# Patient Record
Sex: Female | Born: 1950 | Race: White | Hispanic: No | Marital: Single | Smoking: Current every day smoker
Health system: Southern US, Community
[De-identification: ages and names within clinical notes are randomized; demographics above are authoritative.]

## PROBLEM LIST (undated history)

## (undated) DIAGNOSIS — E7800 Pure hypercholesterolemia, unspecified: Secondary | ICD-10-CM

## (undated) DIAGNOSIS — M549 Dorsalgia, unspecified: Secondary | ICD-10-CM

## (undated) DIAGNOSIS — H9190 Unspecified hearing loss, unspecified ear: Secondary | ICD-10-CM

## (undated) DIAGNOSIS — K5792 Diverticulitis of intestine, part unspecified, without perforation or abscess without bleeding: Secondary | ICD-10-CM

## (undated) DIAGNOSIS — G2581 Restless legs syndrome: Secondary | ICD-10-CM

## (undated) DIAGNOSIS — F419 Anxiety disorder, unspecified: Secondary | ICD-10-CM

## (undated) DIAGNOSIS — Z9109 Other allergy status, other than to drugs and biological substances: Secondary | ICD-10-CM

## (undated) DIAGNOSIS — I10 Essential (primary) hypertension: Secondary | ICD-10-CM

## (undated) HISTORY — PX: ABDOMINAL HYSTERECTOMY: SHX81

## (undated) HISTORY — PX: APPENDECTOMY: SHX54

## (undated) HISTORY — PX: SHOULDER SURGERY: SHX246

## (undated) HISTORY — PX: TONSILLECTOMY: SUR1361

---

## 1998-11-06 ENCOUNTER — Emergency Department (HOSPITAL_COMMUNITY): Admission: EM | Admit: 1998-11-06 | Discharge: 1998-11-06 | Payer: Self-pay | Admitting: Emergency Medicine

## 2000-04-11 ENCOUNTER — Emergency Department (HOSPITAL_COMMUNITY): Admission: EM | Admit: 2000-04-11 | Discharge: 2000-04-11 | Payer: Self-pay | Admitting: Emergency Medicine

## 2000-07-04 ENCOUNTER — Other Ambulatory Visit: Admission: RE | Admit: 2000-07-04 | Discharge: 2000-07-04 | Payer: Self-pay | Admitting: *Deleted

## 2001-01-08 ENCOUNTER — Emergency Department (HOSPITAL_COMMUNITY): Admission: EM | Admit: 2001-01-08 | Discharge: 2001-01-08 | Payer: Self-pay | Admitting: Emergency Medicine

## 2001-01-08 ENCOUNTER — Encounter: Payer: Self-pay | Admitting: Emergency Medicine

## 2001-12-05 ENCOUNTER — Emergency Department (HOSPITAL_COMMUNITY): Admission: EM | Admit: 2001-12-05 | Discharge: 2001-12-05 | Payer: Self-pay | Admitting: Emergency Medicine

## 2002-05-27 ENCOUNTER — Encounter: Payer: Self-pay | Admitting: Emergency Medicine

## 2002-05-27 ENCOUNTER — Emergency Department (HOSPITAL_COMMUNITY): Admission: EM | Admit: 2002-05-27 | Discharge: 2002-05-27 | Payer: Self-pay | Admitting: Emergency Medicine

## 2002-06-23 ENCOUNTER — Encounter: Payer: Self-pay | Admitting: Internal Medicine

## 2002-06-23 ENCOUNTER — Encounter: Admission: RE | Admit: 2002-06-23 | Discharge: 2002-06-23 | Payer: Self-pay | Admitting: Internal Medicine

## 2002-10-08 ENCOUNTER — Encounter: Payer: Self-pay | Admitting: Emergency Medicine

## 2002-10-08 ENCOUNTER — Observation Stay (HOSPITAL_COMMUNITY): Admission: EM | Admit: 2002-10-08 | Discharge: 2002-10-09 | Payer: Self-pay | Admitting: Emergency Medicine

## 2004-04-29 ENCOUNTER — Emergency Department (HOSPITAL_COMMUNITY): Admission: EM | Admit: 2004-04-29 | Discharge: 2004-04-29 | Payer: Self-pay | Admitting: Emergency Medicine

## 2004-08-09 ENCOUNTER — Ambulatory Visit: Payer: Self-pay | Admitting: Internal Medicine

## 2005-01-01 ENCOUNTER — Emergency Department (HOSPITAL_COMMUNITY): Admission: EM | Admit: 2005-01-01 | Discharge: 2005-01-01 | Payer: Self-pay | Admitting: Family Medicine

## 2005-03-21 ENCOUNTER — Encounter (INDEPENDENT_AMBULATORY_CARE_PROVIDER_SITE_OTHER): Payer: Self-pay | Admitting: Nurse Practitioner

## 2005-03-28 ENCOUNTER — Ambulatory Visit: Payer: Self-pay | Admitting: Nurse Practitioner

## 2005-03-28 ENCOUNTER — Ambulatory Visit: Payer: Self-pay | Admitting: *Deleted

## 2005-03-31 ENCOUNTER — Ambulatory Visit (HOSPITAL_COMMUNITY): Admission: RE | Admit: 2005-03-31 | Discharge: 2005-03-31 | Payer: Self-pay | Admitting: Internal Medicine

## 2005-03-31 ENCOUNTER — Ambulatory Visit (HOSPITAL_COMMUNITY): Admission: RE | Admit: 2005-03-31 | Discharge: 2005-03-31 | Payer: Self-pay | Admitting: Family Medicine

## 2005-04-04 ENCOUNTER — Ambulatory Visit: Payer: Self-pay | Admitting: Nurse Practitioner

## 2005-05-16 ENCOUNTER — Ambulatory Visit: Payer: Self-pay | Admitting: Nurse Practitioner

## 2005-06-02 ENCOUNTER — Ambulatory Visit: Payer: Self-pay | Admitting: Nurse Practitioner

## 2005-07-28 ENCOUNTER — Ambulatory Visit: Payer: Self-pay | Admitting: Nurse Practitioner

## 2005-07-28 ENCOUNTER — Ambulatory Visit (HOSPITAL_COMMUNITY): Admission: RE | Admit: 2005-07-28 | Discharge: 2005-07-28 | Payer: Self-pay | Admitting: Internal Medicine

## 2005-11-15 ENCOUNTER — Encounter: Admission: RE | Admit: 2005-11-15 | Discharge: 2005-12-01 | Payer: Self-pay | Admitting: Neurosurgery

## 2006-02-23 ENCOUNTER — Ambulatory Visit: Payer: Self-pay | Admitting: Nurse Practitioner

## 2006-03-20 ENCOUNTER — Ambulatory Visit: Payer: Self-pay | Admitting: Nurse Practitioner

## 2006-05-08 ENCOUNTER — Ambulatory Visit: Payer: Self-pay | Admitting: Nurse Practitioner

## 2006-06-06 ENCOUNTER — Ambulatory Visit: Payer: Self-pay | Admitting: Nurse Practitioner

## 2006-06-27 ENCOUNTER — Emergency Department (HOSPITAL_COMMUNITY): Admission: EM | Admit: 2006-06-27 | Discharge: 2006-06-27 | Payer: Self-pay | Admitting: Emergency Medicine

## 2006-10-03 ENCOUNTER — Ambulatory Visit: Payer: Self-pay | Admitting: Nurse Practitioner

## 2006-10-05 ENCOUNTER — Encounter (INDEPENDENT_AMBULATORY_CARE_PROVIDER_SITE_OTHER): Payer: Self-pay | Admitting: Nurse Practitioner

## 2006-10-05 LAB — CONVERTED CEMR LAB: TSH: 1.402 u[IU]/mL

## 2006-10-30 ENCOUNTER — Ambulatory Visit: Payer: Self-pay | Admitting: Nurse Practitioner

## 2007-04-04 ENCOUNTER — Encounter (INDEPENDENT_AMBULATORY_CARE_PROVIDER_SITE_OTHER): Payer: Self-pay | Admitting: Nurse Practitioner

## 2007-04-04 DIAGNOSIS — E785 Hyperlipidemia, unspecified: Secondary | ICD-10-CM | POA: Insufficient documentation

## 2007-04-04 DIAGNOSIS — M545 Low back pain, unspecified: Secondary | ICD-10-CM | POA: Insufficient documentation

## 2007-04-04 DIAGNOSIS — F329 Major depressive disorder, single episode, unspecified: Secondary | ICD-10-CM | POA: Insufficient documentation

## 2007-04-04 DIAGNOSIS — F411 Generalized anxiety disorder: Secondary | ICD-10-CM | POA: Insufficient documentation

## 2007-04-04 DIAGNOSIS — I10 Essential (primary) hypertension: Secondary | ICD-10-CM | POA: Insufficient documentation

## 2007-04-04 DIAGNOSIS — IMO0002 Reserved for concepts with insufficient information to code with codable children: Secondary | ICD-10-CM | POA: Insufficient documentation

## 2007-04-04 DIAGNOSIS — F3289 Other specified depressive episodes: Secondary | ICD-10-CM | POA: Insufficient documentation

## 2007-05-08 ENCOUNTER — Encounter (INDEPENDENT_AMBULATORY_CARE_PROVIDER_SITE_OTHER): Payer: Self-pay | Admitting: *Deleted

## 2007-08-06 ENCOUNTER — Ambulatory Visit: Payer: Self-pay | Admitting: Family Medicine

## 2007-08-06 ENCOUNTER — Encounter (INDEPENDENT_AMBULATORY_CARE_PROVIDER_SITE_OTHER): Payer: Self-pay | Admitting: Nurse Practitioner

## 2007-08-06 LAB — CONVERTED CEMR LAB
AST: 29 units/L (ref 0–37)
Albumin: 4.7 g/dL (ref 3.5–5.2)
Alkaline Phosphatase: 69 units/L (ref 39–117)
Basophils Absolute: 0 10*3/uL (ref 0.0–0.1)
Basophils Relative: 0 % (ref 0–1)
Eosinophils Absolute: 0.2 10*3/uL (ref 0.2–0.7)
LDL Cholesterol: 122 mg/dL — ABNORMAL HIGH (ref 0–99)
MCHC: 33 g/dL (ref 30.0–36.0)
MCV: 95.7 fL (ref 78.0–100.0)
Neutrophils Relative %: 39 % — ABNORMAL LOW (ref 43–77)
Platelets: 412 10*3/uL — ABNORMAL HIGH (ref 150–400)
Potassium: 3.9 meq/L (ref 3.5–5.3)
RDW: 13.3 % (ref 11.5–15.5)
Sodium: 143 meq/L (ref 135–145)
TSH: 1.294 microintl units/mL (ref 0.350–5.50)
Total Bilirubin: 0.6 mg/dL (ref 0.3–1.2)
Total Protein: 7.4 g/dL (ref 6.0–8.3)
VLDL: 26 mg/dL (ref 0–40)
WBC: 10.7 10*3/uL — ABNORMAL HIGH (ref 4.0–10.5)

## 2007-09-04 ENCOUNTER — Encounter (INDEPENDENT_AMBULATORY_CARE_PROVIDER_SITE_OTHER): Payer: Self-pay | Admitting: Nurse Practitioner

## 2007-09-04 ENCOUNTER — Ambulatory Visit: Payer: Self-pay | Admitting: Family Medicine

## 2008-03-06 ENCOUNTER — Ambulatory Visit: Payer: Self-pay | Admitting: Internal Medicine

## 2008-09-07 ENCOUNTER — Ambulatory Visit (HOSPITAL_COMMUNITY): Admission: RE | Admit: 2008-09-07 | Discharge: 2008-09-07 | Payer: Self-pay | Admitting: Family Medicine

## 2008-10-05 ENCOUNTER — Telehealth: Payer: Self-pay | Admitting: Family Medicine

## 2009-09-20 ENCOUNTER — Ambulatory Visit (HOSPITAL_COMMUNITY): Admission: RE | Admit: 2009-09-20 | Discharge: 2009-09-20 | Payer: Self-pay | Admitting: Family Medicine

## 2010-09-13 ENCOUNTER — Encounter: Payer: Self-pay | Admitting: Orthopedic Surgery

## 2010-09-15 ENCOUNTER — Other Ambulatory Visit (HOSPITAL_COMMUNITY): Payer: Self-pay | Admitting: Family Medicine

## 2010-09-15 DIAGNOSIS — Z139 Encounter for screening, unspecified: Secondary | ICD-10-CM

## 2010-09-22 ENCOUNTER — Ambulatory Visit (HOSPITAL_COMMUNITY)
Admission: RE | Admit: 2010-09-22 | Discharge: 2010-09-22 | Disposition: A | Discharge status: Home / Self Care | Payer: Medicare Other | Source: Ambulatory Visit | Attending: Family Medicine | Admitting: Family Medicine

## 2010-09-22 DIAGNOSIS — Z139 Encounter for screening, unspecified: Secondary | ICD-10-CM

## 2010-09-22 DIAGNOSIS — Z1231 Encounter for screening mammogram for malignant neoplasm of breast: Secondary | ICD-10-CM | POA: Insufficient documentation

## 2010-09-23 ENCOUNTER — Other Ambulatory Visit (HOSPITAL_COMMUNITY): Payer: Self-pay | Admitting: Family Medicine

## 2010-09-28 NOTE — Letter (Signed)
Summary: FMLA form  FMLA form   Imported By: Cammie Sickle 09/23/2010 14:38:15  _____________________________________________________________________  External Attachment:    Type:   Image     Comment:   External Document

## 2010-10-06 ENCOUNTER — Ambulatory Visit (HOSPITAL_COMMUNITY): Admission: RE | Admit: 2010-10-06 | Payer: Medicare Other | Source: Ambulatory Visit

## 2010-10-18 ENCOUNTER — Ambulatory Visit (HOSPITAL_COMMUNITY): Admission: RE | Admit: 2010-10-18 | Payer: Medicare Other | Source: Ambulatory Visit

## 2010-10-20 ENCOUNTER — Inpatient Hospital Stay (HOSPITAL_COMMUNITY): Admission: RE | Admit: 2010-10-20 | Payer: Medicare Other | Source: Ambulatory Visit

## 2011-01-06 NOTE — H&P (Signed)
NAME:  Laura Bradshaw, Laura Bradshaw NO.:  [id number]   MEDICAL RECORD NO.:  [id number]                   PATIENT TYPE:  EMS   LOCATION:  ED                                   FACILITY:  [facility]   PHYSICIAN:  Gordy Savers, M.D. [facility]      DATE OF BIRTH:  07-27-1951   DATE OF ADMISSION:  10/08/2002  DATE OF DISCHARGE:                                HISTORY & PHYSICAL   CHIEF COMPLAINT:  Chest pain.   HISTORY OF PRESENT ILLNESS:  The patient is a 61 year old white female who  was well until 4-5 days ago. At that time, she developed some chest  discomfort late one evening after consuming pizza earlier. She attributed  this to indigestion and took antacids with very little relief. The following  morning, she awoke and felt what she described as achy. She felt that she  had a flu syndrome and began taking Robitussin as well as Tylenol. Later in  the day, she developed discomfort in the lower chest and upper abdominal  area described as a band like sensation. This also spread to involve the  lower back area. The pain at the time was quite uncomfortable and tended to  be aggravating with certain movements such as twisting and bending. She  denied any associated symptoms such as nausea or shortness of breath. Over  the past couple of days, she has had recurrent similar chest pain. On the  day of admission, the patient became quite uncomfortable with more  generalized pain. Again this was described as an intense feeling of  discomfort pressure type sensation involving again more the lower chest,  flank and lower back area. Denied any real substernal chest pain or  associated symptoms. Because of her sense of intense discomfort, she  presented to the emergency department where she did receive two sublingual  nitroglycerin that seemed to improve the pain considerably. At the time of  this interview, the patient had some very minor discomfort in the left lower  back area.  Evaluation included an EKG that was normal and normal initial  cardiac enzymes. The patient is now admitted for further evaluation and  treatment of her atypical chest pain.   The patient denies any known prior history of cardiac disease. She is a  longtime one pack per day cigarette smoker and has been told that perhaps  she had a borderline elevated cholesterol level. There is no history of  hypertension or diabetes.   PAST MEDICAL HISTORY:  The patient has a history of diverticulitis that has  required outpatient antibiotic treatment. Other problems including ongoing  tobacco use.   PAST SURGICAL HISTORY:  Surgical procedures have included remote  hysterectomy in 1984 due to endometriosis. She has had a tonsillectomy at  age 54 and an appendectomy at age 65.   MEDICATIONS:  She takes no chronic medications. Has been taking Robitussin  and antacids for her current illness.  SOCIAL HISTORY:  She presently is unemployed, is accompanied by three  daughters to the emergency department. One pack per day smoker.   FAMILY HISTORY:  Father died at 66 with a history of coronary artery  disease. He had diabetes and died of renal failure. Mother age 16 without  known heart disease. Several other siblings, however, with documented  coronary artery disease. Two brothers and two sisters are living and well.   PHYSICAL EXAMINATION:  VITAL SIGNS:  Revealed a blood pressure of 130/88,  pulse rate 82, respiratory rate 18. Pulse oximetry was 98 on room air.  SKIN:  Warm and dry without rash.  GENERAL:  A well-developed, healthy appearing white female in no acute  distress.  HEENT:  Revealed normal pupil responses, conjunctivae clear. No arcus  senilis noted. Ear, nose and throat normal.  NECK:  No neck vein distention, no bruits.  CHEST:  Clear.  CARDIOVASCULAR:  S1 and S2 are normal with murmurs or gallops.  ABDOMEN:  Soft and nontender, no organomegaly. No epigastric tenderness.  Bowel sounds  were active.  EXTREMITIES:  Revealed full peripheral pulses without edema.  NEUROLOGIC:  Negative.   IMPRESSION:  Atypical chest pain, rule out coronary artery disease.   DISPOSITION:  The patient was admitted to the hospital, serial cardiac  enzymes and EKGs will be reviewed. The patient will be considered for a  stress test on October 09, 2002 and possible early discharge if negative.                                               Gordy Savers, M.D. [facility]    PFK/MEDQ  D:  10/08/2002  T:  10/08/2002  Job:  [id number]

## 2011-11-08 ENCOUNTER — Other Ambulatory Visit (HOSPITAL_COMMUNITY): Payer: Self-pay | Admitting: Nurse Practitioner

## 2011-11-08 ENCOUNTER — Other Ambulatory Visit (HOSPITAL_COMMUNITY): Payer: Self-pay | Admitting: Family Medicine

## 2011-11-08 DIAGNOSIS — Z1231 Encounter for screening mammogram for malignant neoplasm of breast: Secondary | ICD-10-CM

## 2011-11-14 ENCOUNTER — Other Ambulatory Visit (HOSPITAL_COMMUNITY): Payer: Self-pay | Admitting: Nurse Practitioner

## 2011-12-05 ENCOUNTER — Ambulatory Visit (HOSPITAL_COMMUNITY): Payer: Medicare Other | Attending: Nurse Practitioner

## 2011-12-05 ENCOUNTER — Ambulatory Visit (HOSPITAL_COMMUNITY)
Admission: RE | Admit: 2011-12-05 | Payer: Medicare Other | Source: Ambulatory Visit | Attending: Family Medicine | Admitting: Family Medicine

## 2012-09-12 ENCOUNTER — Ambulatory Visit (HOSPITAL_COMMUNITY): Payer: Medicare Other

## 2012-09-19 ENCOUNTER — Ambulatory Visit (HOSPITAL_COMMUNITY): Payer: Medicare Other

## 2012-09-19 ENCOUNTER — Ambulatory Visit (HOSPITAL_COMMUNITY): Admission: RE | Admit: 2012-09-19 | Payer: Medicare Other | Source: Ambulatory Visit

## 2012-10-01 ENCOUNTER — Ambulatory Visit (HOSPITAL_COMMUNITY): Payer: Medicare Other

## 2012-10-01 ENCOUNTER — Ambulatory Visit (HOSPITAL_COMMUNITY): Admission: RE | Admit: 2012-10-01 | Payer: Medicare Other | Source: Ambulatory Visit

## 2013-03-27 ENCOUNTER — Other Ambulatory Visit (HOSPITAL_COMMUNITY): Payer: Self-pay | Admitting: Family Medicine

## 2013-03-27 DIAGNOSIS — Z780 Asymptomatic menopausal state: Secondary | ICD-10-CM

## 2013-03-27 DIAGNOSIS — Z1231 Encounter for screening mammogram for malignant neoplasm of breast: Secondary | ICD-10-CM

## 2013-04-01 ENCOUNTER — Ambulatory Visit (HOSPITAL_COMMUNITY)
Admission: RE | Admit: 2013-04-01 | Discharge: 2013-04-01 | Disposition: A | Discharge status: Home / Self Care | Payer: Medicare Other | Source: Ambulatory Visit | Attending: Family Medicine | Admitting: Family Medicine

## 2013-04-01 DIAGNOSIS — Z780 Asymptomatic menopausal state: Secondary | ICD-10-CM | POA: Insufficient documentation

## 2013-04-01 DIAGNOSIS — Z1231 Encounter for screening mammogram for malignant neoplasm of breast: Secondary | ICD-10-CM

## 2013-04-01 DIAGNOSIS — Z13820 Encounter for screening for osteoporosis: Secondary | ICD-10-CM | POA: Insufficient documentation

## 2014-06-03 ENCOUNTER — Other Ambulatory Visit (HOSPITAL_COMMUNITY): Payer: Self-pay | Admitting: Nurse Practitioner

## 2014-06-03 DIAGNOSIS — Z1231 Encounter for screening mammogram for malignant neoplasm of breast: Secondary | ICD-10-CM

## 2014-06-11 ENCOUNTER — Ambulatory Visit (HOSPITAL_COMMUNITY): Payer: Medicare Other

## 2014-06-16 ENCOUNTER — Ambulatory Visit (HOSPITAL_COMMUNITY)
Admission: RE | Admit: 2014-06-16 | Discharge: 2014-06-16 | Disposition: A | Discharge status: Home / Self Care | Payer: Medicare Other | Source: Ambulatory Visit | Attending: Nurse Practitioner | Admitting: Nurse Practitioner

## 2014-06-16 DIAGNOSIS — Z1231 Encounter for screening mammogram for malignant neoplasm of breast: Secondary | ICD-10-CM

## 2015-10-07 ENCOUNTER — Other Ambulatory Visit: Payer: Self-pay | Admitting: Nurse Practitioner

## 2015-10-07 DIAGNOSIS — Z1231 Encounter for screening mammogram for malignant neoplasm of breast: Secondary | ICD-10-CM

## 2015-10-07 DIAGNOSIS — E2839 Other primary ovarian failure: Secondary | ICD-10-CM

## 2015-11-24 ENCOUNTER — Other Ambulatory Visit: Payer: Medicare Other

## 2015-11-24 ENCOUNTER — Ambulatory Visit: Payer: Medicare Other

## 2016-01-06 ENCOUNTER — Ambulatory Visit
Admission: RE | Admit: 2016-01-06 | Discharge: 2016-01-06 | Disposition: A | Discharge status: Home / Self Care | Payer: Medicare Other | Source: Ambulatory Visit | Attending: Nurse Practitioner | Admitting: Nurse Practitioner

## 2016-01-06 DIAGNOSIS — E2839 Other primary ovarian failure: Secondary | ICD-10-CM

## 2016-01-06 DIAGNOSIS — Z1231 Encounter for screening mammogram for malignant neoplasm of breast: Secondary | ICD-10-CM

## 2017-01-31 ENCOUNTER — Other Ambulatory Visit: Payer: Self-pay | Admitting: Nurse Practitioner

## 2017-01-31 DIAGNOSIS — E2839 Other primary ovarian failure: Secondary | ICD-10-CM

## 2017-01-31 DIAGNOSIS — Z1231 Encounter for screening mammogram for malignant neoplasm of breast: Secondary | ICD-10-CM

## 2017-03-05 ENCOUNTER — Ambulatory Visit
Admission: RE | Admit: 2017-03-05 | Discharge: 2017-03-05 | Disposition: A | Discharge status: Home / Self Care | Payer: Medicare Other | Source: Ambulatory Visit | Attending: Nurse Practitioner | Admitting: Nurse Practitioner

## 2017-03-05 DIAGNOSIS — Z1231 Encounter for screening mammogram for malignant neoplasm of breast: Secondary | ICD-10-CM

## 2017-03-05 DIAGNOSIS — E2839 Other primary ovarian failure: Secondary | ICD-10-CM

## 2018-01-06 ENCOUNTER — Emergency Department (HOSPITAL_BASED_OUTPATIENT_CLINIC_OR_DEPARTMENT_OTHER): Payer: Medicare Other

## 2018-01-06 ENCOUNTER — Emergency Department (HOSPITAL_BASED_OUTPATIENT_CLINIC_OR_DEPARTMENT_OTHER)
Admission: EM | Admit: 2018-01-06 | Discharge: 2018-01-06 | Disposition: A | Discharge status: Home / Self Care | Payer: Medicare Other | Attending: Emergency Medicine | Admitting: Emergency Medicine

## 2018-01-06 ENCOUNTER — Encounter (HOSPITAL_BASED_OUTPATIENT_CLINIC_OR_DEPARTMENT_OTHER): Payer: Self-pay | Admitting: Emergency Medicine

## 2018-01-06 ENCOUNTER — Other Ambulatory Visit: Payer: Self-pay

## 2018-01-06 DIAGNOSIS — I10 Essential (primary) hypertension: Secondary | ICD-10-CM | POA: Insufficient documentation

## 2018-01-06 DIAGNOSIS — J40 Bronchitis, not specified as acute or chronic: Secondary | ICD-10-CM

## 2018-01-06 DIAGNOSIS — M545 Low back pain: Secondary | ICD-10-CM

## 2018-01-06 DIAGNOSIS — Z79899 Other long term (current) drug therapy: Secondary | ICD-10-CM | POA: Insufficient documentation

## 2018-01-06 DIAGNOSIS — F17210 Nicotine dependence, cigarettes, uncomplicated: Secondary | ICD-10-CM | POA: Diagnosis not present

## 2018-01-06 DIAGNOSIS — R05 Cough: Secondary | ICD-10-CM | POA: Diagnosis not present

## 2018-01-06 DIAGNOSIS — E041 Nontoxic single thyroid nodule: Secondary | ICD-10-CM | POA: Diagnosis not present

## 2018-01-06 DIAGNOSIS — Z7982 Long term (current) use of aspirin: Secondary | ICD-10-CM | POA: Diagnosis not present

## 2018-01-06 DIAGNOSIS — R1032 Left lower quadrant pain: Secondary | ICD-10-CM | POA: Diagnosis present

## 2018-01-06 HISTORY — DX: Pure hypercholesterolemia, unspecified: E78.00

## 2018-01-06 HISTORY — DX: Unspecified hearing loss, unspecified ear: H91.90

## 2018-01-06 HISTORY — DX: Essential (primary) hypertension: I10

## 2018-01-06 HISTORY — DX: Other allergy status, other than to drugs and biological substances: Z91.09

## 2018-01-06 HISTORY — DX: Dorsalgia, unspecified: M54.9

## 2018-01-06 HISTORY — DX: Restless legs syndrome: G25.81

## 2018-01-06 HISTORY — DX: Anxiety disorder, unspecified: F41.9

## 2018-01-06 HISTORY — DX: Diverticulitis of intestine, part unspecified, without perforation or abscess without bleeding: K57.92

## 2018-01-06 LAB — COMPREHENSIVE METABOLIC PANEL
ALBUMIN: 4.4 g/dL (ref 3.5–5.0)
ALK PHOS: 63 U/L (ref 38–126)
ALT: 28 U/L (ref 14–54)
AST: 25 U/L (ref 15–41)
Anion gap: 11 (ref 5–15)
BUN: 14 mg/dL (ref 6–20)
CALCIUM: 10.2 mg/dL (ref 8.9–10.3)
CO2: 22 mmol/L (ref 22–32)
CREATININE: 0.71 mg/dL (ref 0.44–1.00)
Chloride: 104 mmol/L (ref 101–111)
GFR calc Af Amer: >60 mL/min (ref >60)
GFR calc non Af Amer: >60 mL/min (ref >60)
GLUCOSE: 110 mg/dL — AB (ref 65–99)
Potassium: 3.7 mmol/L (ref 3.5–5.1)
SODIUM: 137 mmol/L (ref 135–145)
Total Bilirubin: 0.4 mg/dL (ref 0.3–1.2)
Total Protein: 7.2 g/dL (ref 6.5–8.1)

## 2018-01-06 LAB — CBC WITH DIFFERENTIAL/PLATELET
Basophils Absolute: 0 10*3/uL (ref 0.0–0.1)
Basophils Relative: 0 %
EOS ABS: 0.3 10*3/uL (ref 0.0–0.7)
Eosinophils Relative: 3 %
HCT: 40.6 % (ref 36.0–46.0)
HEMOGLOBIN: 14.1 g/dL (ref 12.0–15.0)
Lymphocytes Relative: 28 %
Lymphs Abs: 2.7 10*3/uL (ref 0.7–4.0)
MCH: 32.3 pg (ref 26.0–34.0)
MCHC: 34.7 g/dL (ref 30.0–36.0)
MCV: 93.1 fL (ref 78.0–100.0)
Monocytes Absolute: 0.9 10*3/uL (ref 0.1–1.0)
Monocytes Relative: 9 %
NEUTROS ABS: 5.8 10*3/uL (ref 1.7–7.7)
NEUTROS PCT: 60 %
Platelets: 402 10*3/uL — ABNORMAL HIGH (ref 150–400)
RBC: 4.36 MIL/uL (ref 3.87–5.11)
RDW: 13.2 % (ref 11.5–15.5)
WBC: 9.7 10*3/uL (ref 4.0–10.5)

## 2018-01-06 LAB — URINALYSIS, ROUTINE W REFLEX MICROSCOPIC
BILIRUBIN URINE: NEGATIVE
GLUCOSE, UA: NEGATIVE mg/dL
HGB URINE DIPSTICK: NEGATIVE
Ketones, ur: NEGATIVE mg/dL
Nitrite: NEGATIVE
PH: 7 (ref 5.0–8.0)
Protein, ur: NEGATIVE mg/dL

## 2018-01-06 LAB — LIPASE, BLOOD: Lipase: 25 U/L (ref 11–51)

## 2018-01-06 LAB — URINALYSIS, MICROSCOPIC (REFLEX): RBC / HPF: NONE SEEN RBC/hpf (ref 0–5)

## 2018-01-06 MED ORDER — TRAMADOL HCL 50 MG PO TABS: 50.0000 mg | ORAL_TABLET | Freq: Four times a day (QID) | ORAL | 0 refills | Status: DC | PRN

## 2018-01-06 MED ORDER — DOXYCYCLINE HYCLATE 100 MG PO CAPS: 100.0000 mg | ORAL_CAPSULE | Freq: Two times a day (BID) | ORAL | 0 refills | Status: DC

## 2018-01-06 MED ORDER — IOPAMIDOL (ISOVUE-300) INJECTION 61%
100.0000 mL | Freq: Once | INTRAVENOUS | Status: AC | PRN
  Administered 2018-01-06: 80 mL via INTRAVENOUS

## 2018-01-06 MED ORDER — BENZONATATE 100 MG PO CAPS: 100.0000 mg | ORAL_CAPSULE | Freq: Three times a day (TID) | ORAL | 0 refills | Status: DC

## 2018-01-06 NOTE — Discharge Instructions (Addendum)
Take the medications as prescribed.  Follow-up with your primary care doctor to discuss further evaluation such as possible MRI of your back.  The radiologist also recommended follow up imaging, ultrasound on a 1.7 cm thyroid nodule on the left and repeat CT scan in three months.  Return as needed for worsening symptoms

## 2018-01-06 NOTE — ED Triage Notes (Addendum)
Patient states that she is having left sided lower abdominal pain with hip and back pain - the patient has had "cold" x 1 week and has a cough  - the patient also reports that she feels like "my back is breaking" - patient has more concerns - and has been seen in the last 2 -3 months for all of the same complaints

## 2018-01-06 NOTE — ED Provider Notes (Signed)
[facility] EMERGENCY DEPARTMENT Provider Note   CSN: [id number] Arrival date & time: 01/06/18  1230     History   Chief Complaint Chief Complaint  Patient presents with  . Abdominal Pain    HPI Laura Bradshaw is a 67 y.o. female.  HPI Patient presents to the emergency room for evaluation of abdominal pain.  Patient states she is had pain in the left lower quadrant of her abdomen for the last few months.  Pain is at times sharp.  Whenever she is getting up from a sitting position she feels like she has to stand hunched over for several seconds.  Pain does move towards the groin and down her thigh.  She saw her primary doctor back in March and had a CT scan.  Patient does have a history of diverticulitis so this was performed to evaluate for that.  She was told the results were normal.  Patient was referred to a GI doctor but had a normal colonoscopy in the last year.  Patient also is having some pain in her lower back.  She denies any numbness or weakness in her extremities.  She also mentions having cough and cold over the last week.  She denies any fevers.  No shortness of breath.  No difficulty breathing or swallowing. Past Medical History:  Diagnosis Date  . Anxiousness   . Back pain   . Diverticulitis   . Environmental allergies   . HOH (hard of hearing)   . Hypercholesteremia   . Hypertension   . Restless leg     Patient Active Problem List   Diagnosis Date Noted  . HYPERLIPIDEMIA 04/04/2007  . ANXIETY 04/04/2007  . DEPRESSION 04/04/2007  . HYPERTENSION 04/04/2007  . DEGENERATIVE DISC DISEASE 04/04/2007  . LOW BACK PAIN, CHRONIC 04/04/2007    Past Surgical History:  Procedure Laterality Date  . ABDOMINAL HYSTERECTOMY    . APPENDECTOMY    . SHOULDER SURGERY    . TONSILLECTOMY       OB History   None      Home Medications    Prior to Admission medications   Medication Sig Start Date End Date Taking? Authorizing Provider  ALPRAZolam Prudy Feeler)  0.5 MG tablet Take 0.5 mg by mouth at bedtime as needed for anxiety.   Yes [provider]  aspirin 325 MG tablet Take 325 mg by mouth daily.   Yes [provider]  escitalopram (LEXAPRO) 20 MG tablet Take 20 mg by mouth daily.   Yes [provider]  fexofenadine (ALLEGRA) 180 MG tablet Take 180 mg by mouth daily.   Yes [provider]  gabapentin (NEURONTIN) 300 MG capsule Take 300 mg by mouth 3 (three) times daily.   Yes [provider]  hydrochlorothiazide (HYDRODIURIL) 25 MG tablet Take 25 mg by mouth daily.   Yes [provider]  lisinopril (PRINIVIL,ZESTRIL) 20 MG tablet Take 20 mg by mouth daily.   Yes [provider]  montelukast (SINGULAIR) 10 MG tablet Take 10 mg by mouth at bedtime.   Yes [provider]  naproxen (NAPROSYN) 500 MG tablet Take 500 mg by mouth 2 (two) times daily with a meal.   Yes [provider]  simvastatin (ZOCOR) 40 MG tablet Take 40 mg by mouth daily.   Yes [provider]  benzonatate (TESSALON) 100 MG capsule Take 1 capsule (100 mg total) by mouth every 8 (eight) hours. 01/06/18   Linwood Dibbles, MD  doxycycline (VIBRAMYCIN) 100 MG  capsule Take 1 capsule (100 mg total) by mouth 2 (two) times daily. 01/06/18   Linwood Dibbles, MD  traMADol (ULTRAM) 50 MG tablet Take 1 tablet (50 mg total) by mouth every 6 (six) hours as needed. 01/06/18   Linwood Dibbles, MD    Family History History reviewed. No pertinent family history.  Social History Social History   Tobacco Use  . Smoking status: Current Every Day Smoker  . Smokeless tobacco: Never Used  Substance Use Topics  . Alcohol use: Never    Frequency: Never  . Drug use: Never     Allergies   Codeine and Sulfonamide derivatives   Review of Systems Review of Systems  All other systems reviewed and are negative.    Physical Exam Updated Vital Signs BP 136/83 (BP Location: Left Arm)   Pulse 85   Temp 98.4 F (36.9 C)  (Oral)   Resp 18   Ht 1.6 m ( )   Wt 68 kg (150 lb)   SpO2 100%   BMI 26.57 kg/m   Physical Exam  Constitutional: She appears well-developed and well-nourished. No distress.  HENT:  Head: Normocephalic and atraumatic.  Right Ear: External ear normal.  Left Ear: External ear normal.  Eyes: Conjunctivae are normal. Right eye exhibits no discharge. Left eye exhibits no discharge. No scleral icterus.  Neck: Neck supple. No tracheal deviation present.  Cardiovascular: Normal rate, regular rhythm and intact distal pulses.  Pulmonary/Chest: Effort normal and breath sounds normal. No stridor. No respiratory distress. She has no wheezes. She has no rales.  Abdominal: Soft. Bowel sounds are normal. She exhibits no distension. There is tenderness in the left lower quadrant. There is no rebound and no guarding.  Tenderness palpation of the left inguinal and left lower quadrant region, no mass or abnormality palpated  Musculoskeletal: She exhibits no edema or tenderness.  No spinal tenderness, no mass  Neurological: She is alert. She has normal strength. No cranial nerve deficit (no facial droop, extraocular movements intact, no slurred speech) or sensory deficit. She exhibits normal muscle tone. She displays no seizure activity. Coordination normal.  Skin: Skin is warm and dry. No rash noted.  Psychiatric: She has a normal mood and affect.  Nursing note and vitals reviewed.    ED Treatments / Results  Labs (all labs ordered are listed, but only abnormal results are displayed) Labs Reviewed  CBC WITH DIFFERENTIAL/PLATELET - Abnormal; Notable for the following components:      Result Value   Platelets 402 (*)    All other components within normal limits  COMPREHENSIVE METABOLIC PANEL - Abnormal; Notable for the following components:   Glucose, Bld 110 (*)    All other components within normal limits  URINALYSIS, ROUTINE W REFLEX MICROSCOPIC - Abnormal; Notable for the following  components:   Specific Gravity, Urine <1.005 (*)    Leukocytes, UA SMALL (*)    All other components within normal limits  URINALYSIS, MICROSCOPIC (REFLEX) - Abnormal; Notable for the following components:   Bacteria, UA FEW (*)    All other components within normal limits  LIPASE, BLOOD     Radiology Dg Chest 2 View  Result Date: 01/06/2018 CLINICAL DATA:  Patient with lower back and abdominal pain. EXAM: CHEST - 2 VIEW COMPARISON:  None. FINDINGS: Cardiac contours upper limits of normal. Irregular masslike opacity within the right lower lung. No pleural effusion or pneumothorax. Thoracic spine degenerative changes. IMPRESSION: Irregular masslike opacity within the right lower lung is nonspecific and  may represent pneumonia. Pulmonary malignancy is a consideration. Recommend further evaluation with contrast-enhanced chest CT. Electronically Signed   By: Annia Belt M.D.   On: 01/06/2018 13:52   Dg Lumbar Spine Complete  Result Date: 01/06/2018 CLINICAL DATA:  Abdominal and lower back pain. EXAM: LUMBAR SPINE - COMPLETE 4+ VIEW COMPARISON:  None. FINDINGS: Rightward curvature of the lumbar spine. Preservation of the vertebral body heights. Multilevel degenerative disc disease. Multilevel lower lumbar spine facet degenerative changes. No evidence for acute process. SI joints are unremarkable. IMPRESSION: Lower lumbar spine degenerative disc and facet disease. Electronically Signed   By: Annia Belt M.D.   On: 01/06/2018 13:56   Ct Chest W Contrast  Result Date: 01/06/2018 CLINICAL DATA:  67 year old female with cough and congestion for 1 week. Masslike opacity overlying the RIGHT LOWER lung on recent chest radiograph EXAM: CT CHEST WITH CONTRAST TECHNIQUE: Multidetector CT imaging of the chest was performed during intravenous contrast administration. CONTRAST:  80mL ISOVUE-300 IOPAMIDOL (ISOVUE-300) INJECTION 61% COMPARISON:  01/06/2018 chest radiograph. FINDINGS: Cardiovascular: UPPER limits  normal heart size noted. Coronary artery and thoracic aortic atherosclerotic calcifications noted. There is no evidence of thoracic aortic aneurysm or pericardial effusion. Mediastinum/Nodes: No enlarged mediastinal, hilar, or axillary lymph nodes. Bilateral thyroid nodules are noted with a LEFT thyroid nodule measuring 1.7 cm. The trachea and esophagus demonstrate no significant findings. Lungs/Pleura: A 2.7 x 3.5 cm confluent RIGHT middle lobe opacity has the appearance of atelectasis as there is mucous within RIGHT middle lobe bronchi. Infection is considered less likely. Malignancy is considered much less likely. The lungs are otherwise clear. No other mass, nodule, airspace disease, pleural effusion or pneumothorax noted. Upper Abdomen: Hepatic steatosis identified.  No acute abnormality. Musculoskeletal: No acute or suspicious bony abnormality. IMPRESSION: 1. 2.7 x 3.5 cm confluent RIGHT middle lobe opacity likely representing atelectasis given mucous filling RIGHT middle lobe bronchi. Infection is considered less likely and malignancy is considered much less likely. Radiographic/CT follow-up in 3 months is recommended. 2. Bilateral thyroid nodules with a LEFT thyroid nodule measuring 1.7 cm. Consider further evaluation with thyroid ultrasound. If patient is clinically hyperthyroid, consider nuclear medicine thyroid uptake and scan. 3. Hepatic steatosis 4. Coronary artery and Aortic Atherosclerosis (ICD10-I70.0). Electronically Signed   By: Harmon Pier M.D.   On: 01/06/2018 15:04    Procedures Procedures (including critical care time)  Medications Ordered in ED Medications  iopamidol (ISOVUE-300) 61 % injection 100 mL (80 mLs Intravenous Contrast Given 01/06/18 1434)     Initial Impression / Assessment and Plan / ED Course  I have reviewed the triage vital signs and the nursing notes.  Pertinent labs & imaging results that were available during my care of the patient were reviewed by me and  considered in my medical decision making (see chart for details).  Clinical Course as of Jan 06 1549  Sun Jan 06, 2018  1308 CT abdomen pelvis performed March 15 of this year.  Results reviewed in care everywhere.  No acute abnormality noted.   [JK]  1422 Labs reassuring.  Doubt acute abdominal process with her chronicity and the normal labs.  CXR with possible mass.  Pt does smoke.  Will CT Chest.   [JK]    Clinical Course User Index [JK] Linwood Dibbles, MD    Pt has had lower abdominal pain for several months.  She has had previous workup including CT scan.  Pt has not had any other GI sx.  Doubt this is related to  diverticulitis, or other abdominal process.  She has some lower back pain now and features suggesting possible lumbar radiculopathy.  Discussed outpatient follow up, consider MRI.  Pt also mentions cough.  CXR with possible lung mass.  CT suggests mucoid filling and not malignancy.  Follow up CT scan in three months.  Discussed these findings with the patient. Will rx doxy for possible infection considering her acute cough, uri sx.  Final Clinical Impressions(s) / ED Diagnoses   Final diagnoses:  Bronchitis  Acute low back pain, unspecified back pain laterality, with sciatica presence unspecified  Thyroid nodule    ED Discharge Orders        Ordered    traMADol (ULTRAM) 50 MG tablet  Every 6 hours PRN     01/06/18 1535    doxycycline (VIBRAMYCIN) 100 MG capsule  2 times daily     01/06/18 1535    benzonatate (TESSALON) 100 MG capsule  Every 8 hours     01/06/18 1535       Linwood Dibbles, MD 01/06/18 1550

## 2018-01-06 NOTE — ED Notes (Signed)
Rx x 3 given from tramadol, doxycycline, and tessalon. D/c home with family. Pt ambulated to d/c window

## 2020-01-01 IMAGING — CT CT CHEST W/ CM
2 of 3 series · 15 of 36 positions shown, 18 images · IV contrast (iopamidol)
Comparison: 01/06/2018 chest radiograph.

CLINICAL DATA: 67-year-old female with cough and congestion for 1
week. Masslike opacity overlying the RIGHT LOWER lung on recent
chest radiograph

EXAM:
CT CHEST WITH CONTRAST
TECHNIQUE: Multidetector CT imaging of the chest was performed during
intravenous contrast administration.
CONTRAST:  80mL ASNYWG-MAA IOPAMIDOL (ASNYWG-MAA) INJECTION 61%

[Series 2: axial st · axial · 0.77mm/px · z∈[-293,-37]mm · 12 of 152 slices shown, 15 images]
[im 12/152  mediastinal]
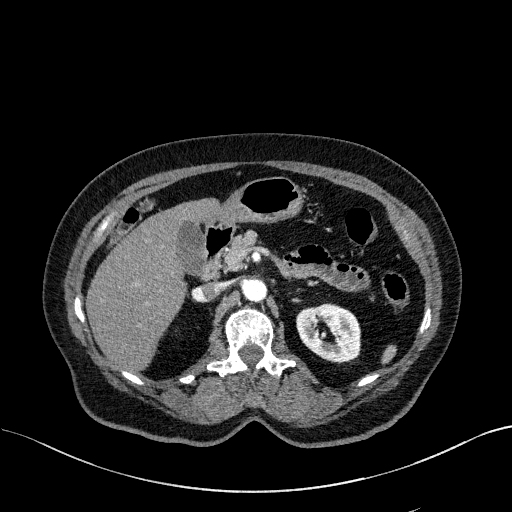
[im 12/152  lung]
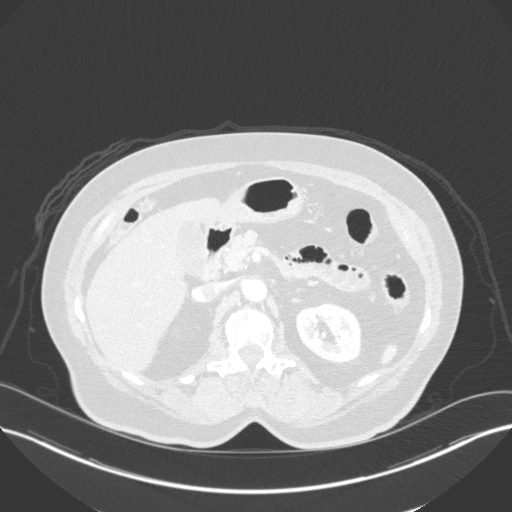
[im 23/152  lung]
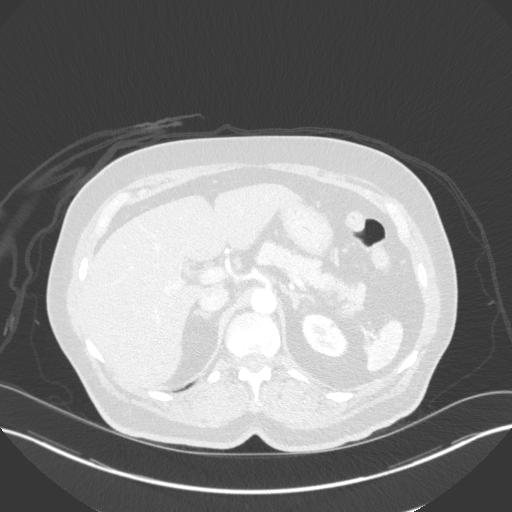
[im 34/152  lung]
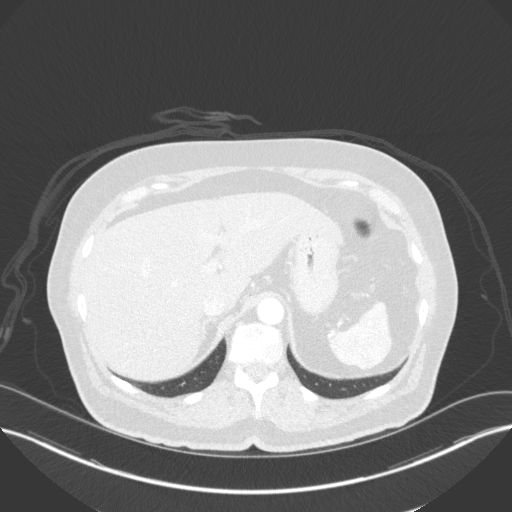
[im 45/152  lung]
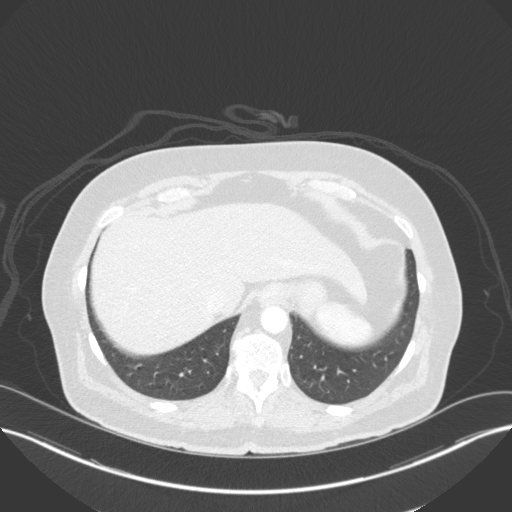
[im 56/152  mediastinal]
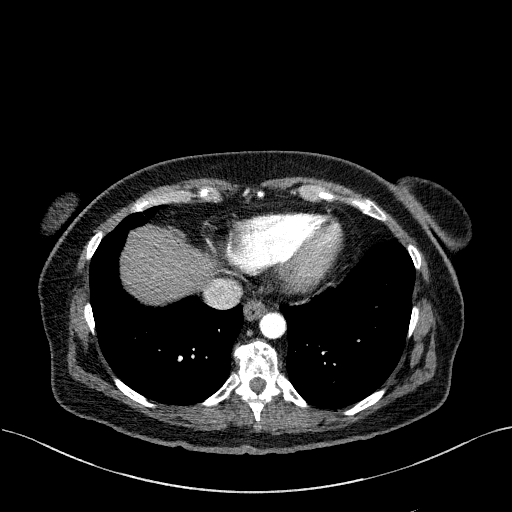
[im 56/152  lung]
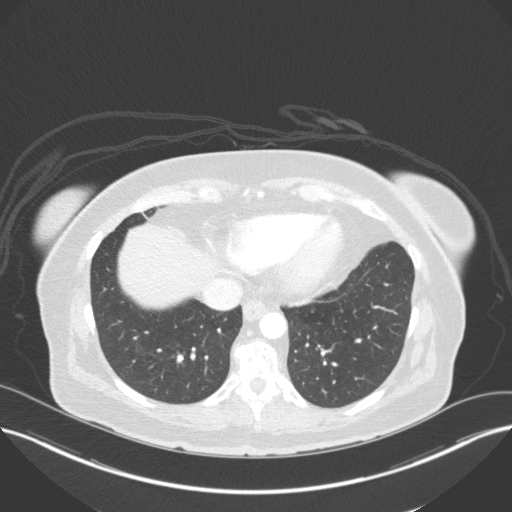
[im 68/152  lung]
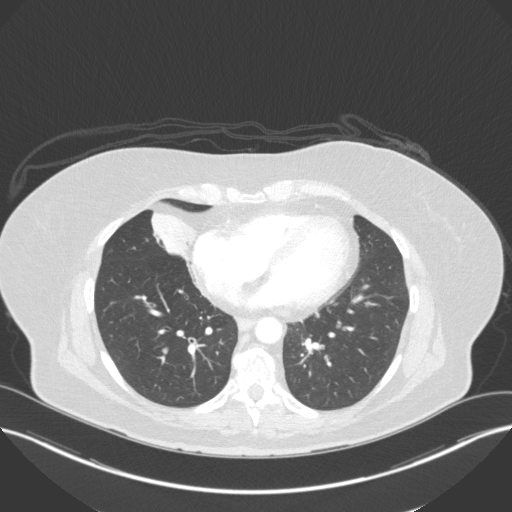
[im 84/152  lung]
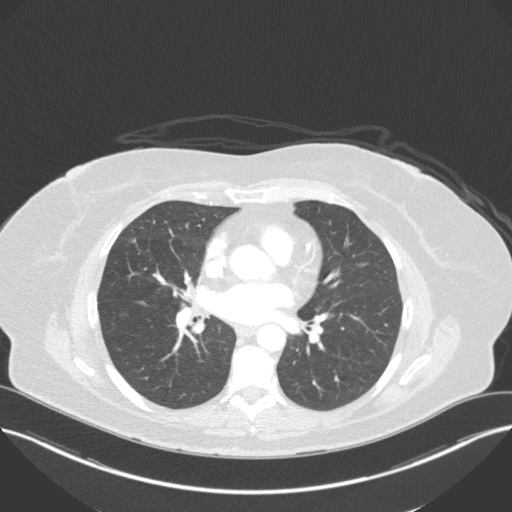
[im 96/152  lung]
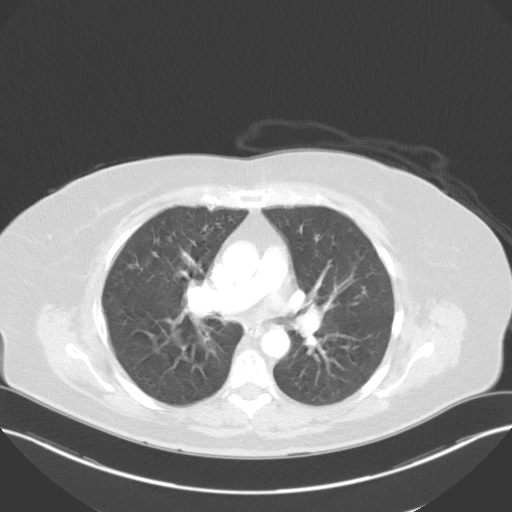
[im 107/152  mediastinal]
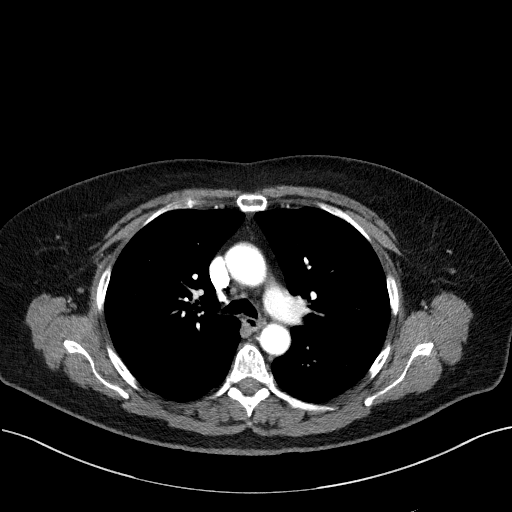
[im 107/152  lung]
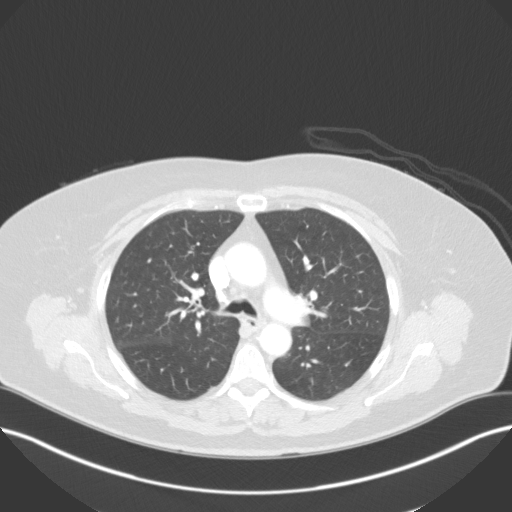
[im 118/152  lung]
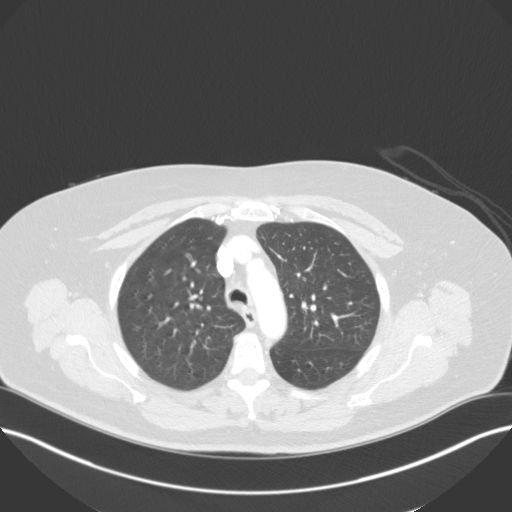
[im 129/152  lung]
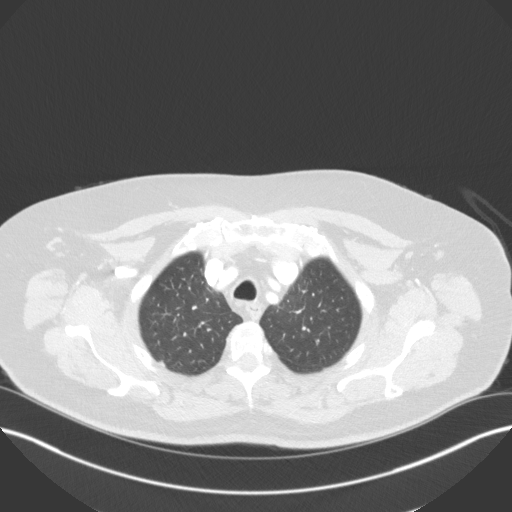
[im 140/152  lung]
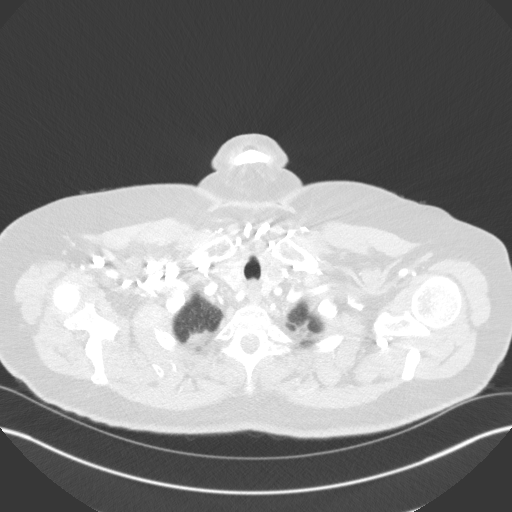

[Series 5: coronal · coronal · 0.59mm/px · 3 of 151 slices shown]
[im 31/151  lung]
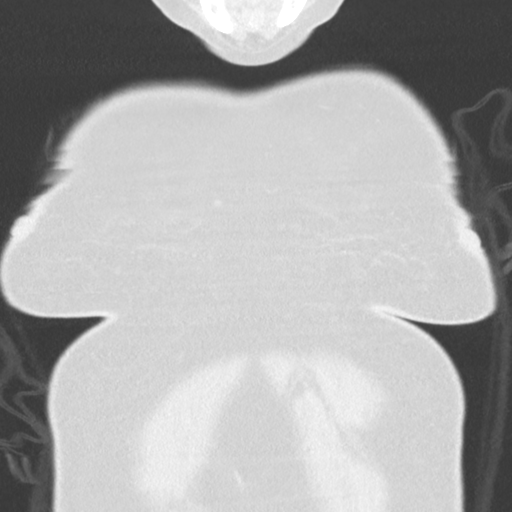
[im 61/151  lung]
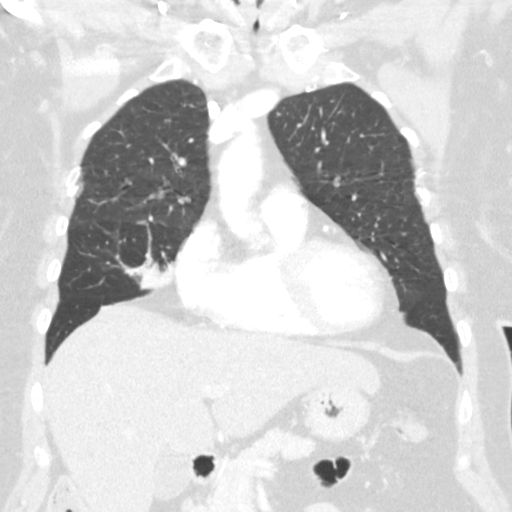
[im 91/151  lung]
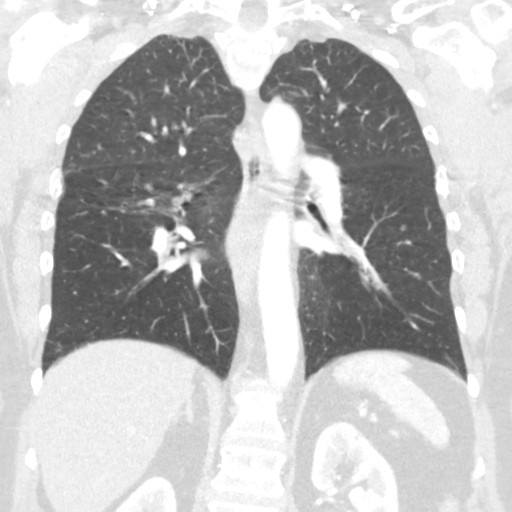

[15 of 36 positions shown; findings below may reference images not displayed]

FINDINGS: Cardiovascular: UPPER limits normal heart size noted. Coronary
artery and thoracic aortic atherosclerotic calcifications noted.
There is no evidence of thoracic aortic aneurysm or pericardial
effusion.

Mediastinum/Nodes: No enlarged mediastinal, hilar, or axillary lymph
nodes. Bilateral thyroid nodules are noted with a LEFT thyroid
nodule measuring 1.7 cm. The trachea and esophagus demonstrate no
significant findings.

Lungs/Pleura: A 2.7 x 3.5 cm confluent RIGHT middle lobe opacity has
the appearance of atelectasis as there is mucous within RIGHT middle
lobe bronchi. Infection is considered less likely.. Malignancy is
considered much less likely.

The lungs are otherwise clear. No other mass, nodule, airspace
disease, pleural effusion or pneumothorax noted.

Upper Abdomen: Hepatic steatosis identified.  No acute abnormality.

Musculoskeletal: No acute or suspicious bony abnormality.
IMPRESSION: 1. 2.7 x 3.5 cm confluent RIGHT middle lobe opacity likely
representing atelectasis given mucous filling RIGHT middle lobe
bronchi. Infection is considered less likely and malignancy is
considered much less likely. Radiographic/CT follow-up in 3 months
is recommended.
2. Bilateral thyroid nodules with a LEFT thyroid nodule measuring
1.7 cm. Consider further evaluation with thyroid ultrasound. If
patient is clinically hyperthyroid, consider nuclear medicine
thyroid uptake and scan.
3. Hepatic steatosis
4. Coronary artery and Aortic Atherosclerosis ([id number]-VDX.X).

## 2021-05-31 ENCOUNTER — Encounter (HOSPITAL_BASED_OUTPATIENT_CLINIC_OR_DEPARTMENT_OTHER): Payer: Self-pay | Admitting: *Deleted

## 2021-05-31 ENCOUNTER — Other Ambulatory Visit: Payer: Self-pay

## 2021-05-31 ENCOUNTER — Emergency Department (HOSPITAL_BASED_OUTPATIENT_CLINIC_OR_DEPARTMENT_OTHER): Payer: Medicare HMO | Admitting: Radiology

## 2021-05-31 ENCOUNTER — Emergency Department (HOSPITAL_BASED_OUTPATIENT_CLINIC_OR_DEPARTMENT_OTHER)
Admission: EM | Admit: 2021-05-31 | Discharge: 2021-05-31 | Disposition: A | Discharge status: Home / Self Care | Payer: Medicare HMO | Attending: Emergency Medicine | Admitting: Emergency Medicine

## 2021-05-31 DIAGNOSIS — Z23 Encounter for immunization: Secondary | ICD-10-CM | POA: Insufficient documentation

## 2021-05-31 DIAGNOSIS — S61011A Laceration without foreign body of right thumb without damage to nail, initial encounter: Secondary | ICD-10-CM | POA: Insufficient documentation

## 2021-05-31 DIAGNOSIS — W269XXA Contact with unspecified sharp object(s), initial encounter: Secondary | ICD-10-CM | POA: Diagnosis not present

## 2021-05-31 DIAGNOSIS — Y9389 Activity, other specified: Secondary | ICD-10-CM | POA: Insufficient documentation

## 2021-05-31 DIAGNOSIS — S6991XA Unspecified injury of right wrist, hand and finger(s), initial encounter: Secondary | ICD-10-CM | POA: Diagnosis present

## 2021-05-31 DIAGNOSIS — Z79899 Other long term (current) drug therapy: Secondary | ICD-10-CM | POA: Diagnosis not present

## 2021-05-31 DIAGNOSIS — Z7982 Long term (current) use of aspirin: Secondary | ICD-10-CM | POA: Diagnosis not present

## 2021-05-31 DIAGNOSIS — F17210 Nicotine dependence, cigarettes, uncomplicated: Secondary | ICD-10-CM | POA: Diagnosis not present

## 2021-05-31 DIAGNOSIS — I10 Essential (primary) hypertension: Secondary | ICD-10-CM | POA: Insufficient documentation

## 2021-05-31 MED ORDER — TETANUS-DIPHTH-ACELL PERTUSSIS 5-2.5-18.5 LF-MCG/0.5 IM SUSY
0.5000 mL | PREFILLED_SYRINGE | Freq: Once | INTRAMUSCULAR | Status: AC
  Administered 2021-05-31: 0.5 mL via INTRAMUSCULAR
  Filled 2021-05-31: qty 0.5

## 2021-05-31 MED ORDER — LIDOCAINE HCL (PF) 1 % IJ SOLN
5.0000 mL | Freq: Once | INTRAMUSCULAR | Status: AC
  Administered 2021-05-31: 5 mL
  Filled 2021-05-31: qty 5

## 2021-05-31 NOTE — Discharge Instructions (Addendum)
Follow-up with your primary care doctor in 10 to 14 days to have the sutures removed. Return if you have any signs of infection such as fever, purulent material, spreading redness. Wash with soap and water.  He can use bacitracin cream as needed. Return if things change or worsen

## 2021-05-31 NOTE — ED Provider Notes (Signed)
[facility] EMERGENCY DEPT Provider Note   CSN: [id number] Arrival date & time: 05/31/21  1607     History Chief Complaint  Patient presents with   Laceration    Laura Bradshaw is a 70 y.o. female.  HPI  Patient presents with right thumb laceration.  This happened acutely while she was opening a can of tomatoes earlier today.  Bleeding is controlled, patient is not on any blood thinners.  There is associated tenderness, no associated numbness.  He has not tried any alleviating factors, moving the digit is negative factor.  Past Medical History:  Diagnosis Date   Anxiousness    Back pain    Diverticulitis    Environmental allergies    HOH (hard of hearing)    Hypercholesteremia    Hypertension    Restless leg     Patient Active Problem List   Diagnosis Date Noted   HYPERLIPIDEMIA 04/04/2007   ANXIETY 04/04/2007   DEPRESSION 04/04/2007   HYPERTENSION 04/04/2007   DEGENERATIVE DISC DISEASE 04/04/2007   LOW BACK PAIN, CHRONIC 04/04/2007    Past Surgical History:  Procedure Laterality Date   ABDOMINAL HYSTERECTOMY     APPENDECTOMY     SHOULDER SURGERY     TONSILLECTOMY       OB History   No obstetric history on file.     No family history on file.  Social History   Tobacco Use   Smoking status: Every Day    Types: Cigarettes   Smokeless tobacco: Never  Vaping Use   Vaping Use: Never used  Substance Use Topics   Alcohol use: Never   Drug use: Never    Home Medications Prior to Admission medications   Medication Sig Start Date End Date Taking? Authorizing Provider  ALPRAZolam Prudy Feeler) 0.5 MG tablet Take 0.5 mg by mouth at bedtime as needed for anxiety.    [provider]  aspirin 325 MG tablet Take 325 mg by mouth daily.    [provider]  benzonatate (TESSALON) 100 MG capsule Take 1 capsule (100 mg total) by mouth every 8 (eight) hours. 01/06/18   Linwood Dibbles, MD  doxycycline (VIBRAMYCIN) 100 MG capsule Take 1  capsule (100 mg total) by mouth 2 (two) times daily. 01/06/18   Linwood Dibbles, MD  escitalopram (LEXAPRO) 20 MG tablet Take 20 mg by mouth daily.    [provider]  fexofenadine (ALLEGRA) 180 MG tablet Take 180 mg by mouth daily.    [provider]  gabapentin (NEURONTIN) 300 MG capsule Take 300 mg by mouth 3 (three) times daily.    [provider]  hydrochlorothiazide (HYDRODIURIL) 25 MG tablet Take 25 mg by mouth daily.    [provider]  lisinopril (PRINIVIL,ZESTRIL) 20 MG tablet Take 20 mg by mouth daily.    [provider]  montelukast (SINGULAIR) 10 MG tablet Take 10 mg by mouth at bedtime.    [provider]  naproxen (NAPROSYN) 500 MG tablet Take 500 mg by mouth 2 (two) times daily with a meal.    [provider]  simvastatin (ZOCOR) 40 MG tablet Take 40 mg by mouth daily.    [provider]  traMADol (ULTRAM) 50 MG tablet Take 1 tablet (50 mg total) by mouth every 6 (six) hours as needed. 01/06/18   Linwood Dibbles, MD    Allergies    Codeine and Sulfonamide derivatives  Review of Systems   Review of Systems  Constitutional:  Negative for fever.  Skin:  Positive for wound.   Physical Exam Updated Vital Signs BP 122/72 (BP Location: Left Arm)   Pulse 85   Temp 97.9 F (36.6 C)   Resp 15   Ht 5\' 1"  (1.549 m)   Wt 68 kg   SpO2 92%   BMI 28.34 kg/m   Physical Exam Vitals and nursing note reviewed. Exam conducted with a chaperone present.  Constitutional:      General: She is not in acute distress.    Appearance: Normal appearance.  HENT:     Head: Normocephalic and atraumatic.  Eyes:     General: No scleral icterus.    Extraocular Movements: Extraocular movements intact.     Pupils: Pupils are equal, round, and reactive to light.  Cardiovascular:     Comments: Radial pulse 2+ Musculoskeletal:        General: Tenderness and signs of injury present. Normal range of motion.     Comments: Full range of  motion, see attached photo  Skin:    Capillary Refill: Capillary refill takes less than 2 seconds.     Coloration: Skin is not jaundiced.     Findings: Lesion present.  Neurological:     Mental Status: She is alert. Mental status is at baseline.     Coordination: Coordination normal.     Comments: Sensation to light touch grossly intact     ED Results / Procedures / Treatments   Labs (all labs ordered are listed, but only abnormal results are displayed) Labs Reviewed - No data to display  EKG None  Radiology DG Finger Thumb Right  Result Date: 05/31/2021 CLINICAL DATA:  Laceration. an of tomato's today cutting rt thumb. Approx 1 cm vertical cut Laceration on anterior side of right thumb. EXAM: RIGHT THUMB 2+V COMPARISON:  None. FINDINGS: There is no evidence of fracture or dislocation. Degenerative changes of the first metacarpophalangeal joint. No aggressive appearing focal bone abnormality. Soft tissue defect noted along the distal volar first digit. No retained radiopaque foreign body. IMPRESSION: 1. No retained radiopaque foreign body. 2.  No acute displaced fracture or dislocation. Electronically Signed   By: 07/31/2021 M.D.   On: 05/31/2021 17:24    Procedures .12/11/2022Laceration Repair  Date/Time: 05/31/2021 8:31 PM Performed by: 07/31/2021, PA-C Authorized by: Theron Arista, PA-C   Consent:    Consent obtained:  Verbal   Consent given by:  Patient   Risks discussed:  Infection, need for additional repair, pain, poor cosmetic result and poor wound healing   Alternatives discussed:  No treatment and delayed treatment Universal protocol:    Procedure explained and questions answered to patient or proxy's satisfaction: yes     Relevant documents present and verified: yes     Test results available: yes     Imaging studies available: yes     Required blood products, implants, devices, and special equipment available: yes     Site/side marked: yes     Immediately prior to  procedure, a time out was called: yes     Patient identity confirmed:  Verbally with patient Anesthesia:    Anesthesia method:  Local infiltration Laceration details:    Location:  Finger   Finger location:  R thumb   Length (cm):  4   Depth (mm):  2 Exploration:    Contaminated: no   Treatment:    Area cleansed with:  Chlorhexidine   Amount of cleaning:  Standard   Irrigation solution:  Sterile saline  Irrigation volume:  1 L   Irrigation method:  Pressure wash   Visualized foreign bodies/material removed: no   Skin repair:    Repair method:  Sutures   Suture size:  4-0   Suture material:  Prolene   Suture technique:  Simple interrupted   Number of sutures:  10 Approximation:    Approximation:  Close Post-procedure details:    Dressing:  Antibiotic ointment and non-adherent dressing   Procedure completion:  Tolerated well, no immediate complications   Medications Ordered in ED Medications  lidocaine (PF) (XYLOCAINE) 1 % injection 5 mL (has no administration in time range)    ED Course  I have reviewed the triage vital signs and the nursing notes.  Pertinent labs & imaging results that were available during my care of the patient were reviewed by me and considered in my medical decision making (see chart for details).    MDM Rules/Calculators/A&P                           Radiograph negative for any retained foreign bodies.  Tetanus boosted.  Patient is neurovascularly intact, no nailbed involvement.  No visualization of tendons.  Suspect superficial laceration, will proceed with repair.     Patient tolerated procedure well, reexamination afterwards showed intact cap refill as well as lesser radial pulse as well as intact sensation.  We will have her follow-up with her primary care doctor.  Return precautions given, discussed signs of infection.  Stable for discharge at this time.  Final Clinical Impression(s) / ED Diagnoses Final diagnoses:  None    Rx / DC  Orders ED Discharge Orders     None        Theron Arista, Cordelia Poche 05/31/21 2033    Benjiman Core, MD 05/31/21 2356

## 2021-05-31 NOTE — ED Triage Notes (Signed)
Pt opening a can of tomato's today cutting rt thumb. Approx 1 cm vertical cut.

## 2021-05-31 NOTE — ED Notes (Signed)
This RN presented the AVS utilizing Teachback Method. Patient verbalizes understanding of Discharge Instructions. Opportunity for Questioning and Answers were provided. Patient Discharged from ED ambulatory to Home via SELF  

## 2023-05-26 IMAGING — DX DG FINGER THUMB 2+V*R*
3 series · 3 of 3 positions shown · non-contrast
Comparison: None.

CLINICAL DATA: Laceration. an of tomato's today cutting rt thumb.
Approx 1 cm vertical cut Laceration on anterior side of right thumb.

EXAM:
RIGHT THUMB 2+V

[finger ap]
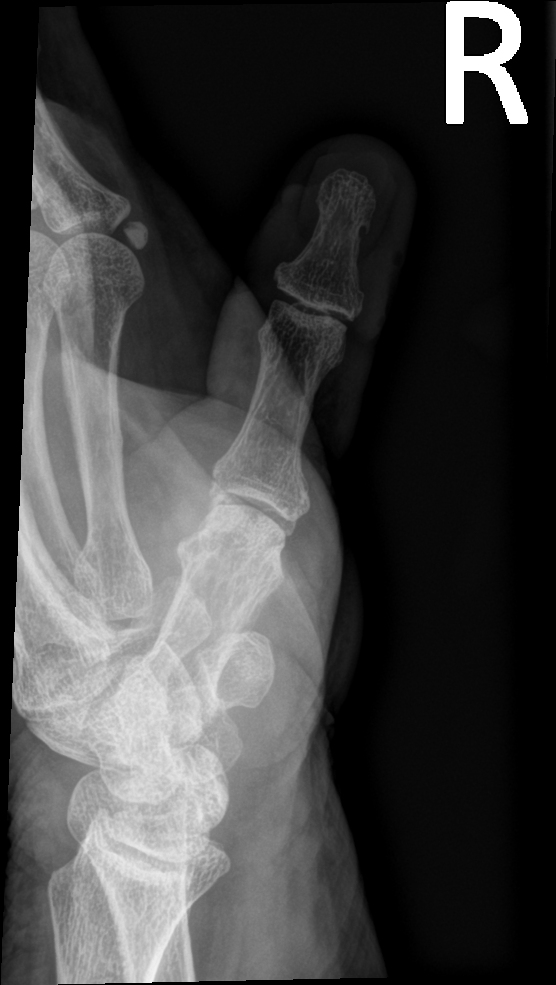

[finger obl]
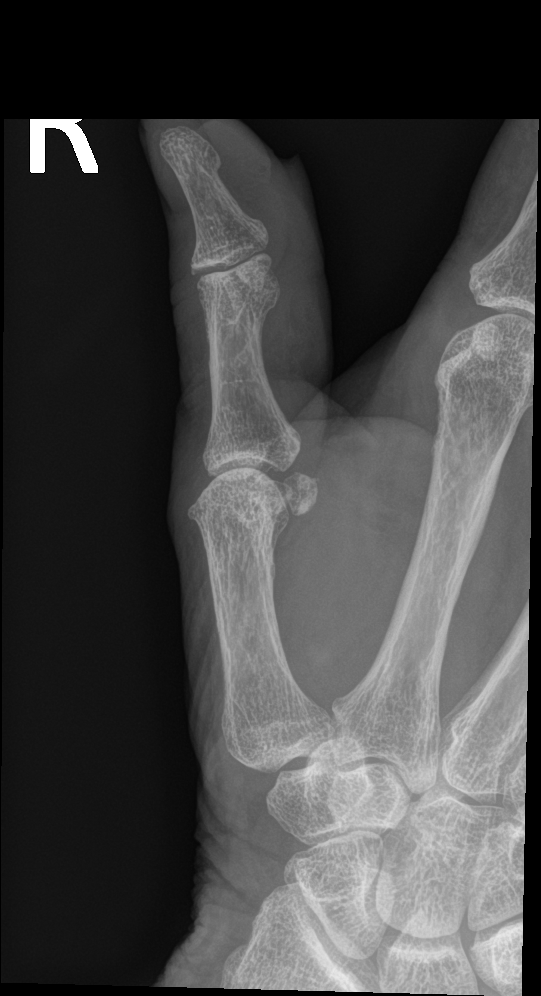

[finger lat]
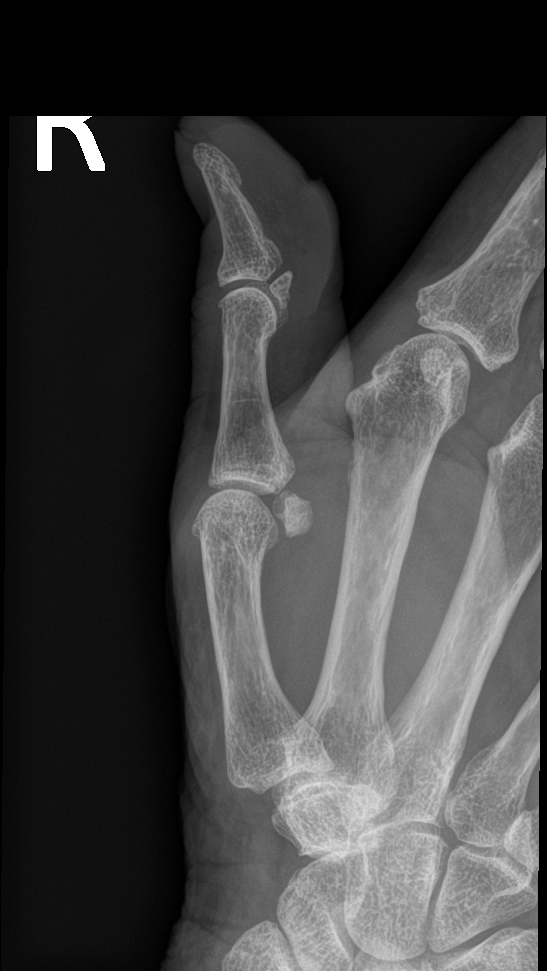

[3 of 3 positions shown; findings below may reference images not displayed]

FINDINGS: There is no evidence of fracture or dislocation. Degenerative
changes of the first metacarpophalangeal joint. No aggressive
appearing focal bone abnormality. Soft tissue defect noted along the
distal volar first digit. No retained radiopaque foreign body.
IMPRESSION: 1. No retained radiopaque foreign body.
2.  No acute displaced fracture or dislocation.

## 2023-07-26 ENCOUNTER — Inpatient Hospital Stay (HOSPITAL_COMMUNITY): Payer: Medicare PPO | Admitting: Anesthesiology

## 2023-07-26 ENCOUNTER — Encounter (HOSPITAL_COMMUNITY): Payer: Self-pay

## 2023-07-26 ENCOUNTER — Other Ambulatory Visit: Payer: Self-pay

## 2023-07-26 ENCOUNTER — Encounter (HOSPITAL_COMMUNITY)
Admission: EM | Disposition: A | Discharge status: Home Health | Payer: Self-pay | Source: Home / Self Care | Attending: Internal Medicine

## 2023-07-26 ENCOUNTER — Emergency Department (HOSPITAL_COMMUNITY): Payer: Medicare PPO

## 2023-07-26 ENCOUNTER — Inpatient Hospital Stay (HOSPITAL_COMMUNITY)
Admission: EM | Admit: 2023-07-26 | Discharge: 2023-08-12 | DRG: 329 | Disposition: A | Discharge status: Home Health | Payer: Medicare PPO | Attending: Internal Medicine | Admitting: Internal Medicine

## 2023-07-26 DIAGNOSIS — Z5331 Laparoscopic surgical procedure converted to open procedure: Secondary | ICD-10-CM

## 2023-07-26 DIAGNOSIS — E440 Moderate protein-calorie malnutrition: Secondary | ICD-10-CM | POA: Diagnosis present

## 2023-07-26 DIAGNOSIS — K56609 Unspecified intestinal obstruction, unspecified as to partial versus complete obstruction: Secondary | ICD-10-CM | POA: Diagnosis present

## 2023-07-26 DIAGNOSIS — K56601 Complete intestinal obstruction, unspecified as to cause: Secondary | ICD-10-CM

## 2023-07-26 DIAGNOSIS — F32A Depression, unspecified: Secondary | ICD-10-CM | POA: Diagnosis present

## 2023-07-26 DIAGNOSIS — G2581 Restless legs syndrome: Secondary | ICD-10-CM | POA: Diagnosis present

## 2023-07-26 DIAGNOSIS — J9811 Atelectasis: Secondary | ICD-10-CM | POA: Diagnosis not present

## 2023-07-26 DIAGNOSIS — F17210 Nicotine dependence, cigarettes, uncomplicated: Secondary | ICD-10-CM | POA: Diagnosis present

## 2023-07-26 DIAGNOSIS — E8809 Other disorders of plasma-protein metabolism, not elsewhere classified: Secondary | ICD-10-CM | POA: Diagnosis present

## 2023-07-26 DIAGNOSIS — Z90710 Acquired absence of both cervix and uterus: Secondary | ICD-10-CM

## 2023-07-26 DIAGNOSIS — K750 Abscess of liver: Secondary | ICD-10-CM | POA: Diagnosis present

## 2023-07-26 DIAGNOSIS — Z6830 Body mass index (BMI) 30.0-30.9, adult: Secondary | ICD-10-CM

## 2023-07-26 DIAGNOSIS — K9189 Other postprocedural complications and disorders of digestive system: Secondary | ICD-10-CM | POA: Diagnosis not present

## 2023-07-26 DIAGNOSIS — E8770 Fluid overload, unspecified: Secondary | ICD-10-CM | POA: Diagnosis not present

## 2023-07-26 DIAGNOSIS — K631 Perforation of intestine (nontraumatic): Secondary | ICD-10-CM | POA: Diagnosis present

## 2023-07-26 DIAGNOSIS — D649 Anemia, unspecified: Secondary | ICD-10-CM | POA: Diagnosis present

## 2023-07-26 DIAGNOSIS — J449 Chronic obstructive pulmonary disease, unspecified: Secondary | ICD-10-CM | POA: Diagnosis present

## 2023-07-26 DIAGNOSIS — I10 Essential (primary) hypertension: Secondary | ICD-10-CM | POA: Diagnosis present

## 2023-07-26 DIAGNOSIS — E876 Hypokalemia: Secondary | ICD-10-CM | POA: Diagnosis not present

## 2023-07-26 DIAGNOSIS — R188 Other ascites: Secondary | ICD-10-CM | POA: Diagnosis present

## 2023-07-26 DIAGNOSIS — K559 Vascular disorder of intestine, unspecified: Secondary | ICD-10-CM | POA: Diagnosis present

## 2023-07-26 DIAGNOSIS — Y838 Other surgical procedures as the cause of abnormal reaction of the patient, or of later complication, without mention of misadventure at the time of the procedure: Secondary | ICD-10-CM | POA: Diagnosis not present

## 2023-07-26 DIAGNOSIS — F03A4 Unspecified dementia, mild, with anxiety: Secondary | ICD-10-CM | POA: Diagnosis present

## 2023-07-26 DIAGNOSIS — K567 Ileus, unspecified: Secondary | ICD-10-CM | POA: Diagnosis not present

## 2023-07-26 DIAGNOSIS — E663 Overweight: Secondary | ICD-10-CM | POA: Diagnosis present

## 2023-07-26 DIAGNOSIS — I700 Atherosclerosis of aorta: Secondary | ICD-10-CM | POA: Diagnosis present

## 2023-07-26 DIAGNOSIS — F03A3 Unspecified dementia, mild, with mood disturbance: Secondary | ICD-10-CM | POA: Diagnosis present

## 2023-07-26 DIAGNOSIS — K651 Peritoneal abscess: Secondary | ICD-10-CM | POA: Diagnosis not present

## 2023-07-26 DIAGNOSIS — Z7982 Long term (current) use of aspirin: Secondary | ICD-10-CM

## 2023-07-26 DIAGNOSIS — E871 Hypo-osmolality and hyponatremia: Secondary | ICD-10-CM | POA: Diagnosis present

## 2023-07-26 DIAGNOSIS — Z79899 Other long term (current) drug therapy: Secondary | ICD-10-CM

## 2023-07-26 DIAGNOSIS — D72829 Elevated white blood cell count, unspecified: Secondary | ICD-10-CM | POA: Diagnosis not present

## 2023-07-26 DIAGNOSIS — D75839 Thrombocytosis, unspecified: Secondary | ICD-10-CM | POA: Diagnosis not present

## 2023-07-26 DIAGNOSIS — E860 Dehydration: Secondary | ICD-10-CM | POA: Diagnosis present

## 2023-07-26 DIAGNOSIS — R1084 Generalized abdominal pain: Principal | ICD-10-CM

## 2023-07-26 DIAGNOSIS — J90 Pleural effusion, not elsewhere classified: Secondary | ICD-10-CM | POA: Diagnosis present

## 2023-07-26 DIAGNOSIS — T8143XA Infection following a procedure, organ and space surgical site, initial encounter: Secondary | ICD-10-CM | POA: Diagnosis not present

## 2023-07-26 DIAGNOSIS — K565 Intestinal adhesions [bands], unspecified as to partial versus complete obstruction: Secondary | ICD-10-CM | POA: Diagnosis present

## 2023-07-26 DIAGNOSIS — F03A2 Unspecified dementia, mild, with psychotic disturbance: Secondary | ICD-10-CM | POA: Diagnosis present

## 2023-07-26 DIAGNOSIS — R591 Generalized enlarged lymph nodes: Secondary | ICD-10-CM | POA: Diagnosis not present

## 2023-07-26 DIAGNOSIS — R7303 Prediabetes: Secondary | ICD-10-CM | POA: Diagnosis present

## 2023-07-26 DIAGNOSIS — E7800 Pure hypercholesterolemia, unspecified: Secondary | ICD-10-CM | POA: Diagnosis present

## 2023-07-26 DIAGNOSIS — R739 Hyperglycemia, unspecified: Secondary | ICD-10-CM | POA: Diagnosis present

## 2023-07-26 DIAGNOSIS — Z7951 Long term (current) use of inhaled steroids: Secondary | ICD-10-CM

## 2023-07-26 DIAGNOSIS — H9190 Unspecified hearing loss, unspecified ear: Secondary | ICD-10-CM | POA: Diagnosis present

## 2023-07-26 DIAGNOSIS — Z881 Allergy status to other antibiotic agents status: Secondary | ICD-10-CM

## 2023-07-26 HISTORY — PX: LAPAROSCOPY: SHX197

## 2023-07-26 LAB — CBC WITH DIFFERENTIAL/PLATELET
Abs Immature Granulocytes: 0.07 10*3/uL (ref 0.00–0.07)
Basophils Absolute: 0 10*3/uL (ref 0.0–0.1)
Basophils Relative: 0 %
Eosinophils Absolute: 0 10*3/uL (ref 0.0–0.5)
Eosinophils Relative: 0 %
HCT: 46.9 % — ABNORMAL HIGH (ref 36.0–46.0)
Hemoglobin: 15.4 g/dL — ABNORMAL HIGH (ref 12.0–15.0)
Immature Granulocytes: 0 %
Lymphocytes Relative: 9 %
Lymphs Abs: 1.5 10*3/uL (ref 0.7–4.0)
MCH: 31.6 pg (ref 26.0–34.0)
MCHC: 32.8 g/dL (ref 30.0–36.0)
MCV: 96.3 fL (ref 80.0–100.0)
Monocytes Absolute: 0.8 10*3/uL (ref 0.1–1.0)
Monocytes Relative: 5 %
Neutro Abs: 13.7 10*3/uL — ABNORMAL HIGH (ref 1.7–7.7)
Neutrophils Relative %: 86 %
Platelets: 412 10*3/uL — ABNORMAL HIGH (ref 150–400)
RBC: 4.87 MIL/uL (ref 3.87–5.11)
RDW: 13.5 % (ref 11.5–15.5)
WBC: 16.1 10*3/uL — ABNORMAL HIGH (ref 4.0–10.5)
nRBC: 0 % (ref 0.0–0.2)

## 2023-07-26 LAB — I-STAT CHEM 8, ED
BUN: 17 mg/dL (ref 8–23)
Calcium, Ion: 1.25 mmol/L (ref 1.15–1.40)
Chloride: 99 mmol/L (ref 98–111)
Creatinine, Ser: 0.6 mg/dL (ref 0.44–1.00)
Glucose, Bld: 170 mg/dL — ABNORMAL HIGH (ref 70–99)
HCT: 49 % — ABNORMAL HIGH (ref 36.0–46.0)
Hemoglobin: 16.7 g/dL — ABNORMAL HIGH (ref 12.0–15.0)
Potassium: 3.9 mmol/L (ref 3.5–5.1)
Sodium: 137 mmol/L (ref 135–145)
TCO2: 27 mmol/L (ref 22–32)

## 2023-07-26 LAB — HEMOGLOBIN A1C
Hgb A1c MFr Bld: 6.1 % — ABNORMAL HIGH (ref 4.8–5.6)
Mean Plasma Glucose: 128.37 mg/dL

## 2023-07-26 LAB — COMPREHENSIVE METABOLIC PANEL
ALT: 41 U/L (ref 0–44)
AST: 31 U/L (ref 15–41)
Albumin: 4.5 g/dL (ref 3.5–5.0)
Alkaline Phosphatase: 70 U/L (ref 38–126)
Anion gap: 13 (ref 5–15)
BUN: 16 mg/dL (ref 8–23)
CO2: 26 mmol/L (ref 22–32)
Calcium: 10.5 mg/dL — ABNORMAL HIGH (ref 8.9–10.3)
Chloride: 98 mmol/L (ref 98–111)
Creatinine, Ser: 0.67 mg/dL (ref 0.44–1.00)
GFR, Estimated: >60 mL/min (ref >60)
Glucose, Bld: 166 mg/dL — ABNORMAL HIGH (ref 70–99)
Potassium: 3.8 mmol/L (ref 3.5–5.1)
Sodium: 137 mmol/L (ref 135–145)
Total Bilirubin: 0.7 mg/dL (ref <1.2)
Total Protein: 7.6 g/dL (ref 6.5–8.1)

## 2023-07-26 LAB — LIPASE, BLOOD: Lipase: 25 U/L (ref 11–51)

## 2023-07-26 LAB — LACTIC ACID, PLASMA: Lactic Acid, Venous: 3.7 mmol/L (ref 0.5–1.9)

## 2023-07-26 LAB — GLUCOSE, CAPILLARY
Glucose-Capillary: 191 mg/dL — ABNORMAL HIGH (ref 70–99)
Glucose-Capillary: 156 mg/dL — ABNORMAL HIGH (ref 70–99)

## 2023-07-26 SURGERY — LAPAROSCOPY, DIAGNOSTIC
Anesthesia: General

## 2023-07-26 MED ORDER — HYDROMORPHONE HCL 1 MG/ML IJ SOLN
0.5000 mg | INTRAMUSCULAR | Status: DC | PRN
  Administered 2023-07-27 – 2023-07-28 (×2): 1 mg via INTRAVENOUS
  Filled 2023-07-26 (×3): qty 1

## 2023-07-26 MED ORDER — PIPERACILLIN-TAZOBACTAM 3.375 G IVPB
3.3750 g | Freq: Once | INTRAVENOUS | Status: AC
  Administered 2023-07-26: 3.375 g via INTRAVENOUS
  Filled 2023-07-26: qty 50

## 2023-07-26 MED ORDER — DROPERIDOL 2.5 MG/ML IJ SOLN: 0.6250 mg | Freq: Once | INTRAMUSCULAR | Status: DC | PRN

## 2023-07-26 MED ORDER — PHENYLEPHRINE 80 MCG/ML (10ML) SYRINGE FOR IV PUSH (FOR BLOOD PRESSURE SUPPORT)
PREFILLED_SYRINGE | INTRAVENOUS | Status: AC
  Filled 2023-07-26: qty 10

## 2023-07-26 MED ORDER — ALBUMIN HUMAN 5 % IV SOLN
INTRAVENOUS | Status: AC
  Filled 2023-07-26: qty 250

## 2023-07-26 MED ORDER — MEPERIDINE HCL 50 MG/ML IJ SOLN: 6.2500 mg | INTRAMUSCULAR | Status: DC | PRN

## 2023-07-26 MED ORDER — ONDANSETRON HCL 4 MG/2ML IJ SOLN
4.0000 mg | Freq: Four times a day (QID) | INTRAMUSCULAR | Status: DC | PRN
  Filled 2023-07-26 (×2): qty 2

## 2023-07-26 MED ORDER — ENOXAPARIN SODIUM 30 MG/0.3ML IJ SOSY
30.0000 mg | PREFILLED_SYRINGE | INTRAMUSCULAR | Status: DC
  Administered 2023-07-27 – 2023-08-12 (×16): 30 mg via SUBCUTANEOUS
  Filled 2023-07-26 (×17): qty 0.3

## 2023-07-26 MED ORDER — ONDANSETRON HCL 4 MG/2ML IJ SOLN
4.0000 mg | Freq: Once | INTRAMUSCULAR | Status: AC
  Administered 2023-07-26: 4 mg via INTRAVENOUS
  Filled 2023-07-26: qty 2

## 2023-07-26 MED ORDER — SODIUM CHLORIDE 0.9 % IV BOLUS
1000.0000 mL | Freq: Once | INTRAVENOUS | Status: AC
  Administered 2023-07-26: 1000 mL via INTRAVENOUS

## 2023-07-26 MED ORDER — LIDOCAINE HCL (PF) 2 % IJ SOLN
INTRAMUSCULAR | Status: AC
  Filled 2023-07-26: qty 5

## 2023-07-26 MED ORDER — MORPHINE SULFATE (PF) 4 MG/ML IV SOLN
4.0000 mg | Freq: Once | INTRAVENOUS | Status: AC
  Administered 2023-07-26: 4 mg via INTRAVENOUS
  Filled 2023-07-26: qty 1

## 2023-07-26 MED ORDER — LACTATED RINGERS IV SOLN: INTRAVENOUS | Status: DC | PRN

## 2023-07-26 MED ORDER — SIMETHICONE 80 MG PO CHEW: 40.0000 mg | CHEWABLE_TABLET | Freq: Four times a day (QID) | ORAL | Status: DC | PRN

## 2023-07-26 MED ORDER — PROPOFOL 10 MG/ML IV BOLUS
INTRAVENOUS | Status: AC
Start: 2023-07-26 — End: ?
  Filled 2023-07-26: qty 20

## 2023-07-26 MED ORDER — SUGAMMADEX SODIUM 200 MG/2ML IV SOLN
INTRAVENOUS | Status: DC | PRN
  Administered 2023-07-26: 200 mg via INTRAVENOUS

## 2023-07-26 MED ORDER — ACETAMINOPHEN 160 MG/5ML PO SOLN: 325.0000 mg | Freq: Once | ORAL | Status: DC | PRN

## 2023-07-26 MED ORDER — ALBUMIN HUMAN 5 % IV SOLN: INTRAVENOUS | Status: DC | PRN

## 2023-07-26 MED ORDER — ACETAMINOPHEN 10 MG/ML IV SOLN: 1000.0000 mg | Freq: Once | INTRAVENOUS | Status: DC | PRN

## 2023-07-26 MED ORDER — LIDOCAINE HCL (CARDIAC) PF 100 MG/5ML IV SOSY
PREFILLED_SYRINGE | INTRAVENOUS | Status: DC | PRN
  Administered 2023-07-26: 40 mg via INTRAVENOUS

## 2023-07-26 MED ORDER — IOHEXOL 300 MG/ML  SOLN
100.0000 mL | Freq: Once | INTRAMUSCULAR | Status: AC | PRN
Start: 2023-07-26 — End: 2023-07-26
  Administered 2023-07-26: 100 mL via INTRAVENOUS

## 2023-07-26 MED ORDER — ROCURONIUM BROMIDE 100 MG/10ML IV SOLN
INTRAVENOUS | Status: DC | PRN
  Administered 2023-07-26: 30 mg via INTRAVENOUS

## 2023-07-26 MED ORDER — HYDROMORPHONE HCL 1 MG/ML IJ SOLN
0.2500 mg | INTRAMUSCULAR | Status: DC | PRN
Start: 2023-07-26 — End: 2023-07-26
  Administered 2023-07-26: 0.5 mg via INTRAVENOUS

## 2023-07-26 MED ORDER — SUCCINYLCHOLINE CHLORIDE 200 MG/10ML IV SOSY
PREFILLED_SYRINGE | INTRAVENOUS | Status: DC | PRN
Start: 2023-07-26 — End: 2023-07-26
  Administered 2023-07-26: 120 mg via INTRAVENOUS

## 2023-07-26 MED ORDER — EPHEDRINE 5 MG/ML INJ
INTRAVENOUS | Status: AC
  Filled 2023-07-26: qty 5

## 2023-07-26 MED ORDER — ONDANSETRON 4 MG PO TBDP
4.0000 mg | ORAL_TABLET | Freq: Four times a day (QID) | ORAL | Status: DC | PRN
  Administered 2023-07-30 (×2): 4 mg via ORAL
  Filled 2023-07-26: qty 1

## 2023-07-26 MED ORDER — ONDANSETRON HCL 4 MG/2ML IJ SOLN
INTRAMUSCULAR | Status: DC | PRN
  Administered 2023-07-26: 4 mg via INTRAVENOUS

## 2023-07-26 MED ORDER — ONDANSETRON HCL 4 MG/2ML IJ SOLN
INTRAMUSCULAR | Status: AC
  Filled 2023-07-26: qty 2

## 2023-07-26 MED ORDER — BUPIVACAINE-EPINEPHRINE 0.25% -1:200000 IJ SOLN
INTRAMUSCULAR | Status: AC
  Filled 2023-07-26: qty 1

## 2023-07-26 MED ORDER — PHENYLEPHRINE HCL-NACL 20-0.9 MG/250ML-% IV SOLN
INTRAVENOUS | Status: DC | PRN
  Administered 2023-07-26: 100 ug via INTRAVENOUS

## 2023-07-26 MED ORDER — DEXAMETHASONE SODIUM PHOSPHATE 10 MG/ML IJ SOLN
INTRAMUSCULAR | Status: AC
  Filled 2023-07-26: qty 1

## 2023-07-26 MED ORDER — ALBUTEROL SULFATE HFA 108 (90 BASE) MCG/ACT IN AERS
INHALATION_SPRAY | RESPIRATORY_TRACT | Status: AC
  Filled 2023-07-26: qty 6.7

## 2023-07-26 MED ORDER — FENTANYL CITRATE (PF) 100 MCG/2ML IJ SOLN
INTRAMUSCULAR | Status: DC | PRN
  Administered 2023-07-26 (×2): 50 ug via INTRAVENOUS
  Administered 2023-07-26: 25 ug via INTRAVENOUS

## 2023-07-26 MED ORDER — HYDROMORPHONE HCL 1 MG/ML IJ SOLN
INTRAMUSCULAR | Status: AC
  Administered 2023-07-27: 1 mg via INTRAVENOUS
  Filled 2023-07-26: qty 2

## 2023-07-26 MED ORDER — ROCURONIUM BROMIDE 10 MG/ML (PF) SYRINGE
PREFILLED_SYRINGE | INTRAVENOUS | Status: AC
  Filled 2023-07-26: qty 10

## 2023-07-26 MED ORDER — FENTANYL CITRATE (PF) 100 MCG/2ML IJ SOLN
INTRAMUSCULAR | Status: AC
  Filled 2023-07-26: qty 2

## 2023-07-26 MED ORDER — ALBUTEROL SULFATE HFA 108 (90 BASE) MCG/ACT IN AERS
INHALATION_SPRAY | RESPIRATORY_TRACT | Status: DC | PRN
  Administered 2023-07-26: 5 via RESPIRATORY_TRACT

## 2023-07-26 MED ORDER — ACETAMINOPHEN 10 MG/ML IV SOLN
INTRAVENOUS | Status: AC
  Administered 2023-07-26: 1000 mg via INTRAVENOUS
  Filled 2023-07-26: qty 100

## 2023-07-26 MED ORDER — LACTATED RINGERS IV SOLN
INTRAVENOUS | Status: DC
Start: 2023-07-26 — End: 2023-07-26

## 2023-07-26 MED ORDER — ACETAMINOPHEN 325 MG PO TABS: 325.0000 mg | ORAL_TABLET | Freq: Once | ORAL | Status: DC | PRN

## 2023-07-26 MED ORDER — PROPOFOL 10 MG/ML IV BOLUS
INTRAVENOUS | Status: DC | PRN
  Administered 2023-07-26: 140 mg via INTRAVENOUS

## 2023-07-26 SURGICAL SUPPLY — 52 items
APPLIER CLIP 5 13 M/L LIGAMAX5 (MISCELLANEOUS)
APPLIER CLIP ROT 10 11.4 M/L (STAPLE)
BAG COUNTER SPONGE SURGICOUNT (BAG) IMPLANT
BLADE EXTENDED COATED 6.5IN (ELECTRODE) IMPLANT
CELLS DAT CNTRL 66122 CELL SVR (MISCELLANEOUS) ×1 IMPLANT
CLIP APPLIE 5 13 M/L LIGAMAX5 (MISCELLANEOUS) IMPLANT
CLIP APPLIE ROT 10 11.4 M/L (STAPLE) IMPLANT
COVER MAYO STAND STRL (DRAPES) ×2 IMPLANT
DERMABOND ADVANCED .7 DNX12 (GAUZE/BANDAGES/DRESSINGS) IMPLANT
DRAPE WARM FLUID 44X44 (DRAPES) IMPLANT
ELECT REM PT RETURN 15FT ADLT (MISCELLANEOUS) ×2 IMPLANT
GAUZE PAD ABD 8X10 STRL (GAUZE/BANDAGES/DRESSINGS) IMPLANT
GAUZE SPONGE 4X4 12PLY STRL (GAUZE/BANDAGES/DRESSINGS) ×2 IMPLANT
GLOVE BIO SURGEON STRL SZ 6.5 (GLOVE) ×4 IMPLANT
GLOVE INDICATOR 6.5 STRL GRN (GLOVE) ×4 IMPLANT
GOWN STRL REUS W/ TWL XL LVL3 (GOWN DISPOSABLE) ×6 IMPLANT
HANDLE SUCTION POOLE (INSTRUMENTS) IMPLANT
IRRIG SUCT STRYKERFLOW 2 WTIP (MISCELLANEOUS) ×1
IRRIGATION SUCT STRKRFLW 2 WTP (MISCELLANEOUS) ×2 IMPLANT
KIT TURNOVER KIT A (KITS) IMPLANT
LIGASURE IMPACT 36 18CM CVD LR (INSTRUMENTS) IMPLANT
PACK COLON (CUSTOM PROCEDURE TRAY) ×2 IMPLANT
RELOAD PROXIMATE 75MM BLUE (ENDOMECHANICALS) ×2 IMPLANT
RELOAD STAPLE 75 3.8 BLU REG (ENDOMECHANICALS) IMPLANT
RETRACTOR WND ALEXIS 18 MED (MISCELLANEOUS) IMPLANT
RTRCTR WOUND ALEXIS 18CM MED (MISCELLANEOUS) ×1
SEALER TISSUE G2 STRG ARTC 35C (ENDOMECHANICALS) IMPLANT
SET TUBE SMOKE EVAC HIGH FLOW (TUBING) ×2 IMPLANT
SLEEVE ADV FIXATION 5X100MM (TROCAR) ×6 IMPLANT
SPIKE FLUID TRANSFER (MISCELLANEOUS) ×2 IMPLANT
STAPLER GUN LINEAR PROX 60 (STAPLE) IMPLANT
STAPLER PROXIMATE 75MM BLUE (STAPLE) IMPLANT
STAPLER SKIN PROX WIDE 3.9 (STAPLE) IMPLANT
SUCTION POOLE HANDLE (INSTRUMENTS) ×1
SUT MNCRL AB 4-0 PS2 18 (SUTURE) IMPLANT
SUT NOVA 1 T20/GS 25DT (SUTURE) IMPLANT
SUT PDS AB 1 CT1 27 (SUTURE) ×4 IMPLANT
SUT PDS AB 1 TP1 96 (SUTURE) IMPLANT
SUT PROLENE 2 0 KS (SUTURE) IMPLANT
SUT PROLENE 2 0 SH DA (SUTURE) IMPLANT
SUT SILK 2 0 SH CR/8 (SUTURE) ×2 IMPLANT
SUT SILK 2-0 18XBRD TIE 12 (SUTURE) ×2 IMPLANT
SUT SILK 3 0 SH CR/8 (SUTURE) ×2 IMPLANT
SUT SILK 3-0 18XBRD TIE 12 (SUTURE) ×2 IMPLANT
SUT VICRYL 0 UR6 27IN ABS (SUTURE) IMPLANT
TOWEL OR 17X26 10 PK STRL BLUE (TOWEL DISPOSABLE) IMPLANT
TOWEL OR NON WOVEN STRL DISP B (DISPOSABLE) ×2 IMPLANT
TRAY FOLEY MTR SLVR 16FR STAT (SET/KITS/TRAYS/PACK) ×2 IMPLANT
TROCAR 11X100 Z THREAD (TROCAR) IMPLANT
TROCAR ADV FIXATION 5X100MM (TROCAR) ×2 IMPLANT
TROCAR BALLN 12MMX100 BLUNT (TROCAR) IMPLANT
TUBING CONNECTING 10 (TUBING) ×4 IMPLANT

## 2023-07-26 NOTE — H&P (Signed)
History and Physical    Patient: Laura Bradshaw ZOX:[id number] DOB: Apr 23, 1951 DOA: 07/26/2023 DOS: the patient was seen and examined on 07/26/2023 PCP: Alberteen Sam, FNP  Patient coming from: Home.  Lives alone.  Independently ambulates at baseline.  Chief Complaint:  Chief Complaint  Patient presents with   Abdominal Pain        HPI: KASHTYN TRILLO is a 72 y.o. female with PMH of HTN, anxiety, depression, COPD, HSV, low back pain and mild dementia presenting with acute abdominal pain and vomiting.  He is provided by patient and patient's daughter and son-in-law at the bedside.   Patient reports chronic LLQ pain but developed significant RLQ pain since 3 AM this morning.  She was unable to describe her pain.  She rates her pain 7/10.  She has progressive abdominal pain with emesis throughout the day.  She felt like her abdomen was very distended after emesis.  Denies prior history of small bowel obstruction.  She has history of hysterectomy and appendectomy.  She had about 4 emesis throughout the day today.  Emesis was nonbloody and nonbilious.  Denies fever, chills, URI symptoms, chest pain, shortness of breath or UTI symptoms.   Patient lives alone.  Smokes about a pack a day.  Denies alcohol or recreational drug use.  Likes to have cardiopulmonary cessation in event of sudden cardiopulmonary arrest.  In ED, slightly hypertensive.  Glucose 166.  Calcium 10.5.  WBC 16.1.  CT abdomen and pelvis concerning for closed-loop bowel obstruction and ischemia.  General surgery consulted and planning to take patient to the OR emergently.  Patient received morphine, Zofran and Zosyn in ED.   Review of Systems: As mentioned in the history of present illness. All other systems reviewed and are negative. Past Medical History:  Diagnosis Date   Anxiousness    Back pain    Diverticulitis    Environmental allergies    HOH (hard of hearing)    Hypercholesteremia    Hypertension    Restless  leg    Past Surgical History:  Procedure Laterality Date   ABDOMINAL HYSTERECTOMY     APPENDECTOMY     SHOULDER SURGERY     TONSILLECTOMY     Social History:  reports that she has been smoking cigarettes. She has never used smokeless tobacco. She reports that she does not drink alcohol and does not use drugs.  Allergies  Allergen Reactions   Ciprofloxacin Hives   Codeine    Sulfonamide Derivatives     History reviewed. No pertinent family history.  Prior to Admission medications   Medication Sig Start Date End Date Taking? Authorizing Provider  ALPRAZolam Prudy Feeler) 0.5 MG tablet Take 0.5 mg by mouth at bedtime as needed for anxiety.    [provider]  aspirin 325 MG tablet Take 325 mg by mouth daily.    [provider]  benzonatate (TESSALON) 100 MG capsule Take 1 capsule (100 mg total) by mouth every 8 (eight) hours. 01/06/18   Linwood Dibbles, MD  doxycycline (VIBRAMYCIN) 100 MG capsule Take 1 capsule (100 mg total) by mouth 2 (two) times daily. 01/06/18   Linwood Dibbles, MD  escitalopram (LEXAPRO) 20 MG tablet Take 20 mg by mouth daily.    [provider]  fexofenadine (ALLEGRA) 180 MG tablet Take 180 mg by mouth daily.    [provider]  gabapentin (NEURONTIN) 300 MG capsule Take 300 mg by mouth 3 (three) times daily.    [provider]  hydrochlorothiazide (HYDRODIURIL) 25 MG tablet Take 25 mg by mouth daily.    [provider]  lisinopril (PRINIVIL,ZESTRIL) 20 MG tablet Take 20 mg by mouth daily.    [provider]  montelukast (SINGULAIR) 10 MG tablet Take 10 mg by mouth at bedtime.    [provider]  naproxen (NAPROSYN) 500 MG tablet Take 500 mg by mouth 2 (two) times daily with a meal.    [provider]  simvastatin (ZOCOR) 40 MG tablet Take 40 mg by mouth daily.    [provider]  traMADol (ULTRAM) 50 MG tablet Take 1 tablet (50 mg total) by mouth every 6 (six) hours as needed. 01/06/18    Linwood Dibbles, MD    Physical Exam: Vitals:   07/26/23 1337 07/26/23 1340 07/26/23 1342  BP:  (!) 164/87   Pulse:  79   Resp:  17   Temp:  97.7 F (36.5 C)   TempSrc:  Oral   SpO2:  95%   Weight:   67.6 kg  Height: 5\' 1"  (1.549 m)  5\' 1"  (1.549 m)   GENERAL: No apparent distress.  Nontoxic. HEENT: MMM.  Vision and hearing grossly intact.  NECK: Supple.  No apparent JVD.  RESP:  No IWOB.  Fair aeration bilaterally. CVS:  RRR. Heart sounds normal.  ABD/GI/GU: BS+. Abd soft.  Tenderness over RLQ and LLQ.  No rebound or guarding. MSK/EXT:   No apparent deformity. Moves extremities. No edema.  SKIN: no apparent skin lesion or wound NEURO: Wake but not quite alert.  Oriented appropriately.  No apparent focal neuro deficit. PSYCH: Calm. Normal affect.  Data Reviewed: See HPI  Assessment and Plan: Small bowel obstruction with ischemia -General Surgery to take patient to the OR emergently. -IV fluid, analgesics and antiemetics  Essential hypertension: BP slightly elevated. -Consider as needed antihypertensive meds after surgery  Anxiety and depression: Stable -Resume home meds once able to take p.o. orally  Mild hypercalcemia: Likely due to dehydration. -IV fluid  Leukocytosis: Likely demargination from #1.  Low suspicion for infection.  Hyperglycemia: Mild.  No formal diagnosis of diabetes. -Check hemoglobin A1c   Advance Care Planning:   Code Status: Full Code discussed with patient, daughter and son-in-law at bedside  Consults: General Surgery  Family Communication: Updated patient's daughter and son-in-law at bedside  Severity of Illness: The appropriate patient status for this patient is INPATIENT. Inpatient status is judged to be reasonable and necessary in order to provide the required intensity of service to ensure the patient's safety. The patient's presenting symptoms, physical exam findings, and initial radiographic and laboratory data in the context of their  chronic comorbidities is felt to place them at high risk for further clinical deterioration. Furthermore, it is not anticipated that the patient will be medically stable for discharge from the hospital within 2 midnights of admission.   * I certify that at the point of admission it is my clinical judgment that the patient will require inpatient hospital care spanning beyond 2 midnights from the point of admission due to high intensity of service, high risk for further deterioration and high frequency of surveillance required.*  Author: Almon Hercules, MD 07/26/2023 7:30 PM  For on call review [email or web address].

## 2023-07-26 NOTE — ED Provider Notes (Signed)
[place] EMERGENCY DEPARTMENT AT [facility] Provider Note   CSN: [id number] Arrival date & time: 07/26/23  1310     History  Chief Complaint  Patient presents with   Abdominal Pain         Laura Bradshaw is a 72 y.o. female.  72 yo F with a chief complaints of diffuse abdominal pain nausea vomiting.  This started this morning about 3.  Has gotten progressively worse.  Feels like before though she had been moving her bowels okay urinating without issue.  Pain is gotten persistently worse and she feels like she has got more distended over the day and so EMS was called.   Abdominal Pain      Home Medications Prior to Admission medications   Medication Sig Start Date End Date Taking? Authorizing Provider  ALPRAZolam Prudy Feeler) 0.5 MG tablet Take 0.5 mg by mouth at bedtime as needed for anxiety.    [provider]  aspirin 325 MG tablet Take 325 mg by mouth daily.    [provider]  benzonatate (TESSALON) 100 MG capsule Take 1 capsule (100 mg total) by mouth every 8 (eight) hours. 01/06/18   Linwood Dibbles, MD  doxycycline (VIBRAMYCIN) 100 MG capsule Take 1 capsule (100 mg total) by mouth 2 (two) times daily. 01/06/18   Linwood Dibbles, MD  escitalopram (LEXAPRO) 20 MG tablet Take 20 mg by mouth daily.    [provider]  fexofenadine (ALLEGRA) 180 MG tablet Take 180 mg by mouth daily.    [provider]  gabapentin (NEURONTIN) 300 MG capsule Take 300 mg by mouth 3 (three) times daily.    [provider]  hydrochlorothiazide (HYDRODIURIL) 25 MG tablet Take 25 mg by mouth daily.    [provider]  lisinopril (PRINIVIL,ZESTRIL) 20 MG tablet Take 20 mg by mouth daily.    [provider]  montelukast (SINGULAIR) 10 MG tablet Take 10 mg by mouth at bedtime.    [provider]  naproxen (NAPROSYN) 500 MG tablet Take 500 mg by mouth 2 (two) times daily with a meal.    [provider]  simvastatin (ZOCOR)  40 MG tablet Take 40 mg by mouth daily.    [provider]  traMADol (ULTRAM) 50 MG tablet Take 1 tablet (50 mg total) by mouth every 6 (six) hours as needed. 01/06/18   Linwood Dibbles, MD      Allergies    Ciprofloxacin, Codeine, and Sulfonamide derivatives    Review of Systems   Review of Systems  Gastrointestinal:  Positive for abdominal pain.    Physical Exam Updated Vital Signs BP (!) 164/87 (BP Location: Left Arm)   Pulse 79   Temp 97.7 F (36.5 C) (Oral)   Resp 17   Ht 5\' 1"  (1.549 m)   Wt 67.6 kg   SpO2 95%   BMI 28.15 kg/m  Physical Exam Vitals and nursing note reviewed.  Constitutional:      General: She is not in acute distress.    Appearance: She is well-developed. She is not diaphoretic.  HENT:     Head: Normocephalic and atraumatic.  Eyes:     Pupils: Pupils are equal, round, and reactive to light.  Cardiovascular:     Rate and Rhythm: Normal rate and regular rhythm.     Heart sounds: No murmur heard.    No friction rub. No gallop.  Pulmonary:     Effort: Pulmonary effort is normal.  Breath sounds: No wheezing or rales.  Abdominal:     General: There is no distension.     Palpations: Abdomen is soft.     Tenderness: There is no abdominal tenderness.     Comments: Mild diffuse abdominal discomfort without focality.  Musculoskeletal:        General: No tenderness.     Cervical back: Normal range of motion and neck supple.  Skin:    General: Skin is warm and dry.  Neurological:     Mental Status: She is alert and oriented to person, place, and time.  Psychiatric:        Behavior: Behavior normal.     ED Results / Procedures / Treatments   Labs (all labs ordered are listed, but only abnormal results are displayed) Labs Reviewed  CBC WITH DIFFERENTIAL/PLATELET - Abnormal; Notable for the following components:      Result Value   WBC 16.1 (*)    Hemoglobin 15.4 (*)    HCT 46.9 (*)    Platelets 412 (*)    Neutro Abs 13.7 (*)    All  other components within normal limits  COMPREHENSIVE METABOLIC PANEL - Abnormal; Notable for the following components:   Glucose, Bld 166 (*)    Calcium 10.5 (*)    All other components within normal limits  I-STAT CHEM 8, ED - Abnormal; Notable for the following components:   Glucose, Bld 170 (*)    Hemoglobin 16.7 (*)    HCT 49.0 (*)    All other components within normal limits  LIPASE, BLOOD  URINALYSIS, ROUTINE W REFLEX MICROSCOPIC    EKG None  Radiology No results found.  Procedures Procedures    Medications Ordered in ED Medications  sodium chloride 0.9 % bolus 1,000 mL (1,000 mLs Intravenous New Bag/Given 07/26/23 1403)  morphine (PF) 4 MG/ML injection 4 mg (4 mg Intravenous Given 07/26/23 1358)  ondansetron (ZOFRAN) injection 4 mg (4 mg Intravenous Given 07/26/23 1358)  morphine (PF) 4 MG/ML injection 4 mg (4 mg Intravenous Given 07/26/23 1452)  ondansetron (ZOFRAN) injection 4 mg (4 mg Intravenous Given 07/26/23 1452)    ED Course/ Medical Decision Making/ A&P                                 Medical Decision Making Amount and/or Complexity of Data Reviewed Labs: ordered. Radiology: ordered.  Risk Prescription drug management.   72 yo F with a chief complaints of diffuse abdominal pain and abdominal distention.  Going on since this morning.  Will obtain a laboratory evaluation and CT imaging reassess.  Leukocytosis with neurophilic predominance.   LFTs and lipase are unremarkable.  Renal function at baseline.  Awaiting CT.  Signed out to Dr. Rhae Hammock, please see their note for further details of care in the ED.   The patients results and plan were reviewed and discussed.   Any x-rays performed were independently reviewed by myself.   Differential diagnosis were considered with the presenting HPI.  Medications  sodium chloride 0.9 % bolus 1,000 mL (1,000 mLs Intravenous New Bag/Given 07/26/23 1403)  morphine (PF) 4 MG/ML injection 4 mg (4 mg Intravenous Given  07/26/23 1358)  ondansetron (ZOFRAN) injection 4 mg (4 mg Intravenous Given 07/26/23 1358)  morphine (PF) 4 MG/ML injection 4 mg (4 mg Intravenous Given 07/26/23 1452)  ondansetron (ZOFRAN) injection 4 mg (4 mg Intravenous Given 07/26/23 1452)    Vitals:  07/26/23 1337 07/26/23 1340 07/26/23 1342  BP:  (!) 164/87   Pulse:  79   Resp:  17   Temp:  97.7 F (36.5 C)   TempSrc:  Oral   SpO2:  95%   Weight:   67.6 kg  Height: 5\' 1"  (1.549 m)  5\' 1"  (1.549 m)    Final diagnoses:  Generalized abdominal pain    Admission/ observation were discussed with the admitting physician, patient and/or family and they are comfortable with the plan.           Final Clinical Impression(s) / ED Diagnoses Final diagnoses:  Generalized abdominal pain    Rx / DC Orders ED Discharge Orders     None         Melene Plan, DO 07/26/23 1516

## 2023-07-26 NOTE — Anesthesia Preprocedure Evaluation (Addendum)
Anesthesia Evaluation  Patient identified by MRN, date of birth, ID band Patient awake    Reviewed: Allergy & Precautions, NPO status , Patient's Chart, lab work & pertinent test results  Airway Mallampati: I  TM Distance: >3 FB Neck ROM: Full    Dental  (+) Upper Dentures, Dental Advisory Given   Pulmonary Current Smoker   breath sounds clear to auscultation       Cardiovascular hypertension, Pt. on medications  Rhythm:Regular Rate:Normal     Neuro/Psych  PSYCHIATRIC DISORDERS Anxiety Depression    negative neurological ROS     GI/Hepatic negative GI ROS, Neg liver ROS,,,  Endo/Other  negative endocrine ROS    Renal/GU negative Renal ROS     Musculoskeletal  (+) Arthritis ,    Abdominal   Peds  Hematology negative hematology ROS (+)   Anesthesia Other Findings   Reproductive/Obstetrics                             Anesthesia Physical Anesthesia Plan  ASA: 2 and emergent  Anesthesia Plan: General   Post-op Pain Management: Tylenol PO (pre-op)* and Toradol IV (intra-op)*   Induction: Intravenous, Rapid sequence and Cricoid pressure planned  PONV Risk Score and Plan: 3 and Ondansetron, Dexamethasone and Midazolam  Airway Management Planned: Oral ETT  Additional Equipment: None  Intra-op Plan:   Post-operative Plan: Extubation in OR  Informed Consent: I have reviewed the patients History and Physical, chart, labs and discussed the procedure including the risks, benefits and alternatives for the proposed anesthesia with the patient or authorized representative who has indicated his/her understanding and acceptance.     Dental advisory given  Plan Discussed with: CRNA  Anesthesia Plan Comments:        Anesthesia Quick Evaluation

## 2023-07-26 NOTE — Transfer of Care (Signed)
Immediate Anesthesia Transfer of Care Note  Patient: Laura Bradshaw  Procedure(s) Performed: LAPAROSCOPY DIAGNOSTIC CONVERTED TO OPEN BOWEL RESECTION  Patient Location: PACU  Anesthesia Type:General  Level of Consciousness: awake, alert , and confused  Airway & Oxygen Therapy: Patient Spontanous Breathing and Patient connected to face mask oxygen  Post-op Assessment: Report given to RN and Post -op Vital signs reviewed and stable  Post vital signs: Reviewed and stable  Last Vitals:  Vitals Value Taken Time  BP 128/88 07/26/23 2022  Temp    Pulse 89 07/26/23 2024  Resp 18 07/26/23 2024  SpO2 100 % 07/26/23 2024  Vitals shown include unfiled device data.  Last Pain:  Vitals:   07/26/23 1506  TempSrc:   PainSc: 6          Complications: No notable events documented.

## 2023-07-26 NOTE — Consult Note (Signed)
CC: abd pain  HPI: Laura Bradshaw is an 72 y.o. female who is here for abdominal pain that began early this morning.  Associated with nausea and vomiting and progressively worsened throughout the day.  CT concerning for closed loop bowel obstruction with ischemia.    Past Medical History:  Diagnosis Date   Anxiousness    Back pain    Diverticulitis    Environmental allergies    HOH (hard of hearing)    Hypercholesteremia    Hypertension    Restless leg     Past Surgical History:  Procedure Laterality Date   ABDOMINAL HYSTERECTOMY     APPENDECTOMY     SHOULDER SURGERY     TONSILLECTOMY      History reviewed. No pertinent family history.  Social:  reports that she has been smoking cigarettes. She has never used smokeless tobacco. She reports that she does not drink alcohol and does not use drugs.  Allergies:  Allergies  Allergen Reactions   Ciprofloxacin Hives   Codeine    Sulfonamide Derivatives     Medications: I have reviewed the patient's current medications.  Results for orders placed or performed during the hospital encounter of 07/26/23 (from the past 48 hour(s))  CBC with Differential     Status: Abnormal   Collection Time: 07/26/23  1:58 PM  Result Value Ref Range   WBC 16.1 (H) 4.0 - 10.5 K/uL   RBC 4.87 3.87 - 5.11 MIL/uL   Hemoglobin 15.4 (H) 12.0 - 15.0 g/dL   HCT 41.3 (H) 24.4 - 01.0 %   MCV 96.3 80.0 - 100.0 fL   MCH 31.6 26.0 - 34.0 pg   MCHC 32.8 30.0 - 36.0 g/dL   RDW 27.2 53.6 - 64.4 %   Platelets 412 (H) 150 - 400 K/uL   nRBC 0.0 0.0 - 0.2 %   Neutrophils Relative % 86 %   Neutro Abs 13.7 (H) 1.7 - 7.7 K/uL   Lymphocytes Relative 9 %   Lymphs Abs 1.5 0.7 - 4.0 K/uL   Monocytes Relative 5 %   Monocytes Absolute 0.8 0.1 - 1.0 K/uL   Eosinophils Relative 0 %   Eosinophils Absolute 0.0 0.0 - 0.5 K/uL   Basophils Relative 0 %   Basophils Absolute 0.0 0.0 - 0.1 K/uL   Immature Granulocytes 0 %   Abs Immature Granulocytes 0.07 0.00 -  0.07 K/uL    Comment: Performed at [facility], [address], [place], [place] [address]  Comprehensive metabolic panel     Status: Abnormal   Collection Time: 07/26/23  1:58 PM  Result Value Ref Range   Sodium 137 135 - 145 mmol/L   Potassium 3.8 3.5 - 5.1 mmol/L    Comment: HEMOLYSIS AT THIS LEVEL MAY AFFECT RESULT   Chloride 98 98 - 111 mmol/L   CO2 26 22 - 32 mmol/L   Glucose, Bld 166 (H) 70 - 99 mg/dL    Comment: Glucose reference range applies only to samples taken after fasting for at least 8 hours.   BUN 16 8 - 23 mg/dL   Creatinine, Ser 2.59 0.44 - 1.00 mg/dL   Calcium 56.3 (H) 8.9 - 10.3 mg/dL   Total Protein 7.6 6.5 - 8.1 g/dL   Albumin 4.5 3.5 - 5.0 g/dL   AST 31 15 - 41 U/L    Comment: HEMOLYSIS AT THIS LEVEL MAY AFFECT RESULT   ALT 41 0 - 44 U/L    Comment:  HEMOLYSIS AT THIS LEVEL MAY AFFECT RESULT   Alkaline Phosphatase 70 38 - 126 U/L   Total Bilirubin 0.7 <1.2 mg/dL    Comment: HEMOLYSIS AT THIS LEVEL MAY AFFECT RESULT   GFR, Estimated >60 >60 mL/min    Comment: (NOTE) Calculated using the CKD-EPI Creatinine Equation (2021)    Anion gap 13 5 - 15    Comment: Performed at [facility], [address], [place], [place] [address]  Lipase, blood     Status: None   Collection Time: 07/26/23  1:58 PM  Result Value Ref Range   Lipase 25 11 - 51 U/L    Comment: Performed at [facility], [address] [place], [place] [address]  I-stat chem 8, ED (not at [facility], [facility] or [facility])     Status: Abnormal   Collection Time: 07/26/23  2:08 PM  Result Value Ref Range   Sodium 137 135 - 145 mmol/L   Potassium 3.9 3.5 - 5.1 mmol/L   Chloride 99 98 - 111 mmol/L   BUN 17 8 - 23 mg/dL   Creatinine, Ser 0.98 0.44 - 1.00 mg/dL   Glucose, Bld 119 (H) 70 - 99 mg/dL    Comment: Glucose reference range applies only to samples taken after fasting for at least 8 hours.   Calcium, Ion 1.25 1.15 - 1.40 mmol/L   TCO2 27 22 - 32  mmol/L   Hemoglobin 16.7 (H) 12.0 - 15.0 g/dL   HCT 14.7 (H) 82.9 - 56.2 %    CT ABDOMEN PELVIS W CONTRAST  Addendum Date: 07/26/2023   ADDENDUM REPORT: 07/26/2023 17:29 ADDENDUM: Critical Value/emergent results were called by telephone at the time of interpretation on 07/26/2023 at 5:29 pm to Dr. Beckey Downing, Who verbally acknowledged these results. Electronically Signed   By: Jules Schick M.D.   On: 07/26/2023 17:29   Result Date: 07/26/2023 CLINICAL DATA:  Abdominal pain, acute, nonlocalized. EXAM: CT ABDOMEN AND PELVIS WITH CONTRAST TECHNIQUE: Multidetector CT imaging of the abdomen and pelvis was performed using the standard protocol following bolus administration of intravenous contrast. RADIATION DOSE REDUCTION: This exam was performed according to the departmental dose-optimization program which includes automated exposure control, adjustment of the mA and/or kV according to patient size and/or use of iterative reconstruction technique. CONTRAST:  OMNIPAQUE IOHEXOL 300 MG/ML  SOLN COMPARISON:  CT scan abdomen and pelvis from 04/29/2004. FINDINGS: Lower chest: There are dependent changes in the visualized lung bases. No overt consolidation. No pleural effusion. The heart is normal in size. No pericardial effusion. Hepatobiliary: The liver is normal in size. Non-cirrhotic configuration. No suspicious mass. These is mild diffuse hepatic steatosis. No intrahepatic or extrahepatic bile duct dilation. No calcified gallstones. Normal gallbladder wall thickness. No pericholecystic inflammatory changes. Pancreas: Unremarkable. No pancreatic ductal dilatation or surrounding inflammatory changes. Spleen: Within normal limits. No focal lesion. Adrenals/Urinary Tract: Adrenal glands are unremarkable. No suspicious renal mass. No hydronephrosis. No renal or ureteric calculi. Unremarkable urinary bladder. Stomach/Bowel: There is closed loop obstruction of the several loops of distal small bowel with 2  points of transition in the right lower quadrant (series 2, images 48 and 57). The bowel loops are dilated measuring up to 3.6 cm in diameter. There is significant perienteric fat stranding/fluid and prominence of vasa recta. Wall of 1 of the bowel loop (series 7, image 35) does not enhance similar to the other dilated bowel loops and highly concerning for ischemia. There is no pneumoperitoneum, pneumatosis or portal  venous gas. Vascular/Lymphatic: There is small amount of inter bowel free fluid as well as free fluid in the dependent pelvis and left paracolic gutter, likely reactive. No abdominal or pelvic lymphadenopathy, by size criteria. No aneurysmal dilation of the major abdominal arteries. There are moderate peripheral atherosclerotic vascular calcifications of the aorta and its major branches. Reproductive: The uterus is surgically absent. No large adnexal mass. Other: There is a tiny fat containing umbilical hernia. The soft tissues and abdominal wall are otherwise unremarkable. Musculoskeletal: No suspicious osseous lesions. There are moderate multilevel degenerative changes in the visualized spine. IMPRESSION: 1. Findings favor closed loop obstruction of several loops of distal small bowel, as described above, which may be due to underlying adhesions. 2. One of the dilated/obstructed bowel loop wall is not enhancing similar to the other bowel loops and highly concerning for underlying bowel wall ischemia. 3. Small reactive ascites. No pneumoperitoneum, pneumatosis or portal venous gas. 4. Multiple other nonacute observations, as described above. Electronically Signed: By: Jules Schick M.D. On: 07/26/2023 17:25    ROS - all of the below systems have been reviewed with the patient and positives are indicated with bold text General: chills, fever or night sweats Eyes: blurry vision or double vision ENT: epistaxis or sore throat Allergy/Immunology: itchy/watery eyes or nasal  congestion Hematologic/Lymphatic: bleeding problems, blood clots or swollen lymph nodes Endocrine: temperature intolerance or unexpected weight changes Breast: new or changing breast lumps or nipple discharge Resp: cough, shortness of breath, or wheezing CV: chest pain or dyspnea on exertion GI: as per HPI GU: dysuria, trouble voiding, or hematuria MSK: joint pain or joint stiffness Neuro: TIA or stroke symptoms Derm: pruritus and skin lesion changes Psych: anxiety and depression  PE Blood pressure (!) 164/87, pulse 79, temperature 97.7 F (36.5 C), temperature source Oral, resp. rate 17, height 5\' 1"  (1.549 m), weight 67.6 kg, SpO2 95%. Constitutional: NAD; conversant; no deformities Eyes: Moist conjunctiva; no lid lag; anicteric; PERRL Neck: Trachea midline; no thyromegaly Lungs: Normal respiratory effort; no tactile fremitus CV: RRR; no palpable thrills; no pitting edema GI: Abd TTP RLQ; no palpable hepatosplenomegaly MSK: Normal range of motion of extremities; no clubbing/cyanosis Psychiatric: Appropriate affect; alert and oriented x3 Lymphatic: No palpable cervical or axillary lymphadenopathy  Results for orders placed or performed during the hospital encounter of 07/26/23 (from the past 48 hour(s))  CBC with Differential     Status: Abnormal   Collection Time: 07/26/23  1:58 PM  Result Value Ref Range   WBC 16.1 (H) 4.0 - 10.5 K/uL   RBC 4.87 3.87 - 5.11 MIL/uL   Hemoglobin 15.4 (H) 12.0 - 15.0 g/dL   HCT 62.9 (H) 52.8 - 41.3 %   MCV 96.3 80.0 - 100.0 fL   MCH 31.6 26.0 - 34.0 pg   MCHC 32.8 30.0 - 36.0 g/dL   RDW 24.4 01.0 - 27.2 %   Platelets 412 (H) 150 - 400 K/uL   nRBC 0.0 0.0 - 0.2 %   Neutrophils Relative % 86 %   Neutro Abs 13.7 (H) 1.7 - 7.7 K/uL   Lymphocytes Relative 9 %   Lymphs Abs 1.5 0.7 - 4.0 K/uL   Monocytes Relative 5 %   Monocytes Absolute 0.8 0.1 - 1.0 K/uL   Eosinophils Relative 0 %   Eosinophils Absolute 0.0 0.0 - 0.5 K/uL   Basophils  Relative 0 %   Basophils Absolute 0.0 0.0 - 0.1 K/uL   Immature Granulocytes 0 %   Abs Immature  Granulocytes 0.07 0.00 - 0.07 K/uL    Comment: Performed at [facility], [address], [place], [place] [address]  Comprehensive metabolic panel     Status: Abnormal   Collection Time: 07/26/23  1:58 PM  Result Value Ref Range   Sodium 137 135 - 145 mmol/L   Potassium 3.8 3.5 - 5.1 mmol/L    Comment: HEMOLYSIS AT THIS LEVEL MAY AFFECT RESULT   Chloride 98 98 - 111 mmol/L   CO2 26 22 - 32 mmol/L   Glucose, Bld 166 (H) 70 - 99 mg/dL    Comment: Glucose reference range applies only to samples taken after fasting for at least 8 hours.   BUN 16 8 - 23 mg/dL   Creatinine, Ser 4.69 0.44 - 1.00 mg/dL   Calcium 62.9 (H) 8.9 - 10.3 mg/dL   Total Protein 7.6 6.5 - 8.1 g/dL   Albumin 4.5 3.5 - 5.0 g/dL   AST 31 15 - 41 U/L    Comment: HEMOLYSIS AT THIS LEVEL MAY AFFECT RESULT   ALT 41 0 - 44 U/L    Comment: HEMOLYSIS AT THIS LEVEL MAY AFFECT RESULT   Alkaline Phosphatase 70 38 - 126 U/L   Total Bilirubin 0.7 <1.2 mg/dL    Comment: HEMOLYSIS AT THIS LEVEL MAY AFFECT RESULT   GFR, Estimated >60 >60 mL/min    Comment: (NOTE) Calculated using the CKD-EPI Creatinine Equation (2021)    Anion gap 13 5 - 15    Comment: Performed at [facility], [address] [place], [place] [address]  Lipase, blood     Status: None   Collection Time: 07/26/23  1:58 PM  Result Value Ref Range   Lipase 25 11 - 51 U/L    Comment: Performed at [facility], [address] [place], [place] [address]  I-stat chem 8, ED (not at [facility], [facility] or [facility])     Status: Abnormal   Collection Time: 07/26/23  2:08 PM  Result Value Ref Range   Sodium 137 135 - 145 mmol/L   Potassium 3.9 3.5 - 5.1 mmol/L   Chloride 99 98 - 111 mmol/L   BUN 17 8 - 23 mg/dL   Creatinine, Ser 1.02 0.44 - 1.00 mg/dL   Glucose, Bld 725 (H) 70 - 99 mg/dL    Comment: Glucose reference range  applies only to samples taken after fasting for at least 8 hours.   Calcium, Ion 1.25 1.15 - 1.40 mmol/L   TCO2 27 22 - 32 mmol/L   Hemoglobin 16.7 (H) 12.0 - 15.0 g/dL   HCT 36.6 (H) 44.0 - 34.7 %    CT ABDOMEN PELVIS W CONTRAST  Addendum Date: 07/26/2023   ADDENDUM REPORT: 07/26/2023 17:29 ADDENDUM: Critical Value/emergent results were called by telephone at the time of interpretation on 07/26/2023 at 5:29 pm to Dr. Beckey Downing, Who verbally acknowledged these results. Electronically Signed   By: Jules Schick M.D.   On: 07/26/2023 17:29   Result Date: 07/26/2023 CLINICAL DATA:  Abdominal pain, acute, nonlocalized. EXAM: CT ABDOMEN AND PELVIS WITH CONTRAST TECHNIQUE: Multidetector CT imaging of the abdomen and pelvis was performed using the standard protocol following bolus administration of intravenous contrast. RADIATION DOSE REDUCTION: This exam was performed according to the departmental dose-optimization program which includes automated exposure control, adjustment of the mA and/or kV according to patient size and/or use of iterative reconstruction technique. CONTRAST:  OMNIPAQUE IOHEXOL 300 MG/ML  SOLN COMPARISON:  CT scan  abdomen and pelvis from 04/29/2004. FINDINGS: Lower chest: There are dependent changes in the visualized lung bases. No overt consolidation. No pleural effusion. The heart is normal in size. No pericardial effusion. Hepatobiliary: The liver is normal in size. Non-cirrhotic configuration. No suspicious mass. These is mild diffuse hepatic steatosis. No intrahepatic or extrahepatic bile duct dilation. No calcified gallstones. Normal gallbladder wall thickness. No pericholecystic inflammatory changes. Pancreas: Unremarkable. No pancreatic ductal dilatation or surrounding inflammatory changes. Spleen: Within normal limits. No focal lesion. Adrenals/Urinary Tract: Adrenal glands are unremarkable. No suspicious renal mass. No hydronephrosis. No renal or ureteric calculi.  Unremarkable urinary bladder. Stomach/Bowel: There is closed loop obstruction of the several loops of distal small bowel with 2 points of transition in the right lower quadrant (series 2, images 48 and 57). The bowel loops are dilated measuring up to 3.6 cm in diameter. There is significant perienteric fat stranding/fluid and prominence of vasa recta. Wall of 1 of the bowel loop (series 7, image 35) does not enhance similar to the other dilated bowel loops and highly concerning for ischemia. There is no pneumoperitoneum, pneumatosis or portal venous gas. Vascular/Lymphatic: There is small amount of inter bowel free fluid as well as free fluid in the dependent pelvis and left paracolic gutter, likely reactive. No abdominal or pelvic lymphadenopathy, by size criteria. No aneurysmal dilation of the major abdominal arteries. There are moderate peripheral atherosclerotic vascular calcifications of the aorta and its major branches. Reproductive: The uterus is surgically absent. No large adnexal mass. Other: There is a tiny fat containing umbilical hernia. The soft tissues and abdominal wall are otherwise unremarkable. Musculoskeletal: No suspicious osseous lesions. There are moderate multilevel degenerative changes in the visualized spine. IMPRESSION: 1. Findings favor closed loop obstruction of several loops of distal small bowel, as described above, which may be due to underlying adhesions. 2. One of the dilated/obstructed bowel loop wall is not enhancing similar to the other bowel loops and highly concerning for underlying bowel wall ischemia. 3. Small reactive ascites. No pneumoperitoneum, pneumatosis or portal venous gas. 4. Multiple other nonacute observations, as described above. Electronically Signed: By: Jules Schick M.D. On: 07/26/2023 17:25    I have personally reviewed the relevant images and lab work.     A/P: RAYNIE ROSENFELD is an 72 y.o. female with what appears to be a closed loop bowel obstruction  and concern for ischemia.  I have recommended emergent surgery with laparoscopic lysis of adhesion and possible open surgery and bowel resection.  Risks include bleeding, infection, pain, hernia, leak of surgical connections, need for additional procedures and damage to adjacent structures.  All questions were answered.  Pt agrees to proceed.   I spent a total of 90 minutes in both face-to-face and non-face-to-face activities, excluding procedures performed, for this visit on the date of this encounter.  Due to her life threatening diagnosis and multiple medical problems, this case requires a high level of medical decision making.  Vanita Panda, MD  Colorectal and General Surgery [facility]

## 2023-07-26 NOTE — Op Note (Signed)
07/26/2023  8:04 PM  PATIENT:  Laura Bradshaw  72 y.o. female  Patient Care Team: Hite, Jackquline [place], FNP as PCP - General (Family Medicine)  PRE-OPERATIVE DIAGNOSIS:  Closed loop bowel obstruction   POST-OPERATIVE DIAGNOSIS:  Perforated closed loop bowel obstruction with ischemic jejunum  PROCEDURE:  LAPAROSCOPY DIAGNOSTIC CONVERTED TO OPEN SMALL BOWEL RESECTION    Surgeon(s): Romie Levee, MD  ASSISTANT: none   ANESTHESIA:   general  EBL: Total I/O In: 550 [IV Piggyback:550] Out: 200 [Urine:100; Blood:100]  DRAINS: none   SPECIMEN:  Source of Specimen:  jejunum, distal  DISPOSITION OF SPECIMEN:  PATHOLOGY  COUNTS:  YES  PLAN OF CARE: Admit to inpatient   PATIENT DISPOSITION:  PACU - hemodynamically stable.  INDICATION: 72 year old female with abdominal pain and leukocytosis with CT scan concerning for closed-loop bowel obstruction.  I recommended emergent surgery.   OR FINDINGS: Closed-loop bowel obstruction caused by omental adhesions to Pfannenstiel incision with small bowel ischemia and perforation.  DESCRIPTION: the patient was identified in the preoperative holding area and taken to the OR where they were laid supine on the operating room table.  General anesthesia was induced without difficulty. SCDs were also noted to be in place prior to the initiation of anesthesia.  The patient was then prepped and draped in the usual sterile fashion.   A surgical timeout was performed indicating the correct patient, procedure, positioning and need for preoperative antibiotics.   I began by making a small infraumbilical incision using a 15 blade scalpel.  This was carried down through the subcutaneous tissues bluntly.  The fascia was incised at midline and a Tresa Endo was used to bluntly entered the peritoneum.  A foul-smelling purulent material was encountered.  This was aspirated and a 15 mm Hassan port was placed.  2 more 5 mm ports were placed in the left upper and  left lower quadrants.  I inserted the camera and evaluated the bowel in the right lower quadrant.  There were several loops of bowel that appeared ischemic.  I gently lifted these up and it appeared to be wrapped around an omental adhesion to her previous Pfannenstiel incision.  Upon further inspection I saw that 1 of these loops of bowel was leaking GI contents causing feculent and purulent ascites in the right lower quadrant.  I decided to convert to an open procedure.  I enlarged the midline incision and placed a wound protector.  I brought out the small bowel that was compromised.  I divided the omental adhesions to the Pfannenstiel incision fascia using a LigaSure device.  I then divided the proximal and distal portions of the small bowel that appeared ischemic using a 75 mm GIA blue load stapler.  The mesentery was divided using the LigaSure device.  I then created an anastomosis between the 2 remaining pieces of small bowel using another 75 mm GIA stapler and a 60 mm TA stapler to close the common enterotomy.  An antitension suture was placed using a 3-0 silk suture.  The mesenteric defect was closed using a running 2-0 silk suture.  The bowel was inspected and hemostasis was good.  This was then placed back into the abdomen.  All the remaining omental adhesions were transected to avoid future obstructions.  I then ran the small bowel from the terminal ileum to the ligament of Treitz to confirm no other obstructions.  I palpated the NG tube within the stomach.  The abdomen was then irrigated with 2 L of  warm normal saline.  The fascia was then closed using 2, #1 PDS running sutures.  The skin was left open and packed with a Kerlix sponge.  A sterile dressing was applied.  The remaining port sites were closed using Dermabond.  The patient was then awakened from anesthesia and sent to the postanesthesia care unit in stable condition.  All counts were correct per operating room staff.  Vanita Panda,  MD  Colorectal and General Surgery [facility]

## 2023-07-26 NOTE — ED Triage Notes (Signed)
[facility] BIB [facility] for abdominal pain. Patient stated that it started around 3 am this moring.Pt presents with abdominal distention and absent bowel sounds.

## 2023-07-26 NOTE — Progress Notes (Signed)
ED Pharmacy Antibiotic Sign Off An antibiotic consult was received from an ED provider for zosyn per pharmacy dosing for IAI. A chart review was completed to assess appropriateness.   The following one time order(s) were placed:  Zosyn 3.375g   Further antibiotic and/or antibiotic pharmacy consults should be ordered by the admitting provider if indicated.   Thank you for allowing pharmacy to be a part of this patient's care.   Berkley Harvey, [facility]  Clinical Pharmacist 07/26/23 5:36 PM

## 2023-07-26 NOTE — ED Provider Notes (Signed)
72 year old for female presents the emergency department today with abdominal pain.  The patient has labs and CT scan pending at the time of signout.  Will reevaluate after her results come back for ultimate disposition.  Physical Exam  BP (!) 164/87 (BP Location: Left Arm)   Pulse 79   Temp 97.7 F (36.5 C) (Oral)   Resp 17   Ht 5\' 1"  (1.549 m)   Wt 67.6 kg   SpO2 95%   BMI 28.15 kg/m   Physical Exam General: No acute distress Abdominal: The patient is tender diffusely with maximal tenderness over the right lower quadrant.  She does have some guarding to deep palpation over the right lower quadrant  Procedures  Procedures  ED Course / MDM    Medical Decision Making Amount and/or Complexity of Data Reviewed Labs: ordered. Radiology: ordered.  Risk Prescription drug management. Decision regarding hospitalization.   I was called by radiology.  The patient does have imaging findings concerning for closed-loop small bowel obstruction.  A call discussed this with surgery.  They recommend medical admission and will take the patient to surgery this evening.  An NG tube is ordered.  The patient does have a leukocytosis.  She is given Zosyn.  Lactic acid is also added on.  She is admitted for further evaluation management.       Durwin Glaze, MD 07/26/23 [phone]

## 2023-07-26 NOTE — Anesthesia Procedure Notes (Signed)
Procedure Name: Intubation Date/Time: 07/26/2023 6:56 PM  Performed by: Maurene Capes, CRNAPre-anesthesia Checklist: Patient identified, Emergency Drugs available, Suction available, Patient being monitored and Timeout performed Patient Re-evaluated:Patient Re-evaluated prior to induction Oxygen Delivery Method: Circle System Utilized Preoxygenation: Pre-oxygenation with 100% oxygen Induction Type: IV induction Ventilation: Mask ventilation without difficulty Laryngoscope Size: Mac and 3 Grade View: Grade II Tube type: Oral Tube size: 7.5 mm Number of attempts: 1 Airway Equipment and Method: Stylet Placement Confirmation: ETT inserted through vocal cords under direct vision, positive ETCO2 and breath sounds checked- equal and bilateral Secured at: 20 cm Tube secured with: Tape Dental Injury: Teeth and Oropharynx as per pre-operative assessment

## 2023-07-26 NOTE — Progress Notes (Signed)
Date and time results received: 07/26/23 2232 (use smartphrase ".now" to insert current time)  Test: Lactic Acid Critical Value: 3.7  Name of Provider Notified: Virgel Manifold, NP  Orders Received? Or Actions Taken?: Awaiting Further Orders

## 2023-07-27 ENCOUNTER — Encounter (HOSPITAL_COMMUNITY): Payer: Self-pay | Admitting: General Surgery

## 2023-07-27 DIAGNOSIS — K56609 Unspecified intestinal obstruction, unspecified as to partial versus complete obstruction: Secondary | ICD-10-CM | POA: Diagnosis not present

## 2023-07-27 LAB — RENAL FUNCTION PANEL
Albumin: 3.7 g/dL (ref 3.5–5.0)
Anion gap: 9 (ref 5–15)
BUN: 17 mg/dL (ref 8–23)
CO2: 20 mmol/L — ABNORMAL LOW (ref 22–32)
Calcium: 8.7 mg/dL — ABNORMAL LOW (ref 8.9–10.3)
Chloride: 106 mmol/L (ref 98–111)
Creatinine, Ser: 0.81 mg/dL (ref 0.44–1.00)
GFR, Estimated: >60 mL/min (ref >60)
Glucose, Bld: 135 mg/dL — ABNORMAL HIGH (ref 70–99)
Phosphorus: 3.1 mg/dL (ref 2.5–4.6)
Potassium: 3.4 mmol/L — ABNORMAL LOW (ref 3.5–5.1)
Sodium: 135 mmol/L (ref 135–145)

## 2023-07-27 LAB — CBC
HCT: 42.6 % (ref 36.0–46.0)
Hemoglobin: 14 g/dL (ref 12.0–15.0)
MCH: 32.2 pg (ref 26.0–34.0)
MCHC: 32.9 g/dL (ref 30.0–36.0)
MCV: 97.9 fL (ref 80.0–100.0)
Platelets: 363 10*3/uL (ref 150–400)
RBC: 4.35 MIL/uL (ref 3.87–5.11)
RDW: 13.7 % (ref 11.5–15.5)
WBC: 3.6 10*3/uL — ABNORMAL LOW (ref 4.0–10.5)
nRBC: 0 % (ref 0.0–0.2)

## 2023-07-27 LAB — LACTIC ACID, PLASMA: Lactic Acid, Venous: 2.1 mmol/L (ref 0.5–1.9)

## 2023-07-27 LAB — MAGNESIUM: Magnesium: 1.8 mg/dL (ref 1.7–2.4)

## 2023-07-27 MED ORDER — SODIUM CHLORIDE 0.9 % IV SOLN: INTRAVENOUS | Status: DC

## 2023-07-27 MED ORDER — METHOCARBAMOL 1000 MG/10ML IJ SOLN
500.0000 mg | Freq: Three times a day (TID) | INTRAMUSCULAR | Status: DC
  Administered 2023-07-27 – 2023-08-01 (×15): 500 mg via INTRAVENOUS
  Filled 2023-07-27 (×16): qty 10

## 2023-07-27 MED ORDER — FLUTICASONE PROPIONATE 50 MCG/ACT NA SUSP: 2.0000 | NASAL | Status: DC | PRN

## 2023-07-27 MED ORDER — KCL IN DEXTROSE-NACL 40-5-0.45 MEQ/L-%-% IV SOLN
INTRAVENOUS | Status: AC
  Filled 2023-07-27 (×2): qty 1000

## 2023-07-27 MED ORDER — ACETAMINOPHEN 10 MG/ML IV SOLN
1000.0000 mg | Freq: Four times a day (QID) | INTRAVENOUS | Status: AC
  Administered 2023-07-27 (×2): 1000 mg via INTRAVENOUS
  Filled 2023-07-27 (×4): qty 100

## 2023-07-27 MED ORDER — ALBUTEROL SULFATE (2.5 MG/3ML) 0.083% IN NEBU
3.0000 mL | INHALATION_SOLUTION | RESPIRATORY_TRACT | Status: DC | PRN
  Administered 2023-07-27 – 2023-07-30 (×2): 3 mL via RESPIRATORY_TRACT
  Filled 2023-07-27 (×3): qty 3

## 2023-07-27 MED ORDER — LORAZEPAM 2 MG/ML IJ SOLN
0.2500 mg | Freq: Four times a day (QID) | INTRAMUSCULAR | Status: DC | PRN
  Administered 2023-07-27 – 2023-07-28 (×2): 0.25 mg via INTRAVENOUS
  Filled 2023-07-27 (×2): qty 1

## 2023-07-27 MED ORDER — LACTATED RINGERS IV BOLUS
1000.0000 mL | Freq: Once | INTRAVENOUS | Status: AC
  Administered 2023-07-27: 1000 mL via INTRAVENOUS

## 2023-07-27 MED ORDER — POTASSIUM CHLORIDE 10 MEQ/100ML IV SOLN
10.0000 meq | INTRAVENOUS | Status: AC
Start: 2023-07-27 — End: 2023-07-27
  Administered 2023-07-27 (×2): 10 meq via INTRAVENOUS
  Filled 2023-07-27 (×2): qty 100

## 2023-07-27 NOTE — Evaluation (Signed)
Physical Therapy Evaluation Patient Details Name: Laura Bradshaw MRN: [id number] DOB: 02-15-1951 Today's Date: 07/27/2023  History of Present Illness  Pt is 72 yo female admitted on 07/26/23 with SBO, perforation, jejunal ischemia and is s/p open small bowel resection on 07/26/23.  Midline wound open - possible wound vac placement next week.  Pt with hx including but not limited to HTN, anxiety, depression, COPD, HSV, low back pain, mild dementia.  Clinical Impression  Pt admitted with above diagnosis. AT baseline, pt is independent and lives alone.  Dtr present and reports plans for pt to return to her home at d/c.  Pt motivated to work with therapy.  Educated on log roll technique for abdominal surgery.  Pt needing mod A to lift trunk.  Did stand and take a couple steps at EOB with HHA of 2 - further limited due to NG tube and POD#1 with open incision.  Pt expected to progress well and be able to return home with dtr with HHPT as long as she progresses as expected.   Pt currently with functional limitations due to the deficits listed below (see PT Problem List). Pt will benefit from acute skilled PT to increase their independence and safety with mobility to allow discharge.           If plan is discharge home, recommend the following: A lot of help with walking and/or transfers;A lot of help with bathing/dressing/bathroom;Assistance with cooking/housework;Help with stairs or ramp for entrance   Can travel by private vehicle        Equipment Recommendations Rolling walker (2 wheels)  Recommendations for Other Services       Functional Status Assessment Patient has had a recent decline in their functional status and demonstrates the ability to make significant improvements in function in a reasonable and predictable amount of time.     Precautions / Restrictions Precautions Precautions: Fall Precaution Comments: open abdominal incision; NG tube      Mobility  Bed Mobility Overal bed  mobility: Needs Assistance Bed Mobility: Rolling, Sidelying to Sit, Sit to Sidelying Rolling: Min assist Sidelying to sit: Mod assist, HOB elevated, Used rails     Sit to sidelying: Mod assist, HOB elevated, Used rails General bed mobility comments: Educated on log roll for abdominal comfort, utlized bed to assist, good effort from pt wanting to do what she could on her own, did provide mod A to lift trunk up and for legs back to bed for comfort and decreased abdominal strain    Transfers Overall transfer level: Needs assistance Equipment used: 2 person hand held assist Transfers: Sit to/from Stand Sit to Stand: Min assist, +2 safety/equipment           General transfer comment: Stood at EOB with HHA of 2    Ambulation/Gait Ambulation/Gait assistance: Min assist, +2 safety/equipment Gait Distance (Feet): 3 Feet Assistive device: 2 person hand held assist Gait Pattern/deviations: Step-to pattern, Decreased stride length Gait velocity: decreased     General Gait Details: Side steps at EOB; further limited due to NG tube/POD1  Stairs            Wheelchair Mobility     Tilt Bed    Modified Rankin (Stroke Patients Only)       Balance Overall balance assessment: Needs assistance Sitting-balance support: No upper extremity supported Sitting balance-Leahy Scale: Fair Sitting balance - Comments: did not challenge   Standing balance support: Bilateral upper extremity supported Standing balance-Leahy Scale: Poor Standing balance comment: Min  A with UE support                             Pertinent Vitals/Pain Pain Assessment Pain Assessment: 0-10 Pain Score: 4  Pain Location: abdomen Pain Descriptors / Indicators: Discomfort Pain Intervention(s): Limited activity within patient's tolerance, Monitored during session    Home Living Family/patient expects to be discharged to:: Private residence Living Arrangements: Alone Available Help at  Discharge: Family;Available 24 hours/day Type of Home: House Home Access: Stairs to enter Entrance Stairs-Rails: Doctor, general practice of Steps: 2   Home Layout: One level Home Equipment: None Additional Comments: plans to go home with dtr    Prior Function Prior Level of Function : Independent/Modified Independent;Driving             Mobility Comments: could ambulate in community ADLs Comments: independent with adls and iadls     Extremity/Trunk Assessment   Upper Extremity Assessment Upper Extremity Assessment: Generalized weakness (ROM WFL; MMT: 3/5 not further tested due to abdominal sx)    Lower Extremity Assessment Lower Extremity Assessment: Generalized weakness (ROM WFL; MMT: 3/5 not further tested due to abdominal sx)    Cervical / Trunk Assessment Cervical / Trunk Assessment: Normal  Communication      Cognition Arousal: Alert Behavior During Therapy: WFL for tasks assessed/performed Overall Cognitive Status: Within Functional Limits for tasks assessed                                 General Comments: Daughter present        General Comments General comments (skin integrity, edema, etc.): VSS    Exercises     Assessment/Plan    PT Assessment Patient needs continued PT services  PT Problem List Decreased strength;Decreased range of motion;Decreased activity tolerance;Decreased balance;Decreased mobility;Decreased safety awareness;Decreased knowledge of use of DME       PT Treatment Interventions DME instruction;Therapeutic exercise;Gait training;Stair training;Functional mobility training;Therapeutic activities;Patient/family education;Neuromuscular re-education;Modalities;Balance training    PT Goals (Current goals can be found in the Care Plan section)  Acute Rehab PT Goals Patient Stated Goal: return home w dtr PT Goal Formulation: With patient/family Time For Goal Achievement: 08/10/23 Potential to Achieve Goals:  Good    Frequency Min 1X/week     Co-evaluation               AM-PAC PT "6 Clicks" Mobility  Outcome Measure Help needed turning from your back to your side while in a flat bed without using bedrails?: A Little Help needed moving from lying on your back to sitting on the side of a flat bed without using bedrails?: A Lot Help needed moving to and from a bed to a chair (including a wheelchair)?: A Lot Help needed standing up from a chair using your arms (e.g., wheelchair or bedside chair)?: A Lot Help needed to walk in hospital room?: Total Help needed climbing 3-5 steps with a railing? : Total 6 Click Score: 11    End of Session   Activity Tolerance: Patient tolerated treatment well Patient left: in bed;with call bell/phone within reach;with bed alarm set;with family/visitor present;with restraints reapplied (mitts in place) Nurse Communication: Mobility status PT Visit Diagnosis: Other abnormalities of gait and mobility (R26.89);Muscle weakness (generalized) (M62.81)    Time: 1610-9604 PT Time Calculation (min) (ACUTE ONLY): 23 min   Charges:   PT Evaluation $PT Eval Moderate Complexity: 1  Mod PT Treatments $Therapeutic Activity: 8-22 mins PT General Charges $$ ACUTE PT VISIT: 1 Visit         Anise Salvo, PT Acute Rehab [facility] [phone]   Rayetta Humphrey 07/27/2023, 5:14 PM

## 2023-07-27 NOTE — Progress Notes (Signed)
Notified Virgel Manifold, NP regarding repeat lactic acid=2.1.

## 2023-07-27 NOTE — Progress Notes (Signed)
Progress Note  1 Day Post-Op  Subjective: Pt sleepy this AM but awakened to voice and able to talk some, family member at bedside. Some abdominal pain but comfortable at rest. Discussed operative findings and general expected post-op course.   Objective: Vital signs in last 24 hours: Temp:  [97.7 F (36.5 C)-98.7 F (37.1 C)] 98.7 F (37.1 C) (12/06 0516) Pulse Rate:  [40-97] 97 (12/06 0516) Resp:  [14-21] 18 (12/06 0516) BP: (106-164)/(67-88) 108/67 (12/06 0516) SpO2:  [92 %-100 %] 100 % (12/06 0516) Weight:  [67.6 kg] 67.6 kg (12/05 1342) Last BM Date : 07/26/23  Intake/Output from previous day: 12/05 0701 - 12/06 0700 In: 2255.9 [I.V.:702.3; IV Piggyback:1553.6] Out: 650 [Urine:550; Blood:100] Intake/Output this shift: No intake/output data recorded.  PE: General: pleasant, WD, WN female who is laying in bed in NAD Heart: regular, rate, and rhythm.  Lungs: Respiratory effort nonlabored Abd: soft, appropriately ttp, midline wound clean, NGT with bilious drainage, ND   Lab Results:  Recent Labs    07/26/23 1358 07/26/23 1408 07/27/23 0253  WBC 16.1*  --  3.6*  HGB 15.4* 16.7* 14.0  HCT 46.9* 49.0* 42.6  PLT 412*  --  363   BMET Recent Labs    07/26/23 1358 07/26/23 1408 07/27/23 0253  NA 137 137 135  K 3.8 3.9 3.4*  CL 98 99 106  CO2 26  --  20*  GLUCOSE 166* 170* 135*  BUN 16 17 17   CREATININE 0.67 0.60 0.81  CALCIUM 10.5*  --  8.7*   PT/INR No results for input(s): "LABPROT", "INR" in the last 72 hours. CMP     Component Value Date/Time   NA 135 07/27/2023 0253   K 3.4 (L) 07/27/2023 0253   CL 106 07/27/2023 0253   CO2 20 (L) 07/27/2023 0253   GLUCOSE 135 (H) 07/27/2023 0253   BUN 17 07/27/2023 0253   CREATININE 0.81 07/27/2023 0253   CALCIUM 8.7 (L) 07/27/2023 0253   PROT 7.6 07/26/2023 1358   ALBUMIN 3.7 07/27/2023 0253   AST 31 07/26/2023 1358   ALT 41 07/26/2023 1358   ALKPHOS 70 07/26/2023 1358   BILITOT 0.7 07/26/2023 1358    GFRNONAA >60 07/27/2023 0253   GFRAA >60 01/06/2018 1315   Lipase     Component Value Date/Time   LIPASE 25 07/26/2023 1358       Studies/Results: CT ABDOMEN PELVIS W CONTRAST  Addendum Date: 07/26/2023   ADDENDUM REPORT: 07/26/2023 17:29 ADDENDUM: Critical Value/emergent results were called by telephone at the time of interpretation on 07/26/2023 at 5:29 pm to Dr. Beckey Downing, Who verbally acknowledged these results. Electronically Signed   By: Jules Schick M.D.   On: 07/26/2023 17:29   Result Date: 07/26/2023 CLINICAL DATA:  Abdominal pain, acute, nonlocalized. EXAM: CT ABDOMEN AND PELVIS WITH CONTRAST TECHNIQUE: Multidetector CT imaging of the abdomen and pelvis was performed using the standard protocol following bolus administration of intravenous contrast. RADIATION DOSE REDUCTION: This exam was performed according to the departmental dose-optimization program which includes automated exposure control, adjustment of the mA and/or kV according to patient size and/or use of iterative reconstruction technique. CONTRAST:  OMNIPAQUE IOHEXOL 300 MG/ML  SOLN COMPARISON:  CT scan abdomen and pelvis from 04/29/2004. FINDINGS: Lower chest: There are dependent changes in the visualized lung bases. No overt consolidation. No pleural effusion. The heart is normal in size. No pericardial effusion. Hepatobiliary: The liver is normal in size. Non-cirrhotic configuration. No suspicious mass. These is  mild diffuse hepatic steatosis. No intrahepatic or extrahepatic bile duct dilation. No calcified gallstones. Normal gallbladder wall thickness. No pericholecystic inflammatory changes. Pancreas: Unremarkable. No pancreatic ductal dilatation or surrounding inflammatory changes. Spleen: Within normal limits. No focal lesion. Adrenals/Urinary Tract: Adrenal glands are unremarkable. No suspicious renal mass. No hydronephrosis. No renal or ureteric calculi. Unremarkable urinary bladder. Stomach/Bowel: There is  closed loop obstruction of the several loops of distal small bowel with 2 points of transition in the right lower quadrant (series 2, images 48 and 57). The bowel loops are dilated measuring up to 3.6 cm in diameter. There is significant perienteric fat stranding/fluid and prominence of vasa recta. Wall of 1 of the bowel loop (series 7, image 35) does not enhance similar to the other dilated bowel loops and highly concerning for ischemia. There is no pneumoperitoneum, pneumatosis or portal venous gas. Vascular/Lymphatic: There is small amount of inter bowel free fluid as well as free fluid in the dependent pelvis and left paracolic gutter, likely reactive. No abdominal or pelvic lymphadenopathy, by size criteria. No aneurysmal dilation of the major abdominal arteries. There are moderate peripheral atherosclerotic vascular calcifications of the aorta and its major branches. Reproductive: The uterus is surgically absent. No large adnexal mass. Other: There is a tiny fat containing umbilical hernia. The soft tissues and abdominal wall are otherwise unremarkable. Musculoskeletal: No suspicious osseous lesions. There are moderate multilevel degenerative changes in the visualized spine. IMPRESSION: 1. Findings favor closed loop obstruction of several loops of distal small bowel, as described above, which may be due to underlying adhesions. 2. One of the dilated/obstructed bowel loop wall is not enhancing similar to the other bowel loops and highly concerning for underlying bowel wall ischemia. 3. Small reactive ascites. No pneumoperitoneum, pneumatosis or portal venous gas. 4. Multiple other nonacute observations, as described above. Electronically Signed: By: Jules Schick M.D. On: 07/26/2023 17:25    Anti-infectives: Anti-infectives (From admission, onward)    Start     Dose/Rate Route Frequency Ordered Stop   07/26/23 1745  piperacillin-tazobactam (ZOSYN) IVPB 3.375 g        3.375 g 12.5 mL/hr over 240 Minutes  Intravenous  Once 07/26/23 1737 07/26/23 1931        Assessment/Plan  Closed loop SBO with ischemic small bowel and perforation  POD1 s/p laparoscopy converted to open small bowel resection  - AROBF, continue NGT to LIWS  - ok to have limited ice chips for comfort with NGT in  - Continue IV abx for 5 days in setting of perforation - pt high risk for post-op abscess but will monitor for signs of this - BID WTD to midline wound, may consider VAC placement Monday if wound looks ok  - mobilize as tolerated - foley could come out later today if able to get up  - add scheduled IV tylenol and IV robaxin for pain control   FEN: NPO, IVF, NGT to LIWS VTE: LMWH ID: Zosyn 12/5>>  LOS: 1 day      Juliet Rude, [facility] 07/27/2023, 8:54 AM Please see Amion for pager number during day hours 7:00am-4:30pm

## 2023-07-27 NOTE — Progress Notes (Signed)
Pt has urine output since 2158, pt was NPO and received 1L of NS. Bladder scan=56ml. Virgel Manifold, NP made aware thru secure chat.

## 2023-07-27 NOTE — Progress Notes (Signed)
Pt's NG tube was pulled out by the pt while PACU nurse is transferring the pt. Virgel Manifold, NP made aware thru secure chat and this RN asked if she can order XR for placement verification. Received a reply to gently push back the tube to original position. Then, after pushing back at 60cm, noted to have gastric contents in the tubing. Virgel Manifold, NP made aware thru secure chat and received a reply ok to resume low intermittent suction. Will continue to monitor.

## 2023-07-27 NOTE — Progress Notes (Signed)
[facility] PROGRESS NOTE  Laura Bradshaw (DOB: 1951-05-04) ZHY:[id number] PCP: Alberteen Sam, FNP  Brief Narrative: Laura Bradshaw is a 72 y.o. female with PMH of HTN, anxiety, depression, COPD, HSV, low back pain and mild dementia presenting with acute abdominal pain and vomiting. She was found to have closed-loop bowel obstruction and ischemia, taken to the OR for laparoscopic, converted to open, small bowel resection with intraoperative findings of perforated closed loop obstruction with ischemic jejunum.  Subjective: Abdominal pain is controlled, she's thirsty and wants ice chips. No other complaints.   Objective: BP (!) 91/50 (BP Location: Left Arm)   Pulse 99   Temp 98.3 F (36.8 C)   Resp 15   Ht 5\' 1"  (1.549 m)   Wt 67.6 kg   SpO2 90%   BMI 28.15 kg/m   Gen: Elderly female in no distress HEENT: Dirty mucous membranes Pulm: Clear, nonlabored  CV: RRR GI: Soft, slightly distended but not rigid, tender diffusely, hypoactive BS Neuro: Alert and interactive, MAE. Ext: Warm, no deformities Skin: Abdominal dressing c/d/i  Assessment & Plan: Small bowel obstruction, perforation, jejunal ischemia. s/p resection 12/5 by Dr. Maisie Fus.  - Continue NGT to suction, ice chips ok per surgery. High risk of ileus.  - Continue MIVF, add K for hypokalemia.    HTN: - Consider as needed antihypertensive meds, currently soft BP. Give bolus,    Anxiety and depression: Stable - Resume home meds once able to take p.o. In the meantime, in the event that she develops any withdrawal symptoms or severe anxiety, ordered low dose IV ativan prn.    Mild hypercalcemia: Likely due to dehydration, resolved.   Leukocytosis:Resolved, defer need for ongoing antibiotics to surgery. Blood cultures are NGTD   Hyperglycemia: Mild.  No formal diagnosis of diabetes. -Check hemoglobin A1c  Tyrone Nine, MD [facility] [email or web address] 07/27/2023, 2:27 PM

## 2023-07-27 NOTE — Plan of Care (Signed)

## 2023-07-28 DIAGNOSIS — K56609 Unspecified intestinal obstruction, unspecified as to partial versus complete obstruction: Secondary | ICD-10-CM | POA: Diagnosis not present

## 2023-07-28 LAB — CBC
HCT: 36.1 % (ref 36.0–46.0)
Hemoglobin: 11.4 g/dL — ABNORMAL LOW (ref 12.0–15.0)
MCH: 31.7 pg (ref 26.0–34.0)
MCHC: 31.6 g/dL (ref 30.0–36.0)
MCV: 100.3 fL — ABNORMAL HIGH (ref 80.0–100.0)
Platelets: 296 10*3/uL (ref 150–400)
RBC: 3.6 MIL/uL — ABNORMAL LOW (ref 3.87–5.11)
RDW: 14.1 % (ref 11.5–15.5)
WBC: 15.4 10*3/uL — ABNORMAL HIGH (ref 4.0–10.5)
nRBC: 0 % (ref 0.0–0.2)

## 2023-07-28 LAB — BASIC METABOLIC PANEL
Anion gap: 7 (ref 5–15)
BUN: 23 mg/dL (ref 8–23)
CO2: 23 mmol/L (ref 22–32)
Calcium: 8.8 mg/dL — ABNORMAL LOW (ref 8.9–10.3)
Chloride: 106 mmol/L (ref 98–111)
Creatinine, Ser: 0.76 mg/dL (ref 0.44–1.00)
GFR, Estimated: >60 mL/min (ref >60)
Glucose, Bld: 151 mg/dL — ABNORMAL HIGH (ref 70–99)
Potassium: 4.2 mmol/L (ref 3.5–5.1)
Sodium: 136 mmol/L (ref 135–145)

## 2023-07-28 MED ORDER — KCL IN DEXTROSE-NACL 40-5-0.45 MEQ/L-%-% IV SOLN
INTRAVENOUS | Status: DC
  Filled 2023-07-28 (×2): qty 1000

## 2023-07-28 MED ORDER — NICOTINE 21 MG/24HR TD PT24
21.0000 mg | MEDICATED_PATCH | Freq: Every day | TRANSDERMAL | Status: DC
  Administered 2023-07-28 – 2023-08-12 (×16): 21 mg via TRANSDERMAL
  Filled 2023-07-28 (×16): qty 1

## 2023-07-28 MED ORDER — ACETAMINOPHEN 10 MG/ML IV SOLN
1000.0000 mg | Freq: Three times a day (TID) | INTRAVENOUS | Status: AC
  Administered 2023-07-28 – 2023-07-29 (×3): 1000 mg via INTRAVENOUS
  Filled 2023-07-28 (×3): qty 100

## 2023-07-28 MED ORDER — PIPERACILLIN-TAZOBACTAM 3.375 G IVPB
3.3750 g | Freq: Three times a day (TID) | INTRAVENOUS | Status: DC
  Administered 2023-07-28 – 2023-08-10 (×39): 3.375 g via INTRAVENOUS
  Filled 2023-07-28 (×39): qty 50

## 2023-07-28 MED ORDER — MORPHINE SULFATE (PF) 2 MG/ML IV SOLN
1.0000 mg | Freq: Once | INTRAVENOUS | Status: AC | PRN
  Administered 2023-07-29: 1 mg via INTRAVENOUS
  Filled 2023-07-28: qty 1

## 2023-07-28 NOTE — Progress Notes (Addendum)
[facility] PROGRESS NOTE  Laura Bradshaw (DOB: July 16, 1951) UXL:[id number] PCP: Alberteen Sam, FNP  Brief Narrative: Laura Bradshaw is a 72 y.o. female with PMH of HTN, anxiety, depression, COPD, HSV, low back pain and mild dementia presenting with acute abdominal pain and vomiting. She was found to have closed-loop bowel obstruction and ischemia, taken to the OR for laparoscopic, converted to open, small bowel resection with intraoperative findings of perforated closed loop obstruction with ischemic jejunum.  Subjective: Loopy with dilaudid but complaining of significant abdominal pain. No NG output recorded. She denies nausea. Daughter at bedside.  Objective: BP (!) 147/61 (BP Location: Right Arm)   Pulse (!) 105   Temp 98.2 F (36.8 C)   Resp 20   Ht 5\' 1"  (1.549 m)   Wt 67.6 kg   SpO2 93%   BMI 28.15 kg/m   Gen: No distress Pulm: Clear, nonlabored anterolaterally  CV: RRR, no MRG, no pitting edema GI: Softer, less distended, wound not reexamined Neuro: Alert and interactive, incompletely oriented. No new focal deficits.  Assessment & Plan: Small bowel obstruction, perforation, jejunal ischemia: s/p resection 12/5 by Dr. Maisie Fus.  - Continue NGT to suction, ice chips ok per surgery. High risk of ileus.  - Continue IVF, metabolic parameters stabilizing/improved.  - Surg path pending. - Continue dilaudid 0.5-1mg  IV prn pain. Has allergy to codeine, can't take po. Delirium precautions.   HTN: - Stable.    Anxiety and depression: Stable - Resume home meds once able to take p.o. In the meantime, in the event that she develops any withdrawal symptoms or severe anxiety, ordered low dose IV ativan prn.    Mild hypercalcemia: Likely due to dehydration, resolved.   Leukocytosis: Resolved, then recurrent postoperatively.  - Restart zosyn pending cultures, no operative culture sent. - Blood cultures are NGTD   Hyperglycemia, prediabetes: HbA1c 6.1%.  - Outpatient  follow up recommended.   Tyrone Nine, MD [facility] [email or web address] 07/28/2023, 2:50 PM

## 2023-07-28 NOTE — Plan of Care (Signed)
  Problem: Education: Goal: Knowledge of General Education information will improve Description Including pain rating scale, medication(s)/side effects and non-pharmacologic comfort measures Outcome: Progressing   

## 2023-07-28 NOTE — Progress Notes (Signed)
PHARMACY NOTE -  Zosyn  Pharmacy has been assisting with dosing of Zosyn for intra-abdominal infection. Dosage remains stable at 3.375 g IV q8 hr and further renal adjustments per institutional Pharmacy antibiotic protocol  Pharmacy will sign off, following peripherally for culture results, dose adjustments, and length of therapy. Please reconsult if a change in clinical status warrants re-evaluation of dosage.  Bernadene Person, PharmD, BCPS [phone] 07/28/2023, 12:32 PM

## 2023-07-28 NOTE — Progress Notes (Signed)
2 Days Post-Op   Subjective/Chief Complaint: Confused likely secondary to pain meds Family at the bedside  Objective: Vital signs in last 24 hours: Temp:  [98.2 F (36.8 C)-98.3 F (36.8 C)] 98.2 F (36.8 C) (12/07 0500) Pulse Rate:  [99-105] 105 (12/07 0500) Resp:  [15-20] 20 (12/07 0500) BP: (91-147)/(50-61) 147/61 (12/07 0500) SpO2:  [90 %-94 %] 93 % (12/07 0500) Last BM Date : 07/25/23  Intake/Output from previous day: 12/06 0701 - 12/07 0700 In: 0  Out: 900 [Urine:900] Intake/Output this shift: No intake/output data recorded.  Exam: Appears sedated but comfortable Abdomen soft, non-distended, NG drainage bilious  Lab Results:  Recent Labs    07/27/23 0253 07/28/23 0523  WBC 3.6* 15.4*  HGB 14.0 11.4*  HCT 42.6 36.1  PLT 363 296   BMET Recent Labs    07/27/23 0253 07/28/23 0523  NA 135 136  K 3.4* 4.2  CL 106 106  CO2 20* 23  GLUCOSE 135* 151*  BUN 17 23  CREATININE 0.81 0.76  CALCIUM 8.7* 8.8*   PT/INR No results for input(s): "LABPROT", "INR" in the last 72 hours. ABG No results for input(s): "PHART", "HCO3" in the last 72 hours.  Invalid input(s): "PCO2", "PO2"  Studies/Results: CT ABDOMEN PELVIS W CONTRAST  Addendum Date: 07/26/2023   ADDENDUM REPORT: 07/26/2023 17:29 ADDENDUM: Critical Value/emergent results were called by telephone at the time of interpretation on 07/26/2023 at 5:29 pm to Dr. Beckey Downing, Who verbally acknowledged these results. Electronically Signed   By: Jules Schick M.D.   On: 07/26/2023 17:29   Result Date: 07/26/2023 CLINICAL DATA:  Abdominal pain, acute, nonlocalized. EXAM: CT ABDOMEN AND PELVIS WITH CONTRAST TECHNIQUE: Multidetector CT imaging of the abdomen and pelvis was performed using the standard protocol following bolus administration of intravenous contrast. RADIATION DOSE REDUCTION: This exam was performed according to the departmental dose-optimization program which includes automated exposure control,  adjustment of the mA and/or kV according to patient size and/or use of iterative reconstruction technique. CONTRAST:  OMNIPAQUE IOHEXOL 300 MG/ML  SOLN COMPARISON:  CT scan abdomen and pelvis from 04/29/2004. FINDINGS: Lower chest: There are dependent changes in the visualized lung bases. No overt consolidation. No pleural effusion. The heart is normal in size. No pericardial effusion. Hepatobiliary: The liver is normal in size. Non-cirrhotic configuration. No suspicious mass. These is mild diffuse hepatic steatosis. No intrahepatic or extrahepatic bile duct dilation. No calcified gallstones. Normal gallbladder wall thickness. No pericholecystic inflammatory changes. Pancreas: Unremarkable. No pancreatic ductal dilatation or surrounding inflammatory changes. Spleen: Within normal limits. No focal lesion. Adrenals/Urinary Tract: Adrenal glands are unremarkable. No suspicious renal mass. No hydronephrosis. No renal or ureteric calculi. Unremarkable urinary bladder. Stomach/Bowel: There is closed loop obstruction of the several loops of distal small bowel with 2 points of transition in the right lower quadrant (series 2, images 48 and 57). The bowel loops are dilated measuring up to 3.6 cm in diameter. There is significant perienteric fat stranding/fluid and prominence of vasa recta. Wall of 1 of the bowel loop (series 7, image 35) does not enhance similar to the other dilated bowel loops and highly concerning for ischemia. There is no pneumoperitoneum, pneumatosis or portal venous gas. Vascular/Lymphatic: There is small amount of inter bowel free fluid as well as free fluid in the dependent pelvis and left paracolic gutter, likely reactive. No abdominal or pelvic lymphadenopathy, by size criteria. No aneurysmal dilation of the major abdominal arteries. There are moderate peripheral atherosclerotic vascular calcifications of the  aorta and its major branches. Reproductive: The uterus is surgically absent. No large  adnexal mass. Other: There is a tiny fat containing umbilical hernia. The soft tissues and abdominal wall are otherwise unremarkable. Musculoskeletal: No suspicious osseous lesions. There are moderate multilevel degenerative changes in the visualized spine. IMPRESSION: 1. Findings favor closed loop obstruction of several loops of distal small bowel, as described above, which may be due to underlying adhesions. 2. One of the dilated/obstructed bowel loop wall is not enhancing similar to the other bowel loops and highly concerning for underlying bowel wall ischemia. 3. Small reactive ascites. No pneumoperitoneum, pneumatosis or portal venous gas. 4. Multiple other nonacute observations, as described above. Electronically Signed: By: Jules Schick M.D. On: 07/26/2023 17:25    Anti-infectives: Anti-infectives (From admission, onward)    Start     Dose/Rate Route Frequency Ordered Stop   07/26/23 1745  piperacillin-tazobactam (ZOSYN) IVPB 3.375 g        3.375 g 12.5 mL/hr over 240 Minutes Intravenous  Once 07/26/23 1737 07/26/23 1931       Assessment/Plan: Closed loop SBO with ischemic small bowel and perforation  POD2 s/p laparoscopy converted to open small bowel resection   Increased WBC likely reactionary.  Will repeat in the morning  Continue NPO and NG Monitor UOP Continue antibiotics Dressing changes    LOS: 2 days    Abigail Miyamoto MD 07/28/2023

## 2023-07-28 NOTE — Progress Notes (Signed)
       Overnight   NAME: Laura Bradshaw MRN: [id number] DOB : [birth year]    Date of Service   07/28/2023   HPI/Events of Note    Notified by RN for agitation, possible pain. Patient and family stated increased possible and severe agitation with Ativan. Also agitation, hallucinations with Dilaudid earlier today.  Patient has had morphine previously with known difficulty seen. Robaxin appears to with help at about 17 minutes.   Interventions/ Plan   Will use once morphine dose. Will avoid Ativan and Dilaudid as possible Continue attending's previous orders      Chinita Greenland BSN MSNA MSN ACNPC-AG Acute Care Nurse Practitioner [facility]

## 2023-07-28 NOTE — Plan of Care (Signed)
  Problem: Education: Goal: Knowledge of General Education information will improve Description: Including pain rating scale, medication(s)/side effects and non-pharmacologic comfort measures Outcome: Progressing   Problem: Clinical Measurements: Goal: Ability to maintain clinical measurements within normal limits will improve Outcome: Progressing   Problem: Activity: Goal: Risk for activity intolerance will decrease Outcome: Progressing   Problem: Nutrition: Goal: Adequate nutrition will be maintained Outcome: Progressing   Problem: Coping: Goal: Level of anxiety will decrease Outcome: Progressing   Problem: Pain Management: Goal: General experience of comfort will improve Outcome: Progressing   Problem: Safety: Goal: Ability to remain free from injury will improve Outcome: Progressing

## 2023-07-28 NOTE — Plan of Care (Signed)
The patient has had intermittent confusion and disorientation throughout the shift. She has had minimal complaints of pain throughout the shift, but she has refused dilaudid due to feeling that it worsens her confusion. She reports adequate relief with the scheduled IV tylenol. VSS. Fall precautions in place. Will continue to monitor.  Problem: Education: Goal: Knowledge of General Education information will improve Description: Including pain rating scale, medication(s)/side effects and non-pharmacologic comfort measures Outcome: Progressing   Problem: Health Behavior/Discharge Planning: Goal: Ability to manage health-related needs will improve Outcome: Progressing   Problem: Clinical Measurements: Goal: Ability to maintain clinical measurements within normal limits will improve Outcome: Progressing Goal: Will remain free from infection Outcome: Progressing Goal: Diagnostic test results will improve Outcome: Progressing Goal: Respiratory complications will improve Outcome: Progressing Goal: Cardiovascular complication will be avoided Outcome: Progressing   Problem: Activity: Goal: Risk for activity intolerance will decrease Outcome: Progressing   Problem: Nutrition: Goal: Adequate nutrition will be maintained Outcome: Progressing   Problem: Coping: Goal: Level of anxiety will decrease Outcome: Progressing   Problem: Elimination: Goal: Will not experience complications related to bowel motility Outcome: Progressing Goal: Will not experience complications related to urinary retention Outcome: Progressing   Problem: Pain Management: Goal: General experience of comfort will improve Outcome: Progressing   Problem: Safety: Goal: Ability to remain free from injury will improve Outcome: Progressing   Problem: Skin Integrity: Goal: Risk for impaired skin integrity will decrease Outcome: Progressing

## 2023-07-29 DIAGNOSIS — K56609 Unspecified intestinal obstruction, unspecified as to partial versus complete obstruction: Secondary | ICD-10-CM | POA: Diagnosis not present

## 2023-07-29 LAB — COMPREHENSIVE METABOLIC PANEL
ALT: 18 U/L (ref 0–44)
AST: 16 U/L (ref 15–41)
Albumin: 2.6 g/dL — ABNORMAL LOW (ref 3.5–5.0)
Alkaline Phosphatase: 41 U/L (ref 38–126)
Anion gap: 9 (ref 5–15)
BUN: 18 mg/dL (ref 8–23)
CO2: 23 mmol/L (ref 22–32)
Calcium: 9.8 mg/dL (ref 8.9–10.3)
Chloride: 109 mmol/L (ref 98–111)
Creatinine, Ser: 0.71 mg/dL (ref 0.44–1.00)
GFR, Estimated: >60 mL/min (ref >60)
Glucose, Bld: 120 mg/dL — ABNORMAL HIGH (ref 70–99)
Potassium: 3.7 mmol/L (ref 3.5–5.1)
Sodium: 141 mmol/L (ref 135–145)
Total Bilirubin: 0.7 mg/dL (ref <1.2)
Total Protein: 5.6 g/dL — ABNORMAL LOW (ref 6.5–8.1)

## 2023-07-29 LAB — CBC
HCT: 33.3 % — ABNORMAL LOW (ref 36.0–46.0)
Hemoglobin: 10.5 g/dL — ABNORMAL LOW (ref 12.0–15.0)
MCH: 31.3 pg (ref 26.0–34.0)
MCHC: 31.5 g/dL (ref 30.0–36.0)
MCV: 99.4 fL (ref 80.0–100.0)
Platelets: 283 10*3/uL (ref 150–400)
RBC: 3.35 MIL/uL — ABNORMAL LOW (ref 3.87–5.11)
RDW: 13.9 % (ref 11.5–15.5)
WBC: 15.4 10*3/uL — ABNORMAL HIGH (ref 4.0–10.5)
nRBC: 0 % (ref 0.0–0.2)

## 2023-07-29 MED ORDER — METHOCARBAMOL 1000 MG/10ML IJ SOLN
500.0000 mg | Freq: Once | INTRAMUSCULAR | Status: AC
  Administered 2023-07-29: 500 mg via INTRAVENOUS

## 2023-07-29 MED ORDER — MOMETASONE FURO-FORMOTEROL FUM 200-5 MCG/ACT IN AERO
2.0000 | INHALATION_SPRAY | Freq: Two times a day (BID) | RESPIRATORY_TRACT | Status: DC
  Administered 2023-07-30 – 2023-08-12 (×27): 2 via RESPIRATORY_TRACT
  Filled 2023-07-29: qty 8.8

## 2023-07-29 MED ORDER — MONTELUKAST SODIUM 10 MG PO TABS
10.0000 mg | ORAL_TABLET | Freq: Every day | ORAL | Status: DC
  Administered 2023-07-29 – 2023-08-11 (×6): 10 mg via ORAL
  Filled 2023-07-29 (×8): qty 1

## 2023-07-29 MED ORDER — ALPRAZOLAM 0.5 MG PO TABS
0.5000 mg | ORAL_TABLET | Freq: Two times a day (BID) | ORAL | Status: DC | PRN
  Administered 2023-07-29 – 2023-08-01 (×2): 0.5 mg via ORAL
  Filled 2023-07-29 (×2): qty 1

## 2023-07-29 MED ORDER — ESCITALOPRAM OXALATE 20 MG PO TABS
20.0000 mg | ORAL_TABLET | Freq: Every day | ORAL | Status: DC
Start: 2023-07-29 — End: 2023-08-12
  Administered 2023-07-29 – 2023-08-12 (×9): 20 mg via ORAL
  Filled 2023-07-29 (×11): qty 1

## 2023-07-29 MED ORDER — BOOST / RESOURCE BREEZE PO LIQD CUSTOM
1.0000 | Freq: Three times a day (TID) | ORAL | Status: DC
  Administered 2023-07-29 (×3): 1 via ORAL

## 2023-07-29 MED ORDER — GABAPENTIN 300 MG PO CAPS
300.0000 mg | ORAL_CAPSULE | Freq: Three times a day (TID) | ORAL | Status: DC
  Administered 2023-07-29 – 2023-08-02 (×4): 300 mg via ORAL
  Filled 2023-07-29 (×8): qty 1

## 2023-07-29 MED ORDER — LISINOPRIL 10 MG PO TABS
10.0000 mg | ORAL_TABLET | Freq: Every day | ORAL | Status: DC
  Administered 2023-07-29 – 2023-08-11 (×6): 10 mg via ORAL
  Filled 2023-07-29 (×8): qty 1

## 2023-07-29 MED ORDER — ACETAMINOPHEN 500 MG PO TABS
1000.0000 mg | ORAL_TABLET | ORAL | Status: DC | PRN
  Administered 2023-07-29: 1000 mg via ORAL
  Filled 2023-07-29: qty 2

## 2023-07-29 MED ORDER — SIMVASTATIN 40 MG PO TABS
40.0000 mg | ORAL_TABLET | Freq: Every day | ORAL | Status: DC
  Administered 2023-07-29 – 2023-08-12 (×9): 40 mg via ORAL
  Filled 2023-07-29 (×11): qty 1

## 2023-07-29 MED ORDER — GUAIFENESIN-DM 100-10 MG/5ML PO SYRP: 5.0000 mL | ORAL_SOLUTION | ORAL | Status: DC | PRN

## 2023-07-29 NOTE — Progress Notes (Signed)
Pt agitated on arrival of shift. Day RN administered Ativan which made pt more agitated and anxious. Refuses to take dilaudid as had negative side effects. Pt c/o pain and anxiety. Notified ON call NP. See PRN orders. Morphine helped with pain, robaxin helps calm pt however still unable to sleep.

## 2023-07-29 NOTE — Progress Notes (Signed)
[facility] PROGRESS NOTE  Laura Bradshaw (DOB: 1951/06/26) ZOX:[id number] PCP: Alberteen Sam, FNP  Brief Narrative: Laura Bradshaw is a 72 y.o. female with PMH of HTN, anxiety, depression, COPD, HSV, low back pain and mild dementia presenting with acute abdominal pain and vomiting. She was found to have closed-loop bowel obstruction and ischemia, taken to the OR for laparoscopic, converted to open, small bowel resection with intraoperative findings of perforated closed loop obstruction with ischemic jejunum.  Subjective: Feels fine, pain controlled, passing flatus, happy to have NG out, family at bedside.   Objective: BP (!) 155/84 (BP Location: Right Arm)   Pulse 96   Temp 98.5 F (36.9 C) (Oral)   Resp 18   Ht 5\' 1"  (1.549 m)   Wt 67.6 kg   SpO2 97%   BMI 28.15 kg/m   Gen: No distress, she looks much brighter and better.  Pulm: Clear posteriorly, having some upper airway sounds. Nonlabored without oxygen. CV: RRR, no MRG or edema GI: Soft, minimally tender, nondistended, +BS Neuro: HOH, but alert and interactive, pleasant. No focal deficits noted.  Assessment & Plan: Small bowel obstruction, perforation, jejunal ischemia: s/p resection 12/5 by Dr. Maisie Fus.  - NG out 12/8, CLD per surgery - Can DC IVF    - Surg path pending. - Pain control as ordered.   HTN: - Stable, restart lisinopril.    Anxiety and depression: Stable - Resume home meds    COPD:  - Restart controller med and prn albuterol neb. No lower wheezing to suggest exacerbation at this time.  - Encourage IS (at bedside)   Mild hypercalcemia: Likely due to dehydration, resolved.   Leukocytosis: Resolved, then recurrent postoperatively.  - Restarted zosyn pending cultures, no operative culture sent. - Blood cultures are NGTD   Hyperglycemia, prediabetes: HbA1c 6.1%.  - Outpatient follow up recommended.   Tyrone Nine, MD [facility] [email or web address] 07/29/2023, 3:01 PM

## 2023-07-29 NOTE — TOC Initial Note (Addendum)
Transition of Care [facility]) - Initial/Assessment Note    Patient Details  Name: Laura Bradshaw MRN: [id number] Date of Birth: October 23, 1950  [facility]) CM/SW Contact:    Adrian Prows, RN Phone Number: 07/29/2023, 4:29 PM  Clinical Narrative:                 TOC for d/c planning; PT recc HHPT/OT;  spoke w/ pt and dtr Glory Buff [phone]) in room; pt says she lives at home alone; she will d/c to her dtr's home Eloise Asher [phone]); pt verified she has insurance/PCP; she has transportation; she denies SDOH risks; pt has glasses and upper denture; pt does not have DME, HH services, or DME;  they agree to receive recc services; [facility] is their agency of choice; d/c address confirmed: [address] [place], [address]; LVM for Lurline Del at [facility] for notification; awaiting response,; also awaiting orders.  -[id number]- notified by Herbert Seta at [facility]; unable to accept d/t staffing; will pass on to oncoming TOC for set up  Expected Discharge Plan: Home w Home Health Services Barriers to Discharge: Continued Medical Work up   Patient Goals and CMS Choice Patient states their goals for this hospitalization and ongoing recovery are:: home CMS Medicare.gov Compare Post Acute Care list provided to:: Patient        Expected Discharge Plan and Services   Discharge Planning Services: CM Consult Post Acute Care Choice: Home Health Living arrangements for the past 2 months: Single Family Home                                      Prior Living Arrangements/Services Living arrangements for the past 2 months: Single Family Home Lives with:: Self Patient language and need for interpreter reviewed:: Yes Do you feel safe going back to the place where you live?: Yes      Need for Family Participation in Patient Care: Yes (Comment) Care giver support system in place?: Yes (comment) Current home services:  (n/a) Criminal Activity/Legal Involvement  Pertinent to Current Situation/Hospitalization: No - Comment as needed  Activities of Daily Living   ADL Screening (condition at time of admission) Independently performs ADLs?: Yes (appropriate for developmental age) Is the patient deaf or have difficulty hearing?: No Does the patient have difficulty seeing, even when wearing glasses/contacts?: No Does the patient have difficulty concentrating, remembering, or making decisions?: No  Permission Sought/Granted Permission sought to share information with : Case Manager Permission granted to share information with : Yes, Verbal Permission Granted  Share Information with NAME: Case Manager        Permission granted to share info w Contact Information: Elexia London (dtr) [phone] / Glory Buff (dtr) [phone]  Emotional Assessment Appearance:: Appears stated age Attitude/Demeanor/Rapport: Gracious Affect (typically observed): Accepting Orientation: : Oriented to Self, Oriented to Place, Oriented to  Time, Oriented to Situation Alcohol / Substance Use: Not Applicable Psych Involvement: No (comment)  Admission diagnosis:  Small bowel obstruction (HCC) [K56.609] Generalized abdominal pain [R10.84] Complete intestinal obstruction, unspecified cause (HCC) [K56.601] Patient Active Problem List   Diagnosis Date Noted   Small bowel obstruction (HCC) 07/26/2023   HYPERLIPIDEMIA 04/04/2007   ANXIETY 04/04/2007   DEPRESSION 04/04/2007   HYPERTENSION 04/04/2007   DEGENERATIVE DISC DISEASE 04/04/2007   LOW BACK PAIN, CHRONIC 04/04/2007   PCP:  Alberteen Sam, FNP Pharmacy:   [facility] [address] [place] [address] Phone: [phone] Fax: [phone]  [id number] - [place], [place] - [address] [place] [address] Phone: [phone] Fax: [phone]     Social Determinants of Health (SDOH) Social History: SDOH Screenings   Food  Insecurity: No Food Insecurity (07/29/2023)  Housing: Low Risk  (07/29/2023)  Transportation Needs: No Transportation Needs (07/29/2023)  Utilities: Not At Risk (07/27/2023)  Financial Resource Strain: Low Risk  (06/28/2023)   Received from [facility]  Physical Activity: Unknown (06/28/2023)   Received from [facility]  Social Connections: Somewhat Isolated (06/28/2023)   Received from [facility]  Stress: Stress Concern Present (06/28/2023)   Received from [facility]  Tobacco Use: High Risk (07/26/2023)   SDOH Interventions: Food Insecurity Interventions: Intervention Not Indicated, Inpatient TOC Housing Interventions: Intervention Not Indicated, Inpatient TOC Transportation Interventions: Intervention Not Indicated, Inpatient TOC Utilities Interventions: Inpatient TOC   Readmission Risk Interventions     No data to display

## 2023-07-29 NOTE — Progress Notes (Signed)
OT Cancellation Note  Patient Details Name: Laura Bradshaw MRN: [id number] DOB: 1950-12-04   Cancelled Treatment:    Reason Eval/Treat Not Completed: Fatigue/lethargy limiting ability to participate Patient was checked on before lunch with patient having just participated in PT. Patient agreed upon time this afternoon. When approached at time patient was in bed resting with family reporting she was too fatigued to wait. OT to continue to follow and check back as schedule will allow.  Rosalio Loud, MS Acute Rehabilitation Department Office# [phone]  07/29/2023, 1:15 PM

## 2023-07-29 NOTE — Progress Notes (Signed)
Physical Therapy Treatment Patient Details Name: Laura Bradshaw MRN: [id number] DOB: 09-06-1950 Today's Date: 07/29/2023   History of Present Illness Pt is 72 yo female admitted on 07/26/23 with SBO, perforation, jejunal ischemia and is s/p open small bowel resection on 07/26/23.  Midline wound open - possible wound vac placement Monday, 12/9.  Pt with hx including but not limited to HTN, anxiety, depression, COPD, HSV, low back pain, mild dementia.    PT Comments  Pt visiting with multiple family members but agreeable to mobilize. Pt able to ambulate 120 ft in hallway with RW today!     If plan is discharge home, recommend the following: A little help with walking and/or transfers;A little help with bathing/dressing/bathroom;Help with stairs or ramp for entrance;Assistance with cooking/housework   Can travel by private Scientist, research (medical) walker (2 wheels)    Recommendations for Other Services       Precautions / Restrictions Precautions Precautions: Fall Precaution Comments: open abdominal incision     Mobility  Bed Mobility               General bed mobility comments: pt in recliner    Transfers Overall transfer level: Needs assistance Equipment used: Rolling walker (2 wheels) Transfers: Sit to/from Stand Sit to Stand: Min assist           General transfer comment: cues for hand placement, light assist to rise and steady    Ambulation/Gait Ambulation/Gait assistance: Contact guard assist Gait Distance (Feet): 120 Feet Assistive device: Rolling walker (2 wheels) Gait Pattern/deviations: Decreased stride length, Step-through pattern Gait velocity: decreased     General Gait Details: cues for use of RW positioning and posture, distance to tolerance; pt reports minimal pain but tolerable   Stairs             Wheelchair Mobility     Tilt Bed    Modified Rankin (Stroke Patients Only)       Balance                                             Cognition Arousal: Alert Behavior During Therapy: WFL for tasks assessed/performed Overall Cognitive Status: Within Functional Limits for tasks assessed                                 General Comments: multiple family members present        Exercises      General Comments        Pertinent Vitals/Pain Pain Assessment Pain Assessment: 0-10 Pain Score: 5  Pain Location: abdomen Pain Descriptors / Indicators: Discomfort, Sore Pain Intervention(s): Repositioned, Monitored during session    Home Living                          Prior Function            PT Goals (current goals can now be found in the care plan section) Progress towards PT goals: Progressing toward goals    Frequency    Min 1X/week      PT Plan      Co-evaluation              AM-PAC PT "6 Clicks" Mobility   Outcome  Measure  Help needed turning from your back to your side while in a flat bed without using bedrails?: A Little Help needed moving from lying on your back to sitting on the side of a flat bed without using bedrails?: A Little Help needed moving to and from a bed to a chair (including a wheelchair)?: A Little Help needed standing up from a chair using your arms (e.g., wheelchair or bedside chair)?: A Little Help needed to walk in hospital room?: A Little Help needed climbing 3-5 steps with a railing? : A Lot 6 Click Score: 17    End of Session Equipment Utilized During Treatment: Gait belt Activity Tolerance: Patient tolerated treatment well Patient left: with call bell/phone within reach;with family/visitor present;in chair   PT Visit Diagnosis: Difficulty in walking, not elsewhere classified (R26.2)     Time: 5284-1324 PT Time Calculation (min) (ACUTE ONLY): 9 min  Charges:    $Gait Training: 8-22 mins PT General Charges $$ ACUTE PT VISIT: 1 Visit                     Paulino Door, DPT Physical  Therapist Acute Rehabilitation Services Office: [phone]    [name] 07/29/2023, 4:47 PM

## 2023-07-29 NOTE — Plan of Care (Signed)
The patient has rested comfortably throughout the shift. She has had minimal complaints of pain, but has refused pain medication. The foley was removed this morning, and she has voided multiple times throughout the shift without difficulty. Her NGT was also removed, and she was started on a clear liquid diet. She is tolerating the diet well without pain, nausea, or vomiting. She was weaned from the supplemental oxygen. She was noted to have some wheezing and was given a neb treatment and an incentive breathing spirometer. She ambulated with PT, and she spent the majority of the shift in the bedside chair. Her mentation was much improved from yesterday, but has started to get confused in the later hours of the shift. VSS. Fall precautions in place. Will continue to monitor.  Problem: Education: Goal: Knowledge of General Education information will improve Description: Including pain rating scale, medication(s)/side effects and non-pharmacologic comfort measures Outcome: Progressing   Problem: Health Behavior/Discharge Planning: Goal: Ability to manage health-related needs will improve Outcome: Progressing   Problem: Clinical Measurements: Goal: Ability to maintain clinical measurements within normal limits will improve Outcome: Progressing Goal: Will remain free from infection Outcome: Progressing Goal: Diagnostic test results will improve Outcome: Progressing Goal: Respiratory complications will improve Outcome: Progressing Goal: Cardiovascular complication will be avoided Outcome: Progressing   Problem: Activity: Goal: Risk for activity intolerance will decrease Outcome: Progressing   Problem: Nutrition: Goal: Adequate nutrition will be maintained Outcome: Progressing   Problem: Coping: Goal: Level of anxiety will decrease Outcome: Progressing   Problem: Elimination: Goal: Will not experience complications related to bowel motility Outcome: Progressing Goal: Will not experience  complications related to urinary retention Outcome: Progressing   Problem: Pain Management: Goal: General experience of comfort will improve Outcome: Progressing   Problem: Safety: Goal: Ability to remain free from injury will improve Outcome: Progressing   Problem: Skin Integrity: Goal: Risk for impaired skin integrity will decrease Outcome: Progressing

## 2023-07-29 NOTE — Progress Notes (Signed)
3 Days Post-Op   Subjective/Chief Complaint: More alert this morning Family at bedside NG with minimal output Reports passing flatus   Objective: Vital signs in last 24 hours: Temp:  [98.1 F (36.7 C)-99 F (37.2 C)] 98.5 F (36.9 C) (12/08 0534) Pulse Rate:  [96-104] 96 (12/08 0534) Resp:  [18] 18 (12/08 0534) BP: (116-155)/(59-84) 155/84 (12/08 0534) SpO2:  [94 %-100 %] 97 % (12/08 0534) Last BM Date : 07/25/23  Intake/Output from previous day: 12/07 0701 - 12/08 0700 In: 2133 [I.V.:1822.8; IV Piggyback:310.2] Out: 1850 [Urine:1650; Emesis/NG output:200] Intake/Output this shift: No intake/output data recorded.  Exam: Awake and alert, following commands Abdomen soft, non-distended, minimally tender Midline wound clean  Lab Results:  Recent Labs    07/27/23 0253 07/28/23 0523  WBC 3.6* 15.4*  HGB 14.0 11.4*  HCT 42.6 36.1  PLT 363 296   BMET Recent Labs    07/27/23 0253 07/28/23 0523  NA 135 136  K 3.4* 4.2  CL 106 106  CO2 20* 23  GLUCOSE 135* 151*  BUN 17 23  CREATININE 0.81 0.76  CALCIUM 8.7* 8.8*   PT/INR No results for input(s): "LABPROT", "INR" in the last 72 hours. ABG No results for input(s): "PHART", "HCO3" in the last 72 hours.  Invalid input(s): "PCO2", "PO2"  Studies/Results: No results found.  Anti-infectives: Anti-infectives (From admission, onward)    Start     Dose/Rate Route Frequency Ordered Stop   07/28/23 1330  piperacillin-tazobactam (ZOSYN) IVPB 3.375 g        3.375 g 12.5 mL/hr over 240 Minutes Intravenous Every 8 hours 07/28/23 1232     07/26/23 1745  piperacillin-tazobactam (ZOSYN) IVPB 3.375 g        3.375 g 12.5 mL/hr over 240 Minutes Intravenous  Once 07/26/23 1737 07/26/23 1931       Assessment/Plan: Closed loop SBO with ischemic small bowel and perforation  POD 3 s/p laparoscopy converted to open small bowel resection   -D/C NG -start clear liquids -out of bed -continue wound care, possible wound  [facility] tomorrow -Labs pending Abigail Miyamoto 07/29/2023

## 2023-07-30 ENCOUNTER — Inpatient Hospital Stay (HOSPITAL_COMMUNITY): Payer: Medicare PPO

## 2023-07-30 DIAGNOSIS — K56609 Unspecified intestinal obstruction, unspecified as to partial versus complete obstruction: Secondary | ICD-10-CM | POA: Diagnosis not present

## 2023-07-30 LAB — BASIC METABOLIC PANEL
Anion gap: 11 (ref 5–15)
BUN: 15 mg/dL (ref 8–23)
CO2: 24 mmol/L (ref 22–32)
Calcium: 10.5 mg/dL — ABNORMAL HIGH (ref 8.9–10.3)
Chloride: 103 mmol/L (ref 98–111)
Creatinine, Ser: 0.61 mg/dL (ref 0.44–1.00)
GFR, Estimated: >60 mL/min (ref >60)
Glucose, Bld: 137 mg/dL — ABNORMAL HIGH (ref 70–99)
Potassium: 2.6 mmol/L — CL (ref 3.5–5.1)
Sodium: 138 mmol/L (ref 135–145)

## 2023-07-30 LAB — CBC
HCT: 34.6 % — ABNORMAL LOW (ref 36.0–46.0)
Hemoglobin: 11.2 g/dL — ABNORMAL LOW (ref 12.0–15.0)
MCH: 31.5 pg (ref 26.0–34.0)
MCHC: 32.4 g/dL (ref 30.0–36.0)
MCV: 97.2 fL (ref 80.0–100.0)
Platelets: 305 10*3/uL (ref 150–400)
RBC: 3.56 MIL/uL — ABNORMAL LOW (ref 3.87–5.11)
RDW: 14 % (ref 11.5–15.5)
WBC: 17.2 10*3/uL — ABNORMAL HIGH (ref 4.0–10.5)
nRBC: 0 % (ref 0.0–0.2)

## 2023-07-30 LAB — SURGICAL PATHOLOGY

## 2023-07-30 MED ORDER — POTASSIUM CHLORIDE 10 MEQ/100ML IV SOLN
10.0000 meq | INTRAVENOUS | Status: AC
  Administered 2023-07-30 (×6): 10 meq via INTRAVENOUS
  Filled 2023-07-30 (×6): qty 100

## 2023-07-30 MED ORDER — OXYCODONE HCL 5 MG PO TABS: 2.5000 mg | ORAL_TABLET | Freq: Four times a day (QID) | ORAL | Status: DC | PRN

## 2023-07-30 MED ORDER — ACETAMINOPHEN 10 MG/ML IV SOLN
1000.0000 mg | Freq: Once | INTRAVENOUS | Status: AC
  Administered 2023-07-30: 1000 mg via INTRAVENOUS
  Filled 2023-07-30: qty 100

## 2023-07-30 MED ORDER — KCL IN DEXTROSE-NACL 40-5-0.45 MEQ/L-%-% IV SOLN
INTRAVENOUS | Status: AC
  Filled 2023-07-30 (×2): qty 1000

## 2023-07-30 NOTE — TOC Progression Note (Signed)
Transition of Care [facility]) - Progression Note    Patient Details  Name: Laura Bradshaw MRN: [id number] Date of Birth: [birth year]  Transition of Care [facility]) CM/SW Contact  Otelia Santee, LCSW Phone Number: 07/30/2023, 1:57 PM  Clinical Narrative:    Pt planned to return home with wound vac. Wound vac ordered through [employer]. Order form signed by PA and returned to [employer] w/ [employer]. HHPT/OT/RN has been arranged with [facility]. HH orders will need to be placed prior to discharge. RW will need to be ordered for pt closer to DC.    Expected Discharge Plan: Home w Home Health Services Barriers to Discharge: Continued Medical Work up  Expected Discharge Plan and Services   Discharge Planning Services: CM Consult Post Acute Care Choice: Home Health Living arrangements for the past 2 months: Single Family Home                 DME Arranged: Vac DME Agency: [employer] Date DME Agency Contacted: 07/30/23 Time DME Agency Contacted: 1356 Representative spoke with at DME Agency: [facility] HH Arranged: PT, OT, RN HH Agency: [facility] Date [facility] Agency Contacted: 07/30/23 Time HH Agency Contacted: 1356 Representative spoke with at [facility] Agency: Cindie   Social Determinants of Health (SDOH) Interventions SDOH Screenings   Food Insecurity: No Food Insecurity (07/29/2023)  Housing: Low Risk  (07/29/2023)  Transportation Needs: No Transportation Needs (07/29/2023)  Utilities: Not At Risk (07/27/2023)  Financial Resource Strain: Low Risk  (06/28/2023)   Received from [facility]  Physical Activity: Unknown (06/28/2023)   Received from [facility]  Social Connections: Somewhat Isolated (06/28/2023)   Received from [facility]  Stress: Stress Concern Present (06/28/2023)   Received from [facility]  Tobacco Use: High Risk (07/26/2023)    Readmission Risk Interventions     No data to display

## 2023-07-30 NOTE — Progress Notes (Addendum)
OT Cancellation Note  Patient Details Name: Laura Bradshaw MRN: [id number] DOB: June 23, 1951   Cancelled Treatment:    Reason Eval/Treat Not Completed: Medical issues which prohibited therapy Patient with potassium of 2.6 this AM with patient in bed not feeling well with family in room. OT to hold and check back later this afternoon.  Addendum 14:03 pm patients nurse asking for therapy to hold off with recent replacement of NG tube.   Rosalio Loud, MS Acute Rehabilitation Department Office# [phone]  07/30/2023, 9:37 AM

## 2023-07-30 NOTE — Plan of Care (Signed)
The patient had multiple episodes of nausea and vomiting this shift, increased abdominal distention, and she developed pain with abdominal palpation. Per the provider a NGT was reinserted and placed on LIWS. This has given her complete relief from the N/V and almost complete relief of her pain. A wound vac was also placed on her abdominal wound, and she tolerated it well and without difficulty. This has maintained a good seal throughout the shift. VSS. Fall precautions in place. Will continue to monitor.  Problem: Education: Goal: Knowledge of General Education information will improve Description: Including pain rating scale, medication(s)/side effects and non-pharmacologic comfort measures Outcome: Progressing   Problem: Health Behavior/Discharge Planning: Goal: Ability to manage health-related needs will improve Outcome: Progressing   Problem: Clinical Measurements: Goal: Ability to maintain clinical measurements within normal limits will improve Outcome: Progressing Goal: Will remain free from infection Outcome: Progressing Goal: Diagnostic test results will improve Outcome: Progressing Goal: Respiratory complications will improve Outcome: Progressing Goal: Cardiovascular complication will be avoided Outcome: Progressing   Problem: Activity: Goal: Risk for activity intolerance will decrease Outcome: Progressing   Problem: Nutrition: Goal: Adequate nutrition will be maintained Outcome: Progressing   Problem: Coping: Goal: Level of anxiety will decrease Outcome: Progressing   Problem: Elimination: Goal: Will not experience complications related to bowel motility Outcome: Progressing Goal: Will not experience complications related to urinary retention Outcome: Progressing   Problem: Pain Management: Goal: General experience of comfort will improve Outcome: Progressing   Problem: Safety: Goal: Ability to remain free from injury will improve Outcome: Progressing    Problem: Skin Integrity: Goal: Risk for impaired skin integrity will decrease Outcome: Progressing

## 2023-07-30 NOTE — Progress Notes (Signed)
Progress Note  4 Days Post-Op  Subjective: Pt with some delirium agitation through the weekend although her daughter feels like that is better so far this AM. Passing some flatus but also having some n/v. Abdomen is more sore than painful. Up and mobilizing quite a bit yesterday.   Objective: Vital signs in last 24 hours: Temp:  [98.1 F (36.7 C)-100.5 F (38.1 C)] 98.1 F (36.7 C) (12/09 0339) Pulse Rate:  [96-109] 96 (12/09 0339) Resp:  [15-16] 16 (12/09 0339) BP: (149-169)/(72-76) 149/72 (12/09 0339) SpO2:  [90 %-96 %] 91 % (12/09 0830) Last BM Date : 07/25/23  Intake/Output from previous day: 12/08 0701 - 12/09 0700 In: -  Out: 800 [Urine:800] Intake/Output this shift: No intake/output data recorded.  PE: General: pleasant, WD, WN female who is laying in bed in NAD Heart: regular, rate, and rhythm.  Lungs: Respiratory effort nonlabored Abd: soft, appropriately ttp, midline wound clean, mild distention, BS hypoactive    Lab Results:  Recent Labs    07/29/23 0750 07/30/23 0500  WBC 15.4* 17.2*  HGB 10.5* 11.2*  HCT 33.3* 34.6*  PLT 283 305   BMET Recent Labs    07/29/23 0750 07/30/23 0500  NA 141 138  K 3.7 2.6*  CL 109 103  CO2 23 24  GLUCOSE 120* 137*  BUN 18 15  CREATININE 0.71 0.61  CALCIUM 9.8 10.5*   PT/INR No results for input(s): "LABPROT", "INR" in the last 72 hours. CMP     Component Value Date/Time   NA 138 07/30/2023 0500   K 2.6 (LL) 07/30/2023 0500   CL 103 07/30/2023 0500   CO2 24 07/30/2023 0500   GLUCOSE 137 (H) 07/30/2023 0500   BUN 15 07/30/2023 0500   CREATININE 0.61 07/30/2023 0500   CALCIUM 10.5 (H) 07/30/2023 0500   PROT 5.6 (L) 07/29/2023 0750   ALBUMIN 2.6 (L) 07/29/2023 0750   AST 16 07/29/2023 0750   ALT 18 07/29/2023 0750   ALKPHOS 41 07/29/2023 0750   BILITOT 0.7 07/29/2023 0750   GFRNONAA >60 07/30/2023 0500   GFRAA >60 01/06/2018 1315   Lipase     Component Value Date/Time   LIPASE 25 07/26/2023 1358        Studies/Results: No results found.  Anti-infectives: Anti-infectives (From admission, onward)    Start     Dose/Rate Route Frequency Ordered Stop   07/28/23 1330  piperacillin-tazobactam (ZOSYN) IVPB 3.375 g        3.375 g 12.5 mL/hr over 240 Minutes Intravenous Every 8 hours 07/28/23 1232     07/26/23 1745  piperacillin-tazobactam (ZOSYN) IVPB 3.375 g        3.375 g 12.5 mL/hr over 240 Minutes Intravenous  Once 07/26/23 1737 07/26/23 1931        Assessment/Plan  Closed loop SBO with ischemic small bowel and perforation  POD 4 s/p laparoscopy converted to open small bowel resection Dr. Maisie Fus - low grade temp and WBC 17 from 15, HD stable - would prefer to wait another day or 2 to scan if needed as I would not expect anything organized to drain only 4 days out from surgery  - some n/v since NGT removal on CLD - check KUB this AM - back to NPO this AM - Will place VAC to midline wound today and get home vac and HH RN ordered  - continue to mobilize as tolerated - Keep K >4.0 and Mg >2.0   FEN: NPO, IVF, K replacement per primary  VTE: LMWH ID: Zosyn 12/5>>  LOS: 4 days     Juliet Rude, [facility] Surgery 07/30/2023, 10:34 AM Please see Amion for pager number during day hours 7:00am-4:30pm

## 2023-07-30 NOTE — Anesthesia Postprocedure Evaluation (Signed)
Anesthesia Post Note  Patient: Laura Bradshaw  Procedure(s) Performed: LAPAROSCOPY DIAGNOSTIC CONVERTED TO OPEN BOWEL RESECTION     Patient location during evaluation: PACU Anesthesia Type: General Level of consciousness: awake and alert Pain management: pain level controlled Vital Signs Assessment: post-procedure vital signs reviewed and stable Respiratory status: spontaneous breathing, nonlabored ventilation, respiratory function stable and patient connected to nasal cannula oxygen Cardiovascular status: blood pressure returned to baseline and stable Postop Assessment: no apparent nausea or vomiting Anesthetic complications: no   No notable events documented.              Shelton Silvas

## 2023-07-30 NOTE — Progress Notes (Signed)
[facility] PROGRESS NOTE  Laura Bradshaw (DOB: 11-14-1950) ZOX:[id number] PCP: Alberteen Sam, FNP  Brief Narrative: Laura Bradshaw is a 72 y.o. female with PMH of HTN, anxiety, depression, COPD, HSV, low back pain and mild dementia presenting with acute abdominal pain and vomiting. She was found to have closed-loop bowel obstruction and ischemia, taken to the OR for laparoscopic, converted to open, small bowel resection with intraoperative findings of perforated closed loop obstruction with ischemic jejunum.  Subjective: Vomiting started overnight and has continued through the morning, denied abd pain earlier, but a bit more uncomfortable now. No dyspnea or other complaints.   Objective: BP (!) 149/72 (BP Location: Left Arm)   Pulse 96   Temp 98.1 F (36.7 C)   Resp 16   Ht 5\' 1"  (1.549 m)   Wt 67.6 kg   SpO2 91%   BMI 28.15 kg/m   Gen: No distress Pulm: Clear, nonlabored  CV: RRR, no MRG, borderline tachycardia, no edema GI: Soft, not significantly tender, distended more than exam yesterday, very hypoactive BS.  Neuro: Rousable, oriented. No new focal deficits. Ext: Warm, no deformities Skin: Midline incision not examined. No new rashes, lesions or ulcers on visualized skin   Assessment & Plan: Small bowel obstruction, perforation, jejunal ischemia: s/p resection 12/5 by Dr. Maisie Fus.  - NG out 12/8, having recurrent vomiting, abdominal distention (worse on serial exam today compared to the weekend). XR shows bowel dilatation (formal read pending), will reinsert NG. Likely can give gastrografin and scan 12/10.  - Surg path pending. - Pain control as ordered.   Hypokalemia:  - Replace by IV, will add to MIVF as well as she's NPO with GI losses.   HTN: - Stable, restarted lisinopril.    Anxiety and depression: Stable - Resume home meds    COPD:  - Restart controller med and prn albuterol neb. No lower wheezing to suggest exacerbation at this time.  -  Encourage IS (at bedside)   Mild hypercalcemia: Likely due to dehydration, resolved.   Leukocytosis: Resolved, then recurrent postoperatively.  - Restarted zosyn pending cultures, no operative culture sent. - Blood cultures are NGTD - May re-CT, defer to surgery. No alternative symptoms of infection at this time, but will monitor closely.   Hyperglycemia, prediabetes: HbA1c 6.1%.  - Outpatient follow up recommended.   Tyrone Nine, MD [facility] [email or web address] 07/30/2023, 12:07 PM

## 2023-07-30 NOTE — Plan of Care (Signed)

## 2023-07-31 ENCOUNTER — Other Ambulatory Visit: Payer: Self-pay

## 2023-07-31 ENCOUNTER — Inpatient Hospital Stay (HOSPITAL_COMMUNITY): Payer: Medicare PPO

## 2023-07-31 DIAGNOSIS — E440 Moderate protein-calorie malnutrition: Secondary | ICD-10-CM | POA: Insufficient documentation

## 2023-07-31 DIAGNOSIS — K56609 Unspecified intestinal obstruction, unspecified as to partial versus complete obstruction: Secondary | ICD-10-CM | POA: Diagnosis not present

## 2023-07-31 LAB — CBC
HCT: 31.5 % — ABNORMAL LOW (ref 36.0–46.0)
Hemoglobin: 10.4 g/dL — ABNORMAL LOW (ref 12.0–15.0)
MCH: 31.8 pg (ref 26.0–34.0)
MCHC: 33 g/dL (ref 30.0–36.0)
MCV: 96.3 fL (ref 80.0–100.0)
Platelets: 277 10*3/uL (ref 150–400)
RBC: 3.27 MIL/uL — ABNORMAL LOW (ref 3.87–5.11)
RDW: 14 % (ref 11.5–15.5)
WBC: 14.9 10*3/uL — ABNORMAL HIGH (ref 4.0–10.5)
nRBC: 0.1 % (ref 0.0–0.2)

## 2023-07-31 LAB — BASIC METABOLIC PANEL
Anion gap: 10 (ref 5–15)
BUN: 19 mg/dL (ref 8–23)
CO2: 26 mmol/L (ref 22–32)
Calcium: 10.1 mg/dL (ref 8.9–10.3)
Chloride: 104 mmol/L (ref 98–111)
Creatinine, Ser: 0.7 mg/dL (ref 0.44–1.00)
GFR, Estimated: >60 mL/min (ref >60)
Glucose, Bld: 123 mg/dL — ABNORMAL HIGH (ref 70–99)
Potassium: 3.7 mmol/L (ref 3.5–5.1)
Sodium: 140 mmol/L (ref 135–145)

## 2023-07-31 LAB — CULTURE, BLOOD (ROUTINE X 2)
Culture: NO GROWTH
Culture: NO GROWTH
Special Requests: ADEQUATE

## 2023-07-31 LAB — GLUCOSE, CAPILLARY
Glucose-Capillary: 115 mg/dL — ABNORMAL HIGH (ref 70–99)
Glucose-Capillary: 69 mg/dL — ABNORMAL LOW (ref 70–99)

## 2023-07-31 LAB — PREALBUMIN: Prealbumin: 5 mg/dL — ABNORMAL LOW (ref 18–38)

## 2023-07-31 MED ORDER — CHLORHEXIDINE GLUCONATE CLOTH 2 % EX PADS
6.0000 | MEDICATED_PAD | Freq: Every day | CUTANEOUS | Status: DC
  Administered 2023-08-01 – 2023-08-12 (×13): 6 via TOPICAL

## 2023-07-31 MED ORDER — SODIUM CHLORIDE 0.9% FLUSH
10.0000 mL | Freq: Two times a day (BID) | INTRAVENOUS | Status: DC
  Administered 2023-08-01: 10 mL
  Administered 2023-08-01: 20 mL
  Administered 2023-08-02 – 2023-08-12 (×20): 10 mL

## 2023-07-31 MED ORDER — INSULIN ASPART 100 UNIT/ML IJ SOLN
0.0000 [IU] | Freq: Four times a day (QID) | INTRAMUSCULAR | Status: DC
Start: 2023-07-31 — End: 2023-08-05
  Administered 2023-08-01 – 2023-08-04 (×10): 1 [IU] via SUBCUTANEOUS
  Administered 2023-08-05: 2 [IU] via SUBCUTANEOUS

## 2023-07-31 MED ORDER — SODIUM CHLORIDE 0.9% FLUSH: 10.0000 mL | INTRAVENOUS | Status: DC | PRN

## 2023-07-31 MED ORDER — POTASSIUM CHLORIDE 10 MEQ/100ML IV SOLN
10.0000 meq | INTRAVENOUS | Status: AC
  Administered 2023-07-31 (×4): 10 meq via INTRAVENOUS
  Filled 2023-07-31 (×4): qty 100

## 2023-07-31 MED ORDER — FAT EMUL FISH OIL/PLANT BASED 20% (SMOFLIPID)IV EMUL
250.0000 mL | INTRAVENOUS | Status: AC
  Administered 2023-07-31: 250 mL via INTRAVENOUS
  Filled 2023-07-31: qty 250

## 2023-07-31 MED ORDER — TRACE MINERALS CU-MN-SE-ZN 300-55-60-3000 MCG/ML IV SOLN
INTRAVENOUS | Status: AC
  Filled 2023-07-31: qty 960

## 2023-07-31 MED ORDER — OXYCODONE HCL 5 MG PO TABS: 2.5000 mg | ORAL_TABLET | Freq: Four times a day (QID) | ORAL | Status: DC | PRN

## 2023-07-31 MED ORDER — MORPHINE SULFATE (PF) 2 MG/ML IV SOLN
1.0000 mg | Freq: Once | INTRAVENOUS | Status: AC | PRN
  Administered 2023-07-31: 1 mg via INTRAVENOUS
  Filled 2023-07-31: qty 1

## 2023-07-31 MED ORDER — IOHEXOL 300 MG/ML  SOLN
100.0000 mL | Freq: Once | INTRAMUSCULAR | Status: AC | PRN
  Administered 2023-07-31: 100 mL via INTRAVENOUS

## 2023-07-31 MED ORDER — KCL IN DEXTROSE-NACL 40-5-0.45 MEQ/L-%-% IV SOLN
INTRAVENOUS | Status: AC
  Administered 2023-07-31: 1000 mL via INTRAVENOUS
  Filled 2023-07-31 (×2): qty 1000

## 2023-07-31 NOTE — Progress Notes (Addendum)
PHARMACY - TOTAL PARENTERAL NUTRITION CONSULT NOTE   Indication: Prolonged ileus  Patient Measurements: Height: 5\' 1"  (154.9 cm) Weight: 67.6 kg (149 lb) IBW/kg (Calculated) : 47.8 TPN AdjBW (KG): 52.7 Body mass index is 28.15 kg/m. Usual Weight:   Assessment:  Pharmacy is consulted to start TPN on 72 yo female with a prolonged ileus. Pt was taken emergently to OR on 12/5 after CT was concerning for closed loop bowel obstruction with ischemia.   Glucose / Insulin:  - HbA1c 6.1%. prediabetic  - BG on CMP have been under 150  Electrolytes:  - K+ 3.7 - CorrCa is slightly elevated but not a cancer pt  - Other electrolytes are WNL  - Keep K >4.0 and Mg >2.0  for SBO Renal:  - Scr <1 - BUN 19  Hepatic:  - WNL on 12/8  Intake / Output; MIVF:  - UOP uncharted, 2450 mL output through NG tube  - On D5 1/2NS with 40 meq KCl at 85 ml/hr GI Imaging: GI Surgeries / Procedures:  - 12/5 SBO and perforated closed loop bowel obstruction with ischemic jejunum   Central access: 12/10 TPN start date: 12/10   Nutritional Goals: pending RD evaluation    Due to the Baxter IV fluid disruption, all adult parenteral nutrition will utilize premade Clinimix products +/- fat emulsion infusion. Our goal will be to continue providing as close to 100% of our patient's nutritional needs, but due to limited TPN Clinimix concentrations we will meet at least 80% of protein and 75% of kcal goals.    RD Assessment:      Current Nutrition:  NPO  Plan:  NOW - Potassium chloride 10 meq IV x 4    Start Clinimix E 8/10 at 40 mL/hr at 1800 Electrolytes in TPN (per liter, non-adjustable):  Na - 35 mEq/L K - 30 mEq/L Ca - 5 mEq/L Mg - 5 mEq/L Phos - 15 mmol/L Cl:Ac ratio - ~1:1 Add standard MVI and trace elements to TPN Initiate Sensitive q6h SSI and adjust as needed  Reduce MIVF to 50 mL/hr at 1800, BMP, magnesium, phosphorus with AM labs   Monitor TPN labs on Mon/Thurs    Adalberto Cole, PharmD, BCPS 07/31/2023 11:01 AM

## 2023-07-31 NOTE — Progress Notes (Addendum)
[facility] PROGRESS NOTE  Laura Bradshaw (DOB: 09-Feb-1951) QVZ:[id number] PCP: Laura Sam, FNP  Brief Narrative: Laura Bradshaw is a 72 y.o. female with PMH of HTN, anxiety, depression, COPD, HSV, low back pain and mild dementia presenting with acute abdominal pain and vomiting. She was found to have closed-loop bowel obstruction and ischemia, taken to the OR for laparoscopic, converted to open, small bowel resection with intraoperative findings of perforated closed loop obstruction with ischemic jejunum.  Subjective: Felt better after NG replaced yesterday. Her pain is much improved, not requiring much prn.   Objective: BP (!) 170/76 (BP Location: Right Arm)   Pulse 83   Temp 97.9 F (36.6 C) (Oral)   Resp 18   Ht 5\' 1"  (1.549 m)   Wt 67.6 kg   SpO2 96%   BMI 28.15 kg/m   Gen: No distress, elderly and pleasant Pulm: No crackles or wheezes anterolaterally  CV: RRR, no MRG, no pitting edema GI: Soft, protuberant but only tender to deeper palpation. Midline wound with wound vac in place, edges appear healthy. Neuro: Alert and oriented. No new focal deficits. Ext: Warm, no deformities. Skin: No other rashes, lesions or ulcers on visualized skin   Assessment & Plan: Small bowel obstruction, perforation, jejunal ischemia: s/p resection 12/5 by Dr. Maisie Bradshaw.  - Continue NGT - Abd CT to r/o abscess per surgery - Wound vac per surgery - Surg path pending. - Pain control as ordered. Limit narcotics as able. - CT suggests ongoing ileus, plan to start TPN   Hypokalemia:  - Replace by IV, will add to MIVF as well as she's NPO with GI losses.   HTN: - Stable, restarted lisinopril, will be holding until po intake is possible.   Anxiety and depression: Stable - Resume home meds when able to absorb anything po.   COPD:  - Continue controller med and prn albuterol neb.  - Decrease rate of IVF given small pleural effusions - Encourage IS (at bedside)   Mild  hypercalcemia: Likely due to dehydration, resolved.   Leukocytosis: Resolved, then recurrent postoperatively. Improving on 12/10.  - Restarted zosyn. Blood cultures negative from 12/5. No operative culture sent. - Blood cultures are NGTD   Hyperglycemia, prediabetes: HbA1c 6.1%.  - Outpatient follow up recommended.   Laura Nine, MD [facility] [email or web address] 07/31/2023, 5:40 PM

## 2023-07-31 NOTE — Progress Notes (Signed)
OT Cancellation Note  Patient Details Name: Laura Bradshaw MRN: [id number] DOB: 08/22/1950   Cancelled Treatment:    Reason Eval/Treat Not Completed: Medical issues which prohibited therapy Patient reporting still not feeling up to it with pending CT scan later today. Daughter present in room. OT to continue to follow and check back as schedule will allow.  Rosalio Loud, MS Acute Rehabilitation Department Office# [phone]  07/31/2023, 10:01 AM

## 2023-07-31 NOTE — Plan of Care (Signed)
  Problem: Education: Goal: Knowledge of General Education information will improve Description: Including pain rating scale, medication(s)/side effects and non-pharmacologic comfort measures 07/31/2023 1728 by Genevie Ann, RN Outcome: Progressing 07/31/2023 1725 by Genevie Ann, RN Outcome: Progressing   Problem: Health Behavior/Discharge Planning: Goal: Ability to manage health-related needs will improve 07/31/2023 1728 by Genevie Ann, RN Outcome: Progressing 07/31/2023 1725 by Genevie Ann, RN Outcome: Progressing

## 2023-07-31 NOTE — Progress Notes (Signed)
Progress Note  5 Days Post-Op  Subjective: Pt had NGT replaced yesterday for n/v and ongoing post-op ileus. Frustrated that she has not been able to drink fluids. VAC placed to midline yesterday. Daughter at bedside.   Objective: Vital signs in last 24 hours: Temp:  [98.1 F (36.7 C)-99.7 F (37.6 C)] 98.1 F (36.7 C) (12/10 0530) Pulse Rate:  [82-98] 82 (12/10 0530) Resp:  [16] 16 (12/10 0530) BP: (142-161)/(80) 161/80 (12/10 0530) SpO2:  [89 %-98 %] 97 % (12/10 0826) Last BM Date : 07/25/23  Intake/Output from previous day: 12/09 0701 - 12/10 0700 In: 1051.8 [I.V.:151; IV Piggyback:900.8] Out: 2450 [Emesis/NG output:2450] Intake/Output this shift: No intake/output data recorded.  PE: General: pleasant, WD, WN female who is laying in bed in NAD Lungs: Respiratory effort nonlabored Abd: soft, appropriately ttp, midline VAC with good seal, NGT with bilious output   Lab Results:  Recent Labs    07/30/23 0500 07/31/23 0447  WBC 17.2* 14.9*  HGB 11.2* 10.4*  HCT 34.6* 31.5*  PLT 305 277   BMET Recent Labs    07/30/23 0500 07/31/23 0447  NA 138 140  K 2.6* 3.7  CL 103 104  CO2 24 26  GLUCOSE 137* 123*  BUN 15 19  CREATININE 0.61 0.70  CALCIUM 10.5* 10.1   PT/INR No results for input(s): "LABPROT", "INR" in the last 72 hours. CMP     Component Value Date/Time   NA 140 07/31/2023 0447   K 3.7 07/31/2023 0447   CL 104 07/31/2023 0447   CO2 26 07/31/2023 0447   GLUCOSE 123 (H) 07/31/2023 0447   BUN 19 07/31/2023 0447   CREATININE 0.70 07/31/2023 0447   CALCIUM 10.1 07/31/2023 0447   PROT 5.6 (L) 07/29/2023 0750   ALBUMIN 2.6 (L) 07/29/2023 0750   AST 16 07/29/2023 0750   ALT 18 07/29/2023 0750   ALKPHOS 41 07/29/2023 0750   BILITOT 0.7 07/29/2023 0750   GFRNONAA >60 07/31/2023 0447   GFRAA >60 01/06/2018 1315   Lipase     Component Value Date/Time   LIPASE 25 07/26/2023 1358       Studies/Results: [place] EKG SITE RITE  Result Date:  07/31/2023 If Site Rite image not attached, placement could not be confirmed due to current cardiac rhythm.  DG Abd Portable 1V  Result Date: 07/30/2023 CLINICAL DATA:  Nasogastric tube placement. EXAM: PORTABLE ABDOMEN - 1 VIEW COMPARISON:  07/30/2023 at 1104 hours. FINDINGS: Nasogastric tube terminates in the gastric antrum. Persistent gaseous distention of small bowel in the mid abdomen. IMPRESSION: 1. Satisfactory nasogastric tube placement. 2. Persistent small bowel obstruction. Electronically Signed   By: Leanna Battles M.D.   On: 07/30/2023 15:00   DG Abd Portable 1V  Result Date: 07/30/2023 CLINICAL DATA:  Nausea and vomiting. EXAM: PORTABLE ABDOMEN - 1 VIEW COMPARISON:  CT abdomen pelvis 07/26/2023. FINDINGS: Present gaseous distention of small bowel. No colonic gas. Air and fluid in the stomach. No unexpected radiopaque calculi. Dextroconvex scoliosis of the lumbar spine with multilevel degenerative changes. IMPRESSION: Persistent small bowel obstruction. Electronically Signed   By: Leanna Battles M.D.   On: 07/30/2023 14:59    Anti-infectives: Anti-infectives (From admission, onward)    Start     Dose/Rate Route Frequency Ordered Stop   07/28/23 1330  piperacillin-tazobactam (ZOSYN) IVPB 3.375 g        3.375 g 12.5 mL/hr over 240 Minutes Intravenous Every 8 hours 07/28/23 1232     07/26/23 1745  piperacillin-tazobactam (  ZOSYN) IVPB 3.375 g        3.375 g 12.5 mL/hr over 240 Minutes Intravenous  Once 07/26/23 1737 07/26/23 1931        Assessment/Plan  Closed loop SBO with ischemic small bowel and perforation  POD5 s/p laparoscopy converted to open small bowel resection Dr. Maisie Fus - post-op ileus  - WBC trending down and afebrile  - VAC M/W/F - CT AP today in setting of ileus to r/o abscess as contributing factor - continue to mobilize as tolerated - Keep K >4.0 and Mg >2.0  Severe protein calorie malnutrition - PICC and TPN ordered since prealbumin is 5  FEN: NPO,  IVF, NGT to LIWS VTE: LMWH ID: Zosyn 12/5>>  LOS: 5 days     Juliet Rude, [facility] Surgery 07/31/2023, 9:26 AM Please see Amion for pager number during day hours 7:00am-4:30pm

## 2023-07-31 NOTE — Progress Notes (Signed)
Peripherally Inserted Central Catheter Placement  The IV Nurse has discussed with the patient and/or persons authorized to consent for the patient, the purpose of this procedure and the potential benefits and risks involved with this procedure.  The benefits include less needle sticks, lab draws from the catheter, and the patient may be discharged home with the catheter. Risks include, but not limited to, infection, bleeding, blood clot (thrombus formation), and puncture of an artery; nerve damage and irregular heartbeat and possibility to perform a PICC exchange if needed/ordered by physician.  Alternatives to this procedure were also discussed.  Bard Power PICC patient education guide, fact sheet on infection prevention and patient information card has been provided to patient /or left at bedside.    PICC Placement Documentation  PICC Double Lumen 07/31/23 Right Basilic 36 cm 0 cm (Active)  Indication for Insertion or Continuance of Line Administration of hyperosmolar/irritating solutions (i.e. TPN, Vancomycin, etc.) 07/31/23 1742  Exposed Catheter (cm) 0 cm 07/31/23 1742  Site Assessment Clean, Dry, Intact 07/31/23 1742  Lumen #1 Status Flushed;Saline locked;Blood return noted 07/31/23 1742  Lumen #2 Status Flushed;Saline locked;Blood return noted 07/31/23 1742  Dressing Type Transparent;Securing device 07/31/23 1742  Dressing Status Antimicrobial disc in place;Clean, Dry, Intact 07/31/23 1742  Line Care Connections checked and tightened 07/31/23 1742  Line Adjustment (NICU/IV Team Only) No 07/31/23 1742  Dressing Intervention New dressing;Adhesive placed at insertion site (IV team only);Other (Comment) 07/31/23 1742  Dressing Change Due 08/07/23 07/31/23 1742       Annett Fabian 07/31/2023, 5:43 PM

## 2023-07-31 NOTE — Plan of Care (Signed)
  Problem: Education: Goal: Knowledge of General Education information will improve Description Including pain rating scale, medication(s)/side effects and non-pharmacologic comfort measures Outcome: Progressing   Problem: Health Behavior/Discharge Planning: Goal: Ability to manage health-related needs will improve Outcome: Progressing   

## 2023-07-31 NOTE — Progress Notes (Signed)
PT Cancellation Note  Patient Details Name: Laura Bradshaw MRN: [id number] DOB: 1951/02/19   Cancelled Treatment:    Reason Eval/Treat Not Completed: Fatigue/lethargy limiting ability to participate--spoke with daughters-pt fatigued after busy day. will check back as schedule allows.    Faye Ramsay, PT Acute Rehabilitation  Office: [phone]

## 2023-07-31 NOTE — Progress Notes (Addendum)
Initial Nutrition Assessment  DOCUMENTATION CODES:   Non-severe (moderate) malnutrition in context of acute illness/injury  INTERVENTION:   Monitor magnesium, potassium, and phosphorus for at least 3 days, MD to replete as needed, as pt is at risk for refeeding syndrome.   -TPN management per Pharmacy -Recommend 100 mg Thiamine daily x 5 days -Daily weights while on TPN  NUTRITION DIAGNOSIS:   Moderate Malnutrition related to acute illness as evidenced by mild fat depletion, mild muscle depletion, energy intake < 75% for > 7 days.  GOAL:   Patient will meet greater than or equal to 90% of their needs  MONITOR:   Diet advancement, Weight trends, I & O's, Labs (TPN)  REASON FOR ASSESSMENT:   Consult, Malnutrition Screening Tool New TPN/TNA  ASSESSMENT:   72 y.o. female with PMH of HTN, anxiety, depression, COPD, HSV, low back pain and mild dementia presenting with acute abdominal pain and vomiting. She was found to have closed-loop bowel obstruction and ischemia, taken to the OR for laparoscopic, converted to open, small bowel resection with intraoperative findings of perforated closed loop obstruction with ischemic jejunum.  12/5: admitted, s/p open SB resection 12/9: NGT placed for suction, Wound VAC placed  Patient in room, daughter at bedside. Pt reports last eating a hot dog on 12/4. This was the last thing she has eaten, having N/V since. Typically eats 2 meals a day, lunch and dinner. Likely not meeting needs prior to existing symptoms. Will be at refeeding syndrome risk.  NGT in place, set to suction, output: ~2150 ml  TPN to being tonight: Clinimix E 8/10 @ 40 ml/hr + Smoflipid infusion, provides 1137 kcals and 76g protein.  Per weight records, no weight loss noted at admission. Needs to be rechecked, daily weights per TPN protocol, will monitor.  Medications: D5 infusion, KCl,  Labs reviewed.  NUTRITION - FOCUSED PHYSICAL EXAM:  Flowsheet Row Most Recent  Value  Orbital Region Mild depletion  Upper Arm Region No depletion  Thoracic and Lumbar Region Unable to assess  Buccal Region Mild depletion  Temple Region Mild depletion  Clavicle Bone Region Mild depletion  Clavicle and Acromion Bone Region Mild depletion  Scapular Bone Region Mild depletion  Dorsal Hand Mild depletion  Patellar Region Unable to assess  Anterior Thigh Region Unable to assess  Posterior Calf Region Unable to assess  Edema (RD Assessment) None  Hair Reviewed  Eyes Reviewed  Mouth Reviewed  Skin Reviewed  Nails Reviewed       Diet Order:   Diet Order             Diet NPO time specified Except for: Sips with Meds  Diet effective midnight                   EDUCATION NEEDS:   Education needs have been addressed  Skin:  Skin Assessment: Skin Integrity Issues: Skin Integrity Issues:: Incisions Incisions: 12/5 abdomen  Last BM:  12/4  Height:   Ht Readings from Last 1 Encounters:  07/26/23 5\' 1"  (1.549 m)    Weight:   Wt Readings from Last 1 Encounters:  07/26/23 67.6 kg    BMI:  Body mass index is 28.15 kg/m.  Estimated Nutritional Needs:   Kcal:  1600-1800  Protein:  80-90g  Fluid:  1.8L/day  Tilda Franco, MS, RD, LDN Inpatient Clinical Dietitian Contact via Secure chat

## 2023-08-01 DIAGNOSIS — E440 Moderate protein-calorie malnutrition: Secondary | ICD-10-CM

## 2023-08-01 DIAGNOSIS — E871 Hypo-osmolality and hyponatremia: Secondary | ICD-10-CM

## 2023-08-01 DIAGNOSIS — D72829 Elevated white blood cell count, unspecified: Secondary | ICD-10-CM | POA: Diagnosis not present

## 2023-08-01 DIAGNOSIS — K56609 Unspecified intestinal obstruction, unspecified as to partial versus complete obstruction: Secondary | ICD-10-CM | POA: Diagnosis not present

## 2023-08-01 LAB — BASIC METABOLIC PANEL
Anion gap: 7 (ref 5–15)
BUN: 17 mg/dL (ref 8–23)
CO2: 24 mmol/L (ref 22–32)
Calcium: 9.3 mg/dL (ref 8.9–10.3)
Chloride: 101 mmol/L (ref 98–111)
Creatinine, Ser: 0.53 mg/dL (ref 0.44–1.00)
GFR, Estimated: >60 mL/min (ref >60)
Glucose, Bld: 126 mg/dL — ABNORMAL HIGH (ref 70–99)
Potassium: 3.5 mmol/L (ref 3.5–5.1)
Sodium: 132 mmol/L — ABNORMAL LOW (ref 135–145)

## 2023-08-01 LAB — CBC
HCT: 29.2 % — ABNORMAL LOW (ref 36.0–46.0)
Hemoglobin: 9.6 g/dL — ABNORMAL LOW (ref 12.0–15.0)
MCH: 31.6 pg (ref 26.0–34.0)
MCHC: 32.9 g/dL (ref 30.0–36.0)
MCV: 96.1 fL (ref 80.0–100.0)
Platelets: 254 10*3/uL (ref 150–400)
RBC: 3.04 MIL/uL — ABNORMAL LOW (ref 3.87–5.11)
RDW: 13.9 % (ref 11.5–15.5)
WBC: 15.5 10*3/uL — ABNORMAL HIGH (ref 4.0–10.5)
nRBC: 0.5 % — ABNORMAL HIGH (ref 0.0–0.2)

## 2023-08-01 LAB — MAGNESIUM: Magnesium: 2.2 mg/dL (ref 1.7–2.4)

## 2023-08-01 LAB — GLUCOSE, CAPILLARY
Glucose-Capillary: 131 mg/dL — ABNORMAL HIGH (ref 70–99)
Glucose-Capillary: 123 mg/dL — ABNORMAL HIGH (ref 70–99)
Glucose-Capillary: 117 mg/dL — ABNORMAL HIGH (ref 70–99)

## 2023-08-01 LAB — PHOSPHORUS: Phosphorus: 2.4 mg/dL — ABNORMAL LOW (ref 2.5–4.6)

## 2023-08-01 MED ORDER — POTASSIUM CHLORIDE 10 MEQ/100ML IV SOLN: 10.0000 meq | INTRAVENOUS | Status: DC

## 2023-08-01 MED ORDER — POTASSIUM CHLORIDE 10 MEQ/100ML IV SOLN
10.0000 meq | INTRAVENOUS | Status: AC
  Administered 2023-08-01 (×4): 10 meq via INTRAVENOUS
  Filled 2023-08-01 (×4): qty 100

## 2023-08-01 MED ORDER — KCL IN DEXTROSE-NACL 40-5-0.45 MEQ/L-%-% IV SOLN
INTRAVENOUS | Status: AC
  Filled 2023-08-01: qty 1000

## 2023-08-01 MED ORDER — MORPHINE SULFATE (PF) 2 MG/ML IV SOLN
1.0000 mg | INTRAVENOUS | Status: AC | PRN
  Administered 2023-08-01: 1 mg via INTRAVENOUS
  Filled 2023-08-01: qty 1

## 2023-08-01 MED ORDER — FAT EMUL FISH OIL/PLANT BASED 20% (SMOFLIPID)IV EMUL
250.0000 mL | INTRAVENOUS | Status: AC
  Administered 2023-08-01: 250 mL via INTRAVENOUS
  Filled 2023-08-01: qty 250

## 2023-08-01 MED ORDER — LORAZEPAM 2 MG/ML IJ SOLN: 0.2500 mg | Freq: Two times a day (BID) | INTRAMUSCULAR | Status: DC | PRN

## 2023-08-01 MED ORDER — METHOCARBAMOL 1000 MG/10ML IJ SOLN
500.0000 mg | Freq: Four times a day (QID) | INTRAMUSCULAR | Status: DC
  Administered 2023-08-01 – 2023-08-02 (×7): 500 mg via INTRAVENOUS
  Filled 2023-08-01 (×7): qty 10

## 2023-08-01 MED ORDER — HYDRALAZINE HCL 20 MG/ML IJ SOLN
10.0000 mg | Freq: Four times a day (QID) | INTRAMUSCULAR | Status: DC | PRN
  Administered 2023-08-01 (×2): 10 mg via INTRAVENOUS
  Filled 2023-08-01 (×2): qty 1

## 2023-08-01 MED ORDER — TRACE MINERALS CU-MN-SE-ZN 300-55-60-3000 MCG/ML IV SOLN
INTRAVENOUS | Status: AC
  Filled 2023-08-01 (×2): qty 960

## 2023-08-01 MED ORDER — SODIUM PHOSPHATES 45 MMOLE/15ML IV SOLN
15.0000 mmol | Freq: Once | INTRAVENOUS | Status: AC
  Administered 2023-08-01: 15 mmol via INTRAVENOUS
  Filled 2023-08-01: qty 5

## 2023-08-01 MED ORDER — ACETAMINOPHEN 10 MG/ML IV SOLN
1000.0000 mg | Freq: Four times a day (QID) | INTRAVENOUS | Status: AC
  Administered 2023-08-01 – 2023-08-02 (×3): 1000 mg via INTRAVENOUS
  Filled 2023-08-01 (×4): qty 100

## 2023-08-01 MED ORDER — LIDOCAINE 5 % EX PTCH
1.0000 | MEDICATED_PATCH | CUTANEOUS | Status: DC
  Administered 2023-08-01 – 2023-08-02 (×2): 1 via TRANSDERMAL
  Filled 2023-08-01 (×2): qty 1

## 2023-08-01 NOTE — Plan of Care (Signed)
  Problem: Education: Goal: Knowledge of General Education information will improve Description: Including pain rating scale, medication(s)/side effects and non-pharmacologic comfort measures Outcome: Progressing   Problem: Clinical Measurements: Goal: Ability to maintain clinical measurements within normal limits will improve Outcome: Progressing Goal: Will remain free from infection Outcome: Progressing   Problem: Nutrition: Goal: Adequate nutrition will be maintained Outcome: Progressing   Problem: Elimination: Goal: Will not experience complications related to urinary retention Outcome: Progressing   Problem: Pain Management: Goal: General experience of comfort will improve Outcome: Progressing   Problem: Safety: Goal: Ability to remain free from injury will improve Outcome: Progressing   Problem: Skin Integrity: Goal: Risk for impaired skin integrity will decrease Outcome: Progressing

## 2023-08-01 NOTE — Progress Notes (Signed)
PHARMACY - TOTAL PARENTERAL NUTRITION CONSULT NOTE   Indication: Prolonged ileus  Patient Measurements: Height: 5\' 1"  (154.9 cm) Weight: 67.6 kg (149 lb) IBW/kg (Calculated) : 47.8 TPN AdjBW (KG): 52.7 Body mass index is 28.15 kg/m. Usual Weight:   Assessment:  Pharmacy is consulted to start TPN on 72 yo female with a prolonged ileus. Pt was taken emergently to OR on 12/5 after CT was concerning for closed loop bowel obstruction with ischemia.   Glucose / Insulin:  - HbA1c 6.1%. prediabetic  - CBG range 115-131 since start of TPN - 1 unit of insulin on SSI given Electrolytes:  - K+ 3.5, low  - Phosphorus 2.4, low. Only previous level on 12/6 was 3.1  - Na low at 132  - CorrCa is slightly elevated at 10.4 and has improved  - Other electrolytes are WNL  - Keep K >4.0 and Mg >2.0  for SBO Renal:  - Scr <1 - BUN 19  Hepatic:  - WNL on 12/8  Intake / Output; MIVF:  - UOP uncharted, 2450 mL output through NG tube  - On D5 1/2NS with 40 meq KCl at 85 ml/hr GI Imaging: GI Surgeries / Procedures:  - 12/5 SBO and perforated closed loop bowel obstruction with ischemic jejunum   Central access: 12/10 TPN start date: 12/10   Nutritional Goals:   Clinimix E 8/10 1000 mL daily will provide 80 grams of protein at 1164 Kcal with Lipids. D10 at 60 ml/hr will provide 490 kcal for a total of 1654 kcal. This will meet both the protein and calorie goals    Due to the Baxter IV fluid disruption, all adult parenteral nutrition will utilize premade Clinimix products +/- fat emulsion infusion. Our goal will be to continue providing as close to 100% of our patient's nutritional needs, but due to limited TPN Clinimix concentrations we will meet at least 80% of protein and 75% of kcal goals.    RD Assessment:   Estimated Needs Total Energy Estimated Needs: 1600-1800 Total Protein Estimated Needs: 80-90g Total Fluid Estimated Needs: 1.8L/day  Current Nutrition:  NPO  Plan:  NOW -  Potassium chloride 10 meq IV x 4  - Sodium phosphate 15 mmol already ordered by MD    Continue  Clinimix E 8/10 at 40 mL/hr at 1800 Will not add D10 orders yet until electrolytes have stabilized.  Electrolytes in TPN (per liter, non-adjustable):  Na - 35 mEq/L K - 30 mEq/L Ca - 5 mEq/L Mg - 5 mEq/L Phos - 15 mmol/L Cl:Ac ratio - ~1:1 Add standard MVI and trace elements to TPN Continue  Sensitive q6h SSI and adjust as needed  Continue MIVF to 50 mL/hr at 1800, Monitor TPN labs on Mon/Thurs    Adalberto Cole, PharmD, BCPS 08/01/2023 10:55 AM

## 2023-08-01 NOTE — Progress Notes (Signed)
OT Cancellation Note  Patient Details Name: KWANZAA FUJITANI MRN: [id number] DOB: 1950/12/31   Cancelled Treatment:    Reason Eval/Treat Not Completed: Patient declined, no reason specified Approached patient this AM with daughters in room. Patient and family declining to participate at this time reporting bad night and just taken pain meds. Patient's family told to let nursing know to message therapy when patient is feeling better later today. No message was received. Patient was approached later this AM with patient having just gotten to chair with nursing after bath and declining therapy. OT to continue to follow and check back as schedule will allow.  Rosalio Loud, MS Acute Rehabilitation Department Office# [phone]  08/01/2023, 1:40 PM

## 2023-08-01 NOTE — Progress Notes (Signed)
PT Cancellation Note  Patient Details Name: Laura Bradshaw MRN: [id number] DOB: Aug 03, 1951   Cancelled Treatment:    Reason Eval/Treat Not Completed: Fatigue/lethargy limiting ability to participate. PT arrived and pt seated in recliner. Pt declined due to fatigue. PT to continue to follow acutely.   [name], PT Acute Rehab   [name] 08/01/2023, 1:40 PM

## 2023-08-01 NOTE — Hospital Course (Addendum)
The patient is a 15 with history of HTN, anxiety, depression, COPD, HSV, low back pain, mild dementia comes to the hospital with acute abdominal pain and vomiting.  She was found to have closed-loop obstruction with ischemia.  Taken to the OR for laparoscopic which was eventually converted to open requiring small bowel resection due to perforation on 07/26/23 by Dr. Maisie Fus.   Postop developed ileus requiring NG tube later repeat scan showed intra-abdominal abscess therefore IR consulted.  ID has been following and patient has been on broad-spectrum IV antibiotics.  Patient underwent CT-guided right abdominal/pelvic asp/drainage on 08/06/23.  Her p.o. intake continues to improve and TPN has been weaned off.  Her left pelvic/transgluteal drain is removed today and cultures are also growing rare Candida albicans so ID added micafungin for the abscess culture but are hoping that is fluconazole sensitive.  Continue IV Zosyn and micafungin for now.  She did desaturate on home ambulatory O2 screen today and PT OT recommending discharging back home when she is medically stable.  Assessment & Plan:  Principal Problem:   Intra-abdominal abscess (HCC) Active Problems:   Small bowel obstruction (HCC)   Malnutrition of moderate degree   Small bowel obstruction, perforation with ischemia requiring open resection Postop complicated by ileus and intra-abdominal abscesses -Initial surgery performed on 12/5.  Postop developed ileus requiring NG tube which has now been removed.   -Repeat scan shows concerns of abscess therefore IR performed CT-guided drainage of the right abdominal/pelvic abscess and 2 drains were placed.  Currently one of them has been removed -WBC Trend: Recent Labs  Lab 08/04/23 0009 08/05/23 0253 08/06/23 0231 08/07/23 0227 08/08/23 0346 08/09/23 0301 08/10/23 0346  WBC 26.5* 25.0* 24.8* 19.0* 17.0* 17.9* 18.3*  -Follow cultures, tailor antibiotics.  Advised out of bed to chair -Was on  empiric Zosyn, ID following have added Micafungin; now infectious disease has changed her to Unasyn and changing to fluconazole given the fact that most Candida albicans species are sensitive to the azoles: Per ID if she does well on Unasyn and the regular transition to oral equivalent of Augmentin -TPN was weaned and discontinued -Continue with pain control per general surgery recommendations and she is on cyclobenzaprine 10 mg p.o. nightly, lidocaine 2 patches transdermally q. 12, ketorolac 50 mg IV every 8 as needed for moderate pain as well as methocarbamol as well as IV morphine 1 mg every 4 as needed severe pain -Patient has an infectious disease appointment on 08/29/2023 with Dr. Thedore Mins at 10 AM -IR recommending continuing current treatment and drain irrigation and recommending follow-up CT 1 drainage output is minimal or if WBC abruptly increases and recommends that she will need drain irrigation prior to removal -WOC nurse changed her wound VAC today -Surgery has signed off the patient's case now and home health and wound VAC are being arranged given that she is approaching surgical readiness for discharge   Electrolyte Abnormalities including Hypokalemia Hyponatremia Hypophosphatemia Hypercalcemia, resolved Recent Labs  Lab 08/04/23 0009 08/05/23 0253 08/06/23 0231 08/07/23 0227 08/08/23 0346 08/09/23 0301 08/10/23 0346  NA 128* 130* 127* 134* 132* 133* 132*  K 3.7 3.3* 3.4* 3.9 3.8 3.9 3.8  MG 2.3 2.5* 2.4 2.2 2.0 2.0 2.0  PHOS 2.8 3.7 2.5  --   --   --  4.0  CALCIUM 9.4 9.5 9.2 9.4 9.3 9.5 9.6  -IVF now stopped  -Repeat CMP, mag Phos in the a.m.  Essential Hypertension -On Lisinopril 10 mg p.o. nightly -Continue with IV  Hydralazine 10 mg every 4 as needed for systolic blood >180 -Continue to monitor blood pressures per protocol -Last blood pressure reading was 151/62   Anxiety and Depression -Stable and have resumed Escitalopram 20 mg p.o. daily   COPD -C/w As needed  bronchodilators with Dulera, Singulair and as needed albuterol 3 mL IH Q4 as needed wheezing and shortness of breath SpO2: 99 % O2 Flow Rate (L/min): 2 L/min -Desaturated on ambulatory O2 screen yesterday and will repeat again today and give her a dose of Lasix given volume overload -Check chest x-ray in the a.m.    Leukocytosis, worsened in the setting of Intraabdominal Abscesses  -See Above  Volume Overload -Has documented that she is 11.665 L positive -Will give her a dose of IV Lasix 40 mg today and repeat CXR in the AM -Strict I's and O's and Daily Weights;  Intake/Output Summary (Last 24 hours) at 08/10/2023 2101 Last data filed at 08/10/2023 1638 Gross per 24 hour  Intake 305 ml  Output 0 ml  Net 305 ml  -Continue to Monitor for S/Sx of Volume Overlade   Abnormal LFTs  -Elevated in setting of Abscess along the periphery of R Hepatic Lobe -AST/ALT Trend: Recent Labs  Lab 08/04/23 0009 08/05/23 0253 08/06/23 0231 08/07/23 0227 08/08/23 0346 08/09/23 0301 08/10/23 0346  AST 49* 49* 44* 41 36 34 39  ALT 81* 85* 84* 85* 81* 74* 74*  -Continue to Monitor and Trend and repeat CMP in the AM and may need to limit the amount of Acetaminophen that she is using as it is 1000 mg every 8 hours   Hyperglycemia in the setting of Prediabetes -HbA1c 6.1%.  -Continue sliding scale and Accu-Cheks -CBG trend: Recent Labs  Lab 08/08/23 0529 08/08/23 1129 08/08/23 1756 08/08/23 2036 08/09/23 0421 08/09/23 1141 08/09/23 2001  GLUCAP 147* 154* 115* 127* 103* 106* 136*   Normocytic Anemia -Hgb/Hct Trend relatively stable in mid 8 to low 9 range: Recent Labs  Lab 08/04/23 0009 08/05/23 0253 08/06/23 0231 08/07/23 0227 08/08/23 0346 08/09/23 0301 08/10/23 0346  HGB 9.2* 9.3* 9.1* 8.9* 8.6* 8.6* 8.6*  HCT 26.8* 29.1* 28.7* 28.3* 27.2* 26.7* 27.5*  MCV 94.7 98.6 98.6 99.6 100.0 97.8 99.3  -Continue to monitor for signs or symptoms of bleeding; no overt bleeding  noted -Repeat CBC in a.m.  Thrombocytosis -Likely reactive in the setting of above -Platelet Count Trend: Recent Labs  Lab 08/04/23 0009 08/05/23 0253 08/06/23 0231 08/07/23 0227 08/08/23 0346 08/09/23 0301 08/10/23 0346  PLT 457* 552* 627* 689* 746* 828* 856*  -Continue to Monitor and Trend and repeat CMP in the AM   Moderate Protein Calorie Malnutrition Nutrition Status: Nutrition Problem: Moderate Malnutrition Etiology: acute illness Signs/Symptoms: mild fat depletion, mild muscle depletion, energy intake < 75% for > 7 days Interventions: TPN  Hypoalbuminemia -Patient's Albumin Trend: Recent Labs  Lab 08/04/23 0009 08/05/23 0253 08/06/23 0231 08/07/23 0227 08/08/23 0346 08/09/23 0301 08/10/23 0346  ALBUMIN 2.2* 2.3* 2.3* 2.3* 2.2* 2.2* 2.2*  -Continue to Monitor and Trend and repeat CMP in the AM

## 2023-08-01 NOTE — Progress Notes (Signed)
Wound was 7.5cm long, 3.5cm across and 3cm deep.

## 2023-08-01 NOTE — Plan of Care (Signed)

## 2023-08-01 NOTE — Progress Notes (Signed)
Progress Note  6 Days Post-Op  Subjective: Pt resting this AM but daughter at bedside. She reports patient was uncomfortable all night more from her back than abdomen. She did have a large BM after the CT contrast yesterday but no further bowel function. Discussed CT findings. NGT remains bilious.   Objective: Vital signs in last 24 hours: Temp:  [97.9 F (36.6 C)-98.4 F (36.9 C)] 98.2 F (36.8 C) (12/11 0636) Pulse Rate:  [79-83] 79 (12/11 0636) Resp:  [16-18] 16 (12/11 0636) BP: (168-188)/(76-81) 174/77 (12/11 0636) SpO2:  [94 %-98 %] 94 % (12/11 0856) Last BM Date : 07/31/23  Intake/Output from previous day: 12/10 0701 - 12/11 0700 In: 528.8 [IV Piggyback:528.8] Out: 0  Intake/Output this shift: Total I/O In: -  Out: 600 [Emesis/NG output:600]  PE: General: pleasant, WD, WN female who is laying in bed in NAD Lungs: Respiratory effort nonlabored Abd: soft, appropriately ttp, midline VAC with good seal, NGT with bilious output   Lab Results:  Recent Labs    07/31/23 0447 08/01/23 0349  WBC 14.9* 15.5*  HGB 10.4* 9.6*  HCT 31.5* 29.2*  PLT 277 254   BMET Recent Labs    07/31/23 0447 08/01/23 0349  NA 140 132*  K 3.7 3.5  CL 104 101  CO2 26 24  GLUCOSE 123* 126*  BUN 19 17  CREATININE 0.70 0.53  CALCIUM 10.1 9.3   PT/INR No results for input(s): "LABPROT", "INR" in the last 72 hours. CMP     Component Value Date/Time   NA 132 (L) 08/01/2023 0349   K 3.5 08/01/2023 0349   CL 101 08/01/2023 0349   CO2 24 08/01/2023 0349   GLUCOSE 126 (H) 08/01/2023 0349   BUN 17 08/01/2023 0349   CREATININE 0.53 08/01/2023 0349   CALCIUM 9.3 08/01/2023 0349   PROT 5.6 (L) 07/29/2023 0750   ALBUMIN 2.6 (L) 07/29/2023 0750   AST 16 07/29/2023 0750   ALT 18 07/29/2023 0750   ALKPHOS 41 07/29/2023 0750   BILITOT 0.7 07/29/2023 0750   GFRNONAA >60 08/01/2023 0349   GFRAA >60 01/06/2018 1315   Lipase     Component Value Date/Time   LIPASE 25 07/26/2023  1358       Studies/Results: CT ABDOMEN PELVIS W CONTRAST  Result Date: 07/31/2023 CLINICAL DATA:  Abdominal pain, post-op. Patient underwent abdominal laparoscopy and partial small bowel resection for perforated closed loop bowel obstruction and jejunal ischemia on 07/26/2023. EXAM: CT ABDOMEN AND PELVIS WITH CONTRAST TECHNIQUE: Multidetector CT imaging of the abdomen and pelvis was performed using the standard protocol following bolus administration of intravenous contrast. RADIATION DOSE REDUCTION: This exam was performed according to the departmental dose-optimization program which includes automated exposure control, adjustment of the mA and/or kV according to patient size and/or use of iterative reconstruction technique. CONTRAST:  OMNIPAQUE IOHEXOL 300 MG/ML  SOLN COMPARISON:  Abdominopelvic CT 07/26/2023 FINDINGS: Lower chest: New small bilateral pleural effusions with dependent airspace opacities at both lung bases, right greater than left, likely atelectasis. No basilar pneumothorax. Aortic and coronary artery atherosclerosis noted. Hepatobiliary: Mildly heterogeneous hepatic steatosis. No focal liver lesion or abnormal enhancement. No evidence of gallstones, gallbladder wall thickening or biliary dilatation. Pancreas: Unremarkable. No pancreatic ductal dilatation or surrounding inflammatory changes. Spleen: Normal in size without focal abnormality. Adrenals/Urinary Tract: Both adrenal glands appear normal. No evidence of urinary tract calculus, suspicious renal lesion or hydronephrosis. Minimal air within the bladder lumen, probably iatrogenic. No bladder wall thickening or  surrounding inflammation. Stomach/Bowel: Enteric tube extends into the distal stomach. Enteric contrast was administered and extends into the distal small bowel, the on the small bowel anastomosis in the right false pelvis. There is mild distension of the stomach, duodenum and proximal jejunum with scattered air-fluid  levels but no significant wall thickening. Beyond the anastomosis, the small bowel is normal in caliber but does contain contrast. No evidence of contrast leak. The colon is decompressed without evidence of acute inflammation. Scattered diverticulosis of the sigmoid colon. Vascular/Lymphatic: There are no enlarged abdominal or pelvic lymph nodes. Aortic and branch vessel atherosclerosis without evidence of aneurysm or large vessel occlusion. The portal, superior mesenteric and splenic veins are patent. Reproductive: Status post hysterectomy. No evidence of adnexal mass. Other: Small interloop fluid collection in the right false pelvis measuring 2.7 x 1.1 cm on image 57/2. Small to moderate amount of ascites has mildly increased in volume compared with the previous CT. Most of the fluid is around the liver and in the pelvis. There is mild adjacent thin peritoneal enhancement without organized fluid collection to suggest an abscess. No evidence of pneumoperitoneum or contrast extravasation. Postsurgical changes in the anterior abdominal wall. Musculoskeletal: No acute or significant osseous findings. Stable multilevel spondylosis associated with a convex right scoliosis. IMPRESSION: 1. Mild distension of the stomach, duodenum and proximal jejunum with scattered air-fluid levels, likely postoperative ileus. No evidence of contrast leak. 2. Small to moderate amount of ascites has mildly increased in volume compared with the previous CT. There is mild adjacent thin peritoneal enhancement without organized fluid collection to suggest an abscess. 3. New small bilateral pleural effusions with dependent airspace opacities at both lung bases, likely atelectasis. 4. No acute vascular findings. 5.  Aortic Atherosclerosis (ICD10-I70.0). Electronically Signed   By: Carey Bullocks M.D.   On: 07/31/2023 16:28   [place] EKG SITE RITE  Result Date: 07/31/2023 If Site Rite image not attached, placement could not be confirmed due to  current cardiac rhythm.  DG Abd Portable 1V  Result Date: 07/30/2023 CLINICAL DATA:  Nasogastric tube placement. EXAM: PORTABLE ABDOMEN - 1 VIEW COMPARISON:  07/30/2023 at 1104 hours. FINDINGS: Nasogastric tube terminates in the gastric antrum. Persistent gaseous distention of small bowel in the mid abdomen. IMPRESSION: 1. Satisfactory nasogastric tube placement. 2. Persistent small bowel obstruction. Electronically Signed   By: Leanna Battles M.D.   On: 07/30/2023 15:00   DG Abd Portable 1V  Result Date: 07/30/2023 CLINICAL DATA:  Nausea and vomiting. EXAM: PORTABLE ABDOMEN - 1 VIEW COMPARISON:  CT abdomen pelvis 07/26/2023. FINDINGS: Present gaseous distention of small bowel. No colonic gas. Air and fluid in the stomach. No unexpected radiopaque calculi. Dextroconvex scoliosis of the lumbar spine with multilevel degenerative changes. IMPRESSION: Persistent small bowel obstruction. Electronically Signed   By: Leanna Battles M.D.   On: 07/30/2023 14:59    Anti-infectives: Anti-infectives (From admission, onward)    Start     Dose/Rate Route Frequency Ordered Stop   07/28/23 1330  piperacillin-tazobactam (ZOSYN) IVPB 3.375 g        3.375 g 12.5 mL/hr over 240 Minutes Intravenous Every 8 hours 07/28/23 1232     07/26/23 1745  piperacillin-tazobactam (ZOSYN) IVPB 3.375 g        3.375 g 12.5 mL/hr over 240 Minutes Intravenous  Once 07/26/23 1737 07/26/23 1931        Assessment/Plan  Closed loop SBO with ischemic small bowel and perforation  POD6 s/p laparoscopy converted to open small bowel resection  Dr. Maisie Fus - post-op ileus  - WBC 15 from 14, afebrile - VAC M/W/F - CT AP yesterday with mild ileus, no contrast extravasation from GI tract, small to moderate volume ascites without organized collection to suggest abscess - would keep NGT to LIWS today but can consider clamping tomorrow if more bowel function  - continue TPN, ok to have 8 oz tea or popsicle 1-2x per day for comfort with  NGT to LIWS - consider repeat scan in a few days if not improving or having more bowel function  - continue to mobilize as tolerated - Keep K >4.0 and Mg >2.0  Severe protein calorie malnutrition - PICC and TPN,  prealbumin 5 12/10  FEN: NPO, PICC/TPN, NGT to LIWS VTE: LMWH ID: [id number] 12/5>>  LOS: 6 days     Juliet Rude, [facility] 08/01/2023, 10:08 AM Please see Amion for pager number during day hours 7:00am-4:30pm

## 2023-08-01 NOTE — Progress Notes (Signed)
PROGRESS NOTE    [name]  WUJ:[id number] DOB: 05/18/1951 DOA: 07/26/2023 PCP: Alberteen Sam, FNP   Brief Narrative:  Laura Bradshaw is a 72 y.o. female with PMH of HTN, anxiety, depression, COPD, HSV, low back pain and mild dementia presenting with acute abdominal pain and vomiting. She was found to have closed-loop bowel obstruction and ischemia, taken to the OR for laparoscopic, converted to open, small bowel resection with intraoperative findings of perforated closed loop obstruction with ischemic jejunum.  She subsequently underwent resection on 07/26/2023 by Dr. Maisie Fus and currently NG tube remains in place.  Abdominal CT done and below.  WBC continue to remain elevated so we will continue IV Zosyn.  Per surgery if she does well we will consider clamping trial tomorrow but if she continues to still have persistent leukocytosis and not improving next few days we will repeat a CT scan.  Assessment and Plan:  Small bowel obstruction, perforation, jejunal ischemia s/p Resection and now with Ileus  -s/p resection 12/5 by Dr. Maisie Fus.  -Continue NGT to [facility] and per general surgery if she does well there can to consider clamping trial tomorrow as her NG tube output remains bilious -Abd CT to r/o abscess per surgery is as below and she had a large bowel movement after the CT contrast yesterday but no further bowel function currently -Wound vac per surgery -Surg path pending. - Pain control as ordered. Limit narcotics as able but will change IV hydromorphone to IV morphine given that she tolerates this better -CT suggests ongoing ileus, and is now on TPN per general surgery -Per general surgery they are recommending repeating a CT scan if she is not improving or not having more bowel function in next few days -PT/OT recommending Home Health PT/OT    Hypokalemia -Replace by IV, will add to MIVF as well as she's NPO with GI losses -Patient's K+ Level Trend: Recent Labs  Lab  07/26/23 1408 07/27/23 0253 07/28/23 0523 07/29/23 0750 07/30/23 0500 07/31/23 0447 08/01/23 0349  K 3.9 3.4* 4.2 3.7 2.6* 3.7 3.5  -Replete with TPN -Continue to Monitor and Replete as Necessary -Repeat CMP in the AM  .  HTN: -Stable blood pressures have been remaining elevated. -Was going to resume her lisinopril but she remains strict n.p.o. given general surgery recommendations and will add IV hydralazine 10 mg every 6 as needed for SBP greater than 160 or DBP greater than 100 -Continue to monitor blood pressures per protocol -Last blood pressure reading was elevated at 178/81 it could be in the setting of pain   Anxiety and Depression -Stable -Resume home meds when able to absorb anything po but remains Strict NPO for now.    COPD -Continue controller med and prn albuterol neb.  -Decreased rate of IVF given small pleural effusions -Encourage IS (at bedside) SpO2: 100 % O2 Flow Rate (L/min): 2 L/min -Repeat CXR in the AM    Mild Hypercalcemia -Likely due to dehydration, resolved. -Ca2+ Trend: Recent Labs  Lab 07/26/23 1358 07/27/23 0253 07/28/23 0523 07/29/23 0750 07/30/23 0500 07/31/23 0447 08/01/23 0349  CALCIUM 10.5* 8.7* 8.8* 9.8 10.5* 10.1 9.3  -Continue to Monitor and Trend and repeat CBC in the MAM   Leukocytosis -Initially Resolved, then recurrent postoperatively.  -WBC Trend: Recent Labs  Lab 07/26/23 1358 07/27/23 0253 07/28/23 0523 07/29/23 0750 07/30/23 0500 07/31/23 0447 08/01/23 0349  WBC 16.1* 3.6* 15.4* 15.4* 17.2* 14.9* 15.5*  -Restarted IV Zosyn on 12/7.  -Blood cultures  negative from 12/5. No operative culture sent. -Blood cultures are NGTD -Repeat CT Abd/Pelvis with Contrast 07/31/23 done and showed "Mild distension of the stomach, duodenum and proximal jejunum with scattered air-fluid levels, likely postoperative ileus. No evidence of contrast leak. Small to moderate amount of ascites has mildly increased in volume compared with  the previous CT. There is mild adjacent thin peritoneal enhancement without organized fluid collection to suggest an abscess. New small bilateral pleural effusions with dependent airspace opacities at both lung bases, likely atelectasis. No acute vascular findings. Aortic Atherosclerosis/"   Hyperglycemia in the setting of Prediabetes -HbA1c 6.1%.  -C/w Sensitive Novolog SSI q6h -CBG Trend: Recent Labs  Lab 07/26/23 1838 07/26/23 2023 07/31/23 1753 07/31/23 2358 08/01/23 0633 08/01/23 1229  GLUCAP 191* 156* 69* 115* 131* 123*  -Outpatient follow up recommended.   Hypophosphatemia -Phos Level Trend: Recent Labs  Lab 07/27/23 0253 08/01/23 0349  PHOS 3.1 2.4*  -Replete with IV NaPhos 15 mmol -Continue to Monitor and Replete as Necessary -Check Phos Level in the AM  Normocytic Anemia -Hgb/Hct Trend: Recent Labs  Lab 07/26/23 1408 07/27/23 0253 07/28/23 0523 07/29/23 0750 07/30/23 0500 07/31/23 0447 08/01/23 0349  HGB 16.7* 14.0 11.4* 10.5* 11.2* 10.4* 9.6*  HCT 49.0* 42.6 36.1 33.3* 34.6* 31.5* 29.2*  MCV  --  97.9 100.3* 99.4 97.2 96.3 96.1  -Check Anemia Panel in the AM -Continue to Monitor for S/Sx of Bleeding; no overt Bleeding noted -Repeat CBC in the AM   Moderate Protein Calorie Malnutrition Nutrition Status: Nutrition Problem: Moderate Malnutrition Etiology: acute illness Signs/Symptoms: mild fat depletion, mild muscle depletion, energy intake < 75% for > 7 days Interventions: TPN -Prealbumin was 5  DVT prophylaxis: enoxaparin (LOVENOX) injection 30 mg Start: 07/27/23 0800 SCD's Start: 07/26/23 2150 SCDs Start: 07/26/23 2137    Code Status: Full Code Family Communication: Discussed with Daughter at bedside   Disposition Plan:  Level of care: Telemetry Status is: Inpatient Remains inpatient appropriate because: His further clinical improvement and clearance by surgery   Consultants:  General surgery  Procedures:  LAPAROSCOPY DIAGNOSTIC  CONVERTED TO OPEN SMALL BOWEL RESECTION  Antimicrobials:  Anti-infectives (From admission, onward)    Start     Dose/Rate Route Frequency Ordered Stop   07/28/23 1330  piperacillin-tazobactam (ZOSYN) IVPB 3.375 g        3.375 g 12.5 mL/hr over 240 Minutes Intravenous Every 8 hours 07/28/23 1232     07/26/23 1745  piperacillin-tazobactam (ZOSYN) IVPB 3.375 g        3.375 g 12.5 mL/hr over 240 Minutes Intravenous  Once 07/26/23 1737 07/26/23 1931       Subjective: Seen and examined at bedside and she was feeling uncomfortable this morning and complained of some abdominal discomfort.  No nausea or vomiting.  Still having quite a bit of output from her NG tube.  Had a very large bowel movement after her CT scan from the contrast but has not had a bowel movement since then.  No other concerns or complaints at this time.  Objective: Vitals:   07/31/23 2200 08/01/23 0636 08/01/23 0856 08/01/23 1240  BP: (!) 168/81 (!) 174/77  (!) 178/81  Pulse:  79  81  Resp:  16  16  Temp:  98.2 F (36.8 C)  97.9 F (36.6 C)  TempSrc:  Oral  Oral  SpO2:  96% 94% 100%  Weight:      Height:        Intake/Output Summary (Last 24 hours) at  08/01/2023 1532 Last data filed at 08/01/2023 0701 Gross per 24 hour  Intake 411.47 ml  Output 600 ml  Net -188.53 ml   Filed Weights   07/26/23 1342  Weight: 67.6 kg   Examination: Physical Exam:  Constitutional: WN/WD overweight chronically ill-appearing Caucasian female who appears slightly uncomfortable Respiratory: Diminished to auscultation bilaterally, no wheezing, rales, rhonchi or crackles. Normal respiratory effort and patient is not tachypenic. No accessory muscle use.  Wearing supplemental oxygen via nasal cannula Cardiovascular: RRR, no murmurs / rubs / gallops. S1 and S2 auscultated. No extremity edema.  Abdomen: Soft, non-tender, distended secondary to body habitus. bowel sounds positive.  GU: Deferred. Musculoskeletal: No clubbing /  cyanosis of digits/nails. No joint deformity upper and lower extremities.  Has a right arm PICC Skin: No rashes, lesions, ulcers on limited skin evaluation. No induration; Warm and dry.  Neurologic: CN 2-12 grossly intact with no focal deficits. Romberg sign and cerebellar reflexes not assessed.  Psychiatric: Normal judgment and insight. Alert and oriented x 3. Normal mood and appropriate affect.   Data Reviewed: I have personally reviewed following labs and imaging studies  CBC: Recent Labs  Lab 07/26/23 1358 07/26/23 1408 07/28/23 0523 07/29/23 0750 07/30/23 0500 07/31/23 0447 08/01/23 0349  WBC 16.1*   < > 15.4* 15.4* 17.2* 14.9* 15.5*  NEUTROABS 13.7*  --   --   --   --   --   --   HGB 15.4*   < > 11.4* 10.5* 11.2* 10.4* 9.6*  HCT 46.9*   < > 36.1 33.3* 34.6* 31.5* 29.2*  MCV 96.3   < > 100.3* 99.4 97.2 96.3 96.1  PLT 412*   < > 296 283 305 277 254   < > = values in this interval not displayed.   Basic Metabolic Panel: Recent Labs  Lab 07/27/23 0253 07/28/23 0523 07/29/23 0750 07/30/23 0500 07/31/23 0447 08/01/23 0349  NA 135 136 141 138 140 132*  K 3.4* 4.2 3.7 2.6* 3.7 3.5  CL 106 106 109 103 104 101  CO2 20* 23 23 24 26 24   GLUCOSE 135* 151* 120* 137* 123* 126*  BUN 17 23 18 15 19 17   CREATININE 0.81 0.76 0.71 0.61 0.70 0.53  CALCIUM 8.7* 8.8* 9.8 10.5* 10.1 9.3  MG 1.8  --   --   --   --  2.2  PHOS 3.1  --   --   --   --  2.4*   GFR: Estimated Creatinine Clearance: 55.1 mL/min (by C-G formula based on SCr of 0.53 mg/dL). Liver Function Tests: Recent Labs  Lab 07/26/23 1358 07/27/23 0253 07/29/23 0750  AST 31  --  16  ALT 41  --  18  ALKPHOS 70  --  41  BILITOT 0.7  --  0.7  PROT 7.6  --  5.6*  ALBUMIN 4.5 3.7 2.6*   Recent Labs  Lab 07/26/23 1358  LIPASE 25   No results for input(s): "AMMONIA" in the last 168 hours. Coagulation Profile: No results for input(s): "INR", "PROTIME" in the last 168 hours. Cardiac Enzymes: No results for  input(s): "CKTOTAL", "CKMB", "CKMBINDEX", "TROPONINI" in the last 168 hours. BNP (last 3 results) No results for input(s): "PROBNP" in the last 8760 hours. HbA1C: No results for input(s): "HGBA1C" in the last 72 hours. CBG: Recent Labs  Lab 07/26/23 2023 07/31/23 1753 07/31/23 2358 08/01/23 0633 08/01/23 1229  GLUCAP 156* 69* 115* 131* 123*   Lipid Profile: No results for input(s): "  CHOL", "HDL", "LDLCALC", "TRIG", "CHOLHDL", "LDLDIRECT" in the last 72 hours. Thyroid Function Tests: No results for input(s): "TSH", "T4TOTAL", "FREET4", "T3FREE", "THYROIDAB" in the last 72 hours. Anemia Panel: No results for input(s): "VITAMINB12", "FOLATE", "FERRITIN", "TIBC", "IRON", "RETICCTPCT" in the last 72 hours. Sepsis Labs: Recent Labs  Lab 07/26/23 2116 07/27/23 0253  LATICACIDVEN 3.7* 2.1*   Recent Results (from the past 240 hour(s))  Blood culture (routine x 2)     Status: None   Collection Time: 07/26/23  9:16 PM   Specimen: BLOOD  Result Value Ref Range Status   Specimen Description   Final    BLOOD BLOOD RIGHT HAND Performed at [facility], [address] [place], [place] [address]    Special Requests   Final    BOTTLES DRAWN AEROBIC AND ANAEROBIC Blood Culture results may not be optimal due to an inadequate volume of blood received in culture bottles Performed at [facility], [address], [place], [place] [address]    Culture   Final    NO GROWTH 5 DAYS Performed at [facility], [address], [place], [place] [address]    Report Status 07/31/2023 FINAL  Final  Blood culture (routine x 2)     Status: None   Collection Time: 07/26/23  9:16 PM   Specimen: BLOOD  Result Value Ref Range Status   Specimen Description   Final    BLOOD RIGHT ANTECUBITAL Performed at [facility], [address], [place], [place] [address]    Special Requests   Final    BOTTLES DRAWN AEROBIC AND ANAEROBIC Blood Culture  adequate volume Performed at [facility], [address], [place], [place] [address]    Culture   Final    NO GROWTH 5 DAYS Performed at [facility], [address] [place], [place] [address]    Report Status 07/31/2023 FINAL  Final    Radiology Studies: CT ABDOMEN PELVIS W CONTRAST  Result Date: 07/31/2023 CLINICAL DATA:  Abdominal pain, post-op. Patient underwent abdominal laparoscopy and partial small bowel resection for perforated closed loop bowel obstruction and jejunal ischemia on 07/26/2023. EXAM: CT ABDOMEN AND PELVIS WITH CONTRAST TECHNIQUE: Multidetector CT imaging of the abdomen and pelvis was performed using the standard protocol following bolus administration of intravenous contrast. RADIATION DOSE REDUCTION: This exam was performed according to the departmental dose-optimization program which includes automated exposure control, adjustment of the mA and/or kV according to patient size and/or use of iterative reconstruction technique. CONTRAST:  OMNIPAQUE IOHEXOL 300 MG/ML  SOLN COMPARISON:  Abdominopelvic CT 07/26/2023 FINDINGS: Lower chest: New small bilateral pleural effusions with dependent airspace opacities at both lung bases, right greater than left, likely atelectasis. No basilar pneumothorax. Aortic and coronary artery atherosclerosis noted. Hepatobiliary: Mildly heterogeneous hepatic steatosis. No focal liver lesion or abnormal enhancement. No evidence of gallstones, gallbladder wall thickening or biliary dilatation. Pancreas: Unremarkable. No pancreatic ductal dilatation or surrounding inflammatory changes. Spleen: Normal in size without focal abnormality. Adrenals/Urinary Tract: Both adrenal glands appear normal. No evidence of urinary tract calculus, suspicious renal lesion or hydronephrosis. Minimal air within the bladder lumen, probably iatrogenic. No bladder wall thickening or surrounding inflammation. Stomach/Bowel: Enteric tube extends  into the distal stomach. Enteric contrast was administered and extends into the distal small bowel, the on the small bowel anastomosis in the right false pelvis. There is mild distension of the stomach, duodenum and proximal jejunum with scattered air-fluid levels but  no significant wall thickening. Beyond the anastomosis, the small bowel is normal in caliber but does contain contrast. No evidence of contrast leak. The colon is decompressed without evidence of acute inflammation. Scattered diverticulosis of the sigmoid colon. Vascular/Lymphatic: There are no enlarged abdominal or pelvic lymph nodes. Aortic and branch vessel atherosclerosis without evidence of aneurysm or large vessel occlusion. The portal, superior mesenteric and splenic veins are patent. Reproductive: Status post hysterectomy. No evidence of adnexal mass. Other: Small interloop fluid collection in the right false pelvis measuring 2.7 x 1.1 cm on image 57/2. Small to moderate amount of ascites has mildly increased in volume compared with the previous CT. Most of the fluid is around the liver and in the pelvis. There is mild adjacent thin peritoneal enhancement without organized fluid collection to suggest an abscess. No evidence of pneumoperitoneum or contrast extravasation. Postsurgical changes in the anterior abdominal wall. Musculoskeletal: No acute or significant osseous findings. Stable multilevel spondylosis associated with a convex right scoliosis. IMPRESSION: 1. Mild distension of the stomach, duodenum and proximal jejunum with scattered air-fluid levels, likely postoperative ileus. No evidence of contrast leak. 2. Small to moderate amount of ascites has mildly increased in volume compared with the previous CT. There is mild adjacent thin peritoneal enhancement without organized fluid collection to suggest an abscess. 3. New small bilateral pleural effusions with dependent airspace opacities at both lung bases, likely atelectasis. 4. No  acute vascular findings. 5.  Aortic Atherosclerosis (ICD10-I70.0). Electronically Signed   By: Carey Bullocks M.D.   On: 07/31/2023 16:28   [place] EKG SITE RITE  Result Date: 07/31/2023 If Site Rite image not attached, placement could not be confirmed due to current cardiac rhythm.   Scheduled Meds:  Chlorhexidine Gluconate Cloth  6 each Topical Daily   enoxaparin (LOVENOX) injection  30 mg Subcutaneous Q24H   escitalopram  20 mg Oral Daily   gabapentin  300 mg Oral TID   insulin aspart  0-9 Units Subcutaneous Q6H   lidocaine  1 patch Transdermal Q24H   lisinopril  10 mg Oral QHS   methocarbamol (ROBAXIN) injection  500 mg Intravenous QID   mometasone-formoterol  2 puff Inhalation BID   montelukast  10 mg Oral QHS   nicotine  21 mg Transdermal Daily   simvastatin  40 mg Oral Daily   sodium chloride flush  10-40 mL Intracatheter Q12H   Continuous Infusions:  acetaminophen 1,000 mg (08/01/23 1354)   dextrose 5 % and 0.45 % NaCl with KCl 40 mEq/L 1,000 mL (07/31/23 1801)   dextrose 5 % and 0.45 % NaCl with KCl 40 mEq/L     TPN (CLINIMIX-E) Adult     And   fat emul(SMOFlipid)     piperacillin-tazobactam (ZOSYN)  IV 3.375 g (08/01/23 1445)   potassium chloride     sodium PHOSPHATE IVPB (in mmol)     TPN (CLINIMIX-E) Adult 40 mL/hr at 07/31/23 1824    LOS: 6 days   Marguerita Merles, DO [facility] Available via Epic secure chat 7am-7pm After these hours, please refer to coverage provider listed on [email or web address] 08/01/2023, 3:32 PM

## 2023-08-02 DIAGNOSIS — E440 Moderate protein-calorie malnutrition: Secondary | ICD-10-CM | POA: Diagnosis not present

## 2023-08-02 DIAGNOSIS — D72829 Elevated white blood cell count, unspecified: Secondary | ICD-10-CM | POA: Diagnosis not present

## 2023-08-02 DIAGNOSIS — K56609 Unspecified intestinal obstruction, unspecified as to partial versus complete obstruction: Secondary | ICD-10-CM | POA: Diagnosis not present

## 2023-08-02 DIAGNOSIS — E876 Hypokalemia: Secondary | ICD-10-CM | POA: Diagnosis not present

## 2023-08-02 LAB — CBC WITH DIFFERENTIAL/PLATELET
Abs Immature Granulocytes: 0.89 10*3/uL — ABNORMAL HIGH (ref 0.00–0.07)
Basophils Absolute: 0.1 10*3/uL (ref 0.0–0.1)
Basophils Relative: 1 %
Eosinophils Absolute: 0.2 10*3/uL (ref 0.0–0.5)
Eosinophils Relative: 1 %
HCT: 29.3 % — ABNORMAL LOW (ref 36.0–46.0)
Hemoglobin: 9.8 g/dL — ABNORMAL LOW (ref 12.0–15.0)
Immature Granulocytes: 4 %
Lymphocytes Relative: 10 %
Lymphs Abs: 2.2 10*3/uL (ref 0.7–4.0)
MCH: 32 pg (ref 26.0–34.0)
MCHC: 33.4 g/dL (ref 30.0–36.0)
MCV: 95.8 fL (ref 80.0–100.0)
Monocytes Absolute: 1.8 10*3/uL — ABNORMAL HIGH (ref 0.1–1.0)
Monocytes Relative: 8 %
Neutro Abs: 17.6 10*3/uL — ABNORMAL HIGH (ref 1.7–7.7)
Neutrophils Relative %: 76 %
Platelets: 313 10*3/uL (ref 150–400)
RBC: 3.06 MIL/uL — ABNORMAL LOW (ref 3.87–5.11)
RDW: 13.9 % (ref 11.5–15.5)
WBC: 22.8 10*3/uL — ABNORMAL HIGH (ref 4.0–10.5)
nRBC: 0.1 % (ref 0.0–0.2)

## 2023-08-02 LAB — URINALYSIS, COMPLETE (UACMP) WITH MICROSCOPIC
Bacteria, UA: NONE SEEN
Bilirubin Urine: NEGATIVE
Glucose, UA: NEGATIVE mg/dL
Hgb urine dipstick: NEGATIVE
Ketones, ur: NEGATIVE mg/dL
Leukocytes,Ua: NEGATIVE
Nitrite: NEGATIVE
Protein, ur: 30 mg/dL — AB
Specific Gravity, Urine: 1.025 (ref 1.005–1.030)
pH: 7 (ref 5.0–8.0)

## 2023-08-02 LAB — COMPREHENSIVE METABOLIC PANEL
ALT: 55 U/L — ABNORMAL HIGH (ref 0–44)
AST: 40 U/L (ref 15–41)
Albumin: 2.2 g/dL — ABNORMAL LOW (ref 3.5–5.0)
Alkaline Phosphatase: 219 U/L — ABNORMAL HIGH (ref 38–126)
Anion gap: 8 (ref 5–15)
BUN: 16 mg/dL (ref 8–23)
CO2: 24 mmol/L (ref 22–32)
Calcium: 9.6 mg/dL (ref 8.9–10.3)
Chloride: 102 mmol/L (ref 98–111)
Creatinine, Ser: <0.3 mg/dL — ABNORMAL LOW (ref 0.44–1.00)
Glucose, Bld: 124 mg/dL — ABNORMAL HIGH (ref 70–99)
Potassium: 3.1 mmol/L — ABNORMAL LOW (ref 3.5–5.1)
Sodium: 134 mmol/L — ABNORMAL LOW (ref 135–145)
Total Bilirubin: 1.1 mg/dL (ref <1.2)
Total Protein: 5.8 g/dL — ABNORMAL LOW (ref 6.5–8.1)

## 2023-08-02 LAB — GLUCOSE, CAPILLARY
Glucose-Capillary: 121 mg/dL — ABNORMAL HIGH (ref 70–99)
Glucose-Capillary: 132 mg/dL — ABNORMAL HIGH (ref 70–99)
Glucose-Capillary: 137 mg/dL — ABNORMAL HIGH (ref 70–99)
Glucose-Capillary: 94 mg/dL (ref 70–99)

## 2023-08-02 LAB — MAGNESIUM: Magnesium: 2.1 mg/dL (ref 1.7–2.4)

## 2023-08-02 LAB — PHOSPHORUS: Phosphorus: 3.2 mg/dL (ref 2.5–4.6)

## 2023-08-02 MED ORDER — ACETAMINOPHEN 10 MG/ML IV SOLN
1000.0000 mg | Freq: Four times a day (QID) | INTRAVENOUS | Status: DC
  Administered 2023-08-02 – 2023-08-03 (×3): 1000 mg via INTRAVENOUS
  Filled 2023-08-02 (×4): qty 100

## 2023-08-02 MED ORDER — FAT EMUL FISH OIL/PLANT BASED 20% (SMOFLIPID)IV EMUL
250.0000 mL | INTRAVENOUS | Status: AC
  Administered 2023-08-02: 250 mL via INTRAVENOUS
  Filled 2023-08-02: qty 250

## 2023-08-02 MED ORDER — POTASSIUM CHLORIDE 10 MEQ/100ML IV SOLN
10.0000 meq | INTRAVENOUS | Status: AC
  Administered 2023-08-02 (×6): 10 meq via INTRAVENOUS
  Filled 2023-08-02 (×6): qty 100

## 2023-08-02 MED ORDER — TRACE MINERALS CU-MN-SE-ZN 300-55-60-3000 MCG/ML IV SOLN
INTRAVENOUS | Status: AC
  Filled 2023-08-02 (×2): qty 960

## 2023-08-02 MED ORDER — MORPHINE SULFATE (PF) 2 MG/ML IV SOLN: 1.0000 mg | INTRAVENOUS | Status: AC | PRN

## 2023-08-02 NOTE — Progress Notes (Signed)
PROGRESS NOTE    Laura Bradshaw  ZOX:[id number] DOB: Feb 16, 1951 DOA: 07/26/2023 PCP: Alberteen Sam, FNP   Brief Narrative:  Laura Bradshaw is a 72 y.o. female with PMH of HTN, anxiety, depression, COPD, HSV, low back pain and mild dementia presenting with acute abdominal pain and vomiting. She was found to have closed-loop bowel obstruction and ischemia, taken to the OR for laparoscopic, converted to open, small bowel resection with intraoperative findings of perforated closed loop obstruction with ischemic jejunum.  She subsequently underwent resection on 07/26/2023 by Dr. Maisie Fus and currently NG tube remains in place.  Abdominal CT done and below.  WBC continue to remain elevated so we will continue IV Zosyn.  Per surgery if she does well we will consider clamping trial tomorrow but if she continues to still have persistent leukocytosis and not improving next few days we will repeat a CT scan.  Assessment and Plan:  Small bowel obstruction, perforation, jejunal ischemia s/p Resection and now with Ileus  -s/p resection 12/5 by Dr. Maisie Fus and is postoperative day 7.  -NG tube is in place and now surgery is attempting clamping trial and trialing her on a clear liquid diet this am and checking residual later today and if it is lower than the regular consider removing her NG tube  -Abd CT to r/o abscess per surgery is as below and she had a large bowel movement after the CT contrast yesterday but no further bowel function currently -Wound vac per surgery -Surg path pending. -Pain control as ordered. Limit narcotics as able but will change IV hydromorphone to IV morphine given that she tolerates this better -CT suggests ongoing ileus, and is now on TPN per general surgery -CT 12/10 as below -Per general surgery they are recommending repeating a CT scan if she is not improving or not having more bowel function in next few days -PT/OT recommending Home Health PT/OT when stable for D/C    Hypokalemia -Likely to be replaced with TPN -Patient's K+ Level Trend: Recent Labs  Lab 07/27/23 0253 07/28/23 0523 07/29/23 0750 07/30/23 0500 07/31/23 0447 08/01/23 0349 08/02/23 0418  K 3.4* 4.2 3.7 2.6* 3.7 3.5 3.1*  -Replete with TPN -Continue to Monitor and Replete as Necessary -Repeat CMP in the AM   Hyponatremia -Na+ Trend: Recent Labs  Lab 07/27/23 0253 07/28/23 0523 07/29/23 0750 07/30/23 0500 07/31/23 0447 08/01/23 0349 08/02/23 0418  NA 135 136 141 138 140 132* 134*  -Continue to Monitor and Trend and Repeat CMP in the AM  .  HTN: -Stable blood pressures have been remaining elevated. -Was going to resume her lisinopril but she remains strict n.p.o. given general surgery recommendations and will add IV hydralazine 10 mg every 6 as needed for SBP greater than 160 or DBP greater than 100 -Continue to monitor blood pressures per protocol -Last blood pressure reading was elevated at 180/82 it could be in the setting of pain   Anxiety and Depression -Stable -Resume home meds when able to absorb anything po but remains Strict NPO for now.    COPD -Continue controller med and prn albuterol neb.  -Decreased rate of IVF given small pleural effusions -Encourage IS (at bedside) SpO2: 95 % O2 Flow Rate (L/min): 2 L/min -Repeat CXR in the AM    Mild Hypercalcemia -Likely due to dehydration, resolved. -Ca2+ Trend: Recent Labs  Lab 07/27/23 0253 07/28/23 0523 07/29/23 0750 07/30/23 0500 07/31/23 0447 08/01/23 0349 08/02/23 0418  CALCIUM 8.7* 8.8* 9.8 10.5* 10.1  9.3 9.6  -Continue to Monitor and Trend and repeat CBC in the MAM   Leukocytosis -Initially Resolved, then recurrent postoperatively and now worsened significantly.  -WBC Trend: Recent Labs  Lab 07/27/23 0253 07/28/23 0523 07/29/23 0750 07/30/23 0500 07/31/23 0447 08/01/23 0349 08/02/23 1120  WBC 3.6* 15.4* 15.4* 17.2* 14.9* 15.5* 22.8*  -Restarted IV Zosyn on 12/7.  -Blood cultures  negative from 12/5. No operative culture sent. -Blood cultures are NGTD at 5 Days on 07/26/23 but will repeat today given that WBC is worsened as well as a Urinalysis  -Repeat CT Abd/Pelvis with Contrast 07/31/23 done and showed "Mild distension of the stomach, duodenum and proximal jejunum with scattered air-fluid levels, likely postoperative ileus. No evidence of contrast leak. Small to moderate amount of ascites has mildly increased in volume compared with the previous CT. There is mild adjacent thin peritoneal enhancement without organized fluid collection to suggest an abscess. New small bilateral pleural effusions with dependent airspace opacities at both lung bases, likely atelectasis. No acute vascular findings. Aortic Atherosclerosis" -Continue to Monitor for S/Sx of Infection and repeat CBC in the AM and repeat CXR in the AM   Hyperglycemia in the setting of Prediabetes -HbA1c 6.1%.  -C/w Sensitive Novolog SSI q6h -CBG Trend: Recent Labs  Lab 07/31/23 2358 08/01/23 0633 08/01/23 1229 08/01/23 1805 08/02/23 0253 08/02/23 0538 08/02/23 1313  GLUCAP 115* 131* 123* 117* 121* 132* 137*  -Outpatient follow up recommended.   Hypophosphatemia -Phos Level Trend: Recent Labs  Lab 07/27/23 0253 08/01/23 0349 08/02/23 0418  PHOS 3.1 2.4* 3.2  -Replete with IV NaPhos 15 mmol yesterday -Continue to Monitor and Replete as Necessary -Check Phos Level in the AM  Normocytic Anemia -Hgb/Hct Trend: Recent Labs  Lab 07/27/23 0253 07/28/23 0523 07/29/23 0750 07/30/23 0500 07/31/23 0447 08/01/23 0349 08/02/23 1120  HGB 14.0 11.4* 10.5* 11.2* 10.4* 9.6* 9.8*  HCT 42.6 36.1 33.3* 34.6* 31.5* 29.2* 29.3*  MCV 97.9 100.3* 99.4 97.2 96.3 96.1 95.8  -Check Anemia Panel in the AM -Continue to Monitor for S/Sx of Bleeding; no overt Bleeding noted -Repeat CBC in the AM   Moderate Protein Calorie Malnutrition Nutrition Status: Nutrition Problem: Moderate Malnutrition Etiology: acute  illness Signs/Symptoms: mild fat depletion, mild muscle depletion, energy intake < 75% for > 7 days Interventions: TPN -Prealbumin was 5  Hypoalbuminemia -Patient's Albumin Trend: Recent Labs  Lab 07/26/23 1358 07/27/23 0253 07/29/23 0750 08/02/23 0418  ALBUMIN 4.5 3.7 2.6* 2.2*  -Continue to Monitor and Trend and repeat CMP in the AM   DVT prophylaxis: enoxaparin (LOVENOX) injection 30 mg Start: 07/27/23 0800 SCD's Start: 07/26/23 2150 SCDs Start: 07/26/23 2137    Code Status: Full Code Family Communication: No family present at bedside   Disposition Plan:  Level of care: Telemetry Status is: Inpatient Remains inpatient appropriate because: Needs further clinical improvement and clearance by the surgical team   Consultants:  General Surgery   Procedures:  LAPAROSCOPY DIAGNOSTIC CONVERTED TO OPEN SMALL BOWEL RESECTION   Antimicrobials:  Anti-infectives (From admission, onward)    Start     Dose/Rate Route Frequency Ordered Stop   07/28/23 1330  piperacillin-tazobactam (ZOSYN) IVPB 3.375 g        3.375 g 12.5 mL/hr over 240 Minutes Intravenous Every 8 hours 07/28/23 1232     07/26/23 1745  piperacillin-tazobactam (ZOSYN) IVPB 3.375 g        3.375 g 12.5 mL/hr over 240 Minutes Intravenous  Once 07/26/23 1737 07/26/23 1931  Subjective: Seen and examined at bedside and she was somnolent and drowsy but easily arousable.  Denies any complaints or pain.  States that she is having some gas and passing flatus.  No nausea or vomiting.  No other concerns or complaints this time and NG tube tube is clamped and undergoing current trial.  Objective: Vitals:   08/02/23 0534 08/02/23 0906 08/02/23 1312 08/02/23 1407  BP: (!) 159/68  (!) 180/82 (!) 160/72  Pulse: 81  85 79  Resp: 17  17   Temp: 98 F (36.7 C)  (!) 97.5 F (36.4 C)   TempSrc: Oral  Oral   SpO2: 97% 94% 95%   Weight:      Height:        Intake/Output Summary (Last 24 hours) at 08/02/2023 1410 Last  data filed at 08/02/2023 0400 Gross per 24 hour  Intake 1381.31 ml  Output 1185 ml  Net 196.31 ml   Filed Weights   07/26/23 1342  Weight: 67.6 kg   Examination: Physical Exam:  Constitutional: WN/WD overweight chronically ill-appearing Caucasian female who appears comfortable and resting in no acute distress Respiratory: Diminished to auscultation bilaterally with coarse breath sounds, no wheezing, rales, rhonchi or crackles. Normal respiratory effort and patient is not tachypenic. No accessory muscle use.  Unlabored breathing Cardiovascular: RRR, no murmurs / rubs / gallops. S1 and S2 auscultated. No extremity edema.  Abdomen: Soft, non-tender, distended secondary to body habitus. Bowel sounds positive.  GU: Deferred. Musculoskeletal: No clubbing / cyanosis of digits/nails. No joint deformity upper and lower extremities.  Skin: No rashes, lesions, ulcers on limited skin evaluation. No induration; Warm and dry.  Neurologic: CN 2-12 grossly intact with no focal deficits. Romberg sign and cerebellar reflexes not assessed.  Psychiatric: Normal judgment and insight. Alert and oriented x 3. Normal mood and appropriate affect.   Data Reviewed: I have personally reviewed following labs and imaging studies  CBC: Recent Labs  Lab 07/29/23 0750 07/30/23 0500 07/31/23 0447 08/01/23 0349 08/02/23 1120  WBC 15.4* 17.2* 14.9* 15.5* 22.8*  NEUTROABS  --   --   --   --  17.6*  HGB 10.5* 11.2* 10.4* 9.6* 9.8*  HCT 33.3* 34.6* 31.5* 29.2* 29.3*  MCV 99.4 97.2 96.3 96.1 95.8  PLT 283 305 277 254 313   Basic Metabolic Panel: Recent Labs  Lab 07/27/23 0253 07/28/23 0523 07/29/23 0750 07/30/23 0500 07/31/23 0447 08/01/23 0349 08/02/23 0418  NA 135   < > 141 138 140 132* 134*  K 3.4*   < > 3.7 2.6* 3.7 3.5 3.1*  CL 106   < > 109 103 104 101 102  CO2 20*   < > 23 24 26 24 24   GLUCOSE 135*   < > 120* 137* 123* 126* 124*  BUN 17   < > 18 15 19 17 16   CREATININE 0.81   < > 0.71 0.61  0.70 0.53 <0.30*  CALCIUM 8.7*   < > 9.8 10.5* 10.1 9.3 9.6  MG 1.8  --   --   --   --  2.2 2.1  PHOS 3.1  --   --   --   --  2.4* 3.2   < > = values in this interval not displayed.   GFR: CrCl cannot be calculated (This lab value cannot be used to calculate CrCl because it is not a number: <0.30). Liver Function Tests: Recent Labs  Lab 07/27/23 0253 07/29/23 0750 08/02/23 0418  AST  --  16 40  ALT  --  18 55*  ALKPHOS  --  41 219*  BILITOT  --  0.7 1.1  PROT  --  5.6* 5.8*  ALBUMIN 3.7 2.6* 2.2*   No results for input(s): "LIPASE", "AMYLASE" in the last 168 hours.  No results for input(s): "AMMONIA" in the last 168 hours. Coagulation Profile: No results for input(s): "INR", "PROTIME" in the last 168 hours. Cardiac Enzymes: No results for input(s): "CKTOTAL", "CKMB", "CKMBINDEX", "TROPONINI" in the last 168 hours. BNP (last 3 results) No results for input(s): "PROBNP" in the last 8760 hours. HbA1C: No results for input(s): "HGBA1C" in the last 72 hours. CBG: Recent Labs  Lab 08/01/23 1229 08/01/23 1805 08/02/23 0253 08/02/23 0538 08/02/23 1313  GLUCAP 123* 117* 121* 132* 137*   Lipid Profile: No results for input(s): "CHOL", "HDL", "LDLCALC", "TRIG", "CHOLHDL", "LDLDIRECT" in the last 72 hours. Thyroid Function Tests: No results for input(s): "TSH", "T4TOTAL", "FREET4", "T3FREE", "THYROIDAB" in the last 72 hours. Anemia Panel: No results for input(s): "VITAMINB12", "FOLATE", "FERRITIN", "TIBC", "IRON", "RETICCTPCT" in the last 72 hours. Sepsis Labs: Recent Labs  Lab 07/26/23 2116 07/27/23 0253  LATICACIDVEN 3.7* 2.1*    Recent Results (from the past 240 hours)  Blood culture (routine x 2)     Status: None   Collection Time: 07/26/23  9:16 PM   Specimen: BLOOD  Result Value Ref Range Status   Specimen Description   Final    BLOOD BLOOD RIGHT HAND Performed at [facility], [address], [place], [place] [address]    Special Requests    Final    BOTTLES DRAWN AEROBIC AND ANAEROBIC Blood Culture results may not be optimal due to an inadequate volume of blood received in culture bottles Performed at [facility], [address] [place], [place] [address]    Culture   Final    NO GROWTH 5 DAYS Performed at [facility], [address] [place], [place] [address]    Report Status 07/31/2023 FINAL  Final  Blood culture (routine x 2)     Status: None   Collection Time: 07/26/23  9:16 PM   Specimen: BLOOD  Result Value Ref Range Status   Specimen Description   Final    BLOOD RIGHT ANTECUBITAL Performed at [facility], [address], [place], [place] [address]    Special Requests   Final    BOTTLES DRAWN AEROBIC AND ANAEROBIC Blood Culture adequate volume Performed at [facility], [address], [place], [place] [address]    Culture   Final    NO GROWTH 5 DAYS Performed at [facility], [address], [place], [place] [address]    Report Status 07/31/2023 FINAL  Final    Radiology Studies: No results found.  Scheduled Meds:  Chlorhexidine Gluconate Cloth  6 each Topical Daily   enoxaparin (LOVENOX) injection  30 mg Subcutaneous Q24H   escitalopram  20 mg Oral Daily   gabapentin  300 mg Oral TID   insulin aspart  0-9 Units Subcutaneous Q6H   lidocaine  1 patch Transdermal Q24H   lisinopril  10 mg Oral QHS   methocarbamol (ROBAXIN) injection  500 mg Intravenous QID   mometasone-formoterol  2 puff Inhalation BID   montelukast  10 mg Oral QHS   nicotine  21 mg Transdermal Daily   simvastatin  40 mg Oral Daily   sodium chloride flush  10-40 mL Intracatheter Q12H  Continuous Infusions:  acetaminophen 1,000 mg (08/02/23 1341)   dextrose 5 % and 0.45 % NaCl with KCl 40 mEq/L 50 mL/hr at 08/02/23 0009   TPN (CLINIMIX-E) Adult     And   fat emul(SMOFlipid)     piperacillin-tazobactam (ZOSYN)  IV 3.375 g (08/02/23 0858)   potassium  chloride 10 mEq (08/02/23 1348)   TPN (CLINIMIX-E) Adult 40 mL/hr at 08/01/23 1659    LOS: 7 days   Marguerita Merles, DO [facility] Available via Epic secure chat 7am-7pm After these hours, please refer to coverage provider listed on [email or web address] 08/02/2023, 2:10 PM

## 2023-08-02 NOTE — Progress Notes (Signed)
PT Cancellation Note  Patient Details Name: PELAGIA SITZMAN MRN: [id number] DOB: 1950/09/07   Cancelled Treatment:    Reason Eval/Treat Not Completed: Fatigue/lethargy limiting ability to participate Pt sleeping on arrival.  Family requests to allow pt to rest.  Pt did work with OT earlier today and family states they hope to progress pt to tolerating PT and OT in one day.     [name] 08/02/2023, 2:51 PM Paulino Door, DPT Physical Therapist Acute Rehabilitation Services Office: [phone]

## 2023-08-02 NOTE — Progress Notes (Signed)
PHARMACY - TOTAL PARENTERAL NUTRITION CONSULT NOTE   Indication: Prolonged ileus  Patient Measurements: Height: 5\' 1"  (154.9 cm) Weight: 67.6 kg (149 lb) IBW/kg (Calculated) : 47.8 TPN AdjBW (KG): 52.7 Body mass index is 28.15 kg/m. Usual Weight:   Assessment:  Pharmacy is consulted to start TPN on 72 yo female with a prolonged ileus. Pt was taken emergently to OR on 12/5 after CT was concerning for closed loop bowel obstruction with ischemia.   Glucose / Insulin:  - HbA1c 6.1%. prediabetic  - CBGs at goal < 150 - 1 unit of insulin on SSI given Electrolytes:  - K+ 3.1, low  - Na low at 134 but improved - CorrCa is elevated at 11 - Other electrolytes are WNL  - Keep K >4.0 and Mg >2.0  for SBO Renal:  - Scr <1 - BUN 16 Hepatic:  - AST/ALT 40/55, trending up Intake / Output; MIVF:  - UOP uncharted, 1750 mL output through NG tube  - On D5 1/2NS with 40 meq KCl at 50 ml/hr GI Imaging: GI Surgeries / Procedures:  - 12/5 SBO and perforated closed loop bowel obstruction with ischemic jejunum   Central access: 12/10 TPN start date: 12/10   Nutritional Goals:   Clinimix E 8/10 1000 mL daily will provide 80 grams of protein at 1164 Kcal with Lipids. D10 at 60 ml/hr will provide 490 kcal for a total of 1654 kcal. This will meet both the protein and calorie goals    Due to the Baxter IV fluid disruption, all adult parenteral nutrition will utilize premade Clinimix products +/- fat emulsion infusion. Our goal will be to continue providing as close to 100% of our patient's nutritional needs, but due to limited TPN Clinimix concentrations we will meet at least 80% of protein and 75% of kcal goals.    RD Assessment:   Estimated Needs Total Energy Estimated Needs: 1600-1800 Total Protein Estimated Needs: 80-90g Total Fluid Estimated Needs: 1.8L/day  Current Nutrition:  NPO  Plan:  NOW: Potassium chloride 10 meq IV x 6  At 1800: Continue  Clinimix E 8/10 at 40 mL/hr and  lipids at 1800 Will not add D10 orders yet until electrolytes have stabilized.  Electrolytes in TPN (per liter, non-adjustable):  Na - 35 mEq/L K - 30 mEq/L Ca - 5 mEq/L Mg - 5 mEq/L Phos - 15 mmol/L Cl:Ac ratio - ~1:1 Add standard MVI and trace elements to TPN Continue Sensitive q6h SSI and adjust as needed  Continue MIVF to 50 mL/hr at 1800 Monitor TPN labs on Mon/Thurs    Hessie Knows, PharmD, BCPS Secure Chat if ?s 08/02/2023 8:47 AM

## 2023-08-02 NOTE — Evaluation (Signed)
Occupational Therapy Evaluation Patient Details Name: Laura Bradshaw MRN: [id number] DOB: 1950-11-14 Today's Date: 08/02/2023   History of Present Illness Pt is 72 yo female admitted on 07/26/23 with SBO, perforation, jejunal ischemia and is s/p open small bowel resection on 07/26/23.  Midline wound open - possible wound vac placement Monday, 12/9.  Pt with hx including but not limited to HTN, anxiety, depression, COPD, HSV, low back pain, mild dementia.   Clinical Impression   PTA, pt lives alone and typically completely independent in all ADLs, IADLs and mobility without AD. Pt presents now with deficits in cardiopulmonary endurance, dynamic standing balance, strength and abdominal discomfort. Overall, pt requires Mod A for bed mobility and Min A for transfers using RW. Pt requires Min A for UB ADL and Mod A for LB ADLs. Pt politely declined further OOB activity due to fatigue. Pt's daughter present, hands on to assist and reports adequate family support at DC. Initiated education re: energy conservation, endurance retraining and flexibility therapeutic activities with plans to further address. Rec HHOT at DC.      If plan is discharge home, recommend the following: A little help with walking and/or transfers;A lot of help with bathing/dressing/bathroom    Functional Status Assessment  Patient has had a recent decline in their functional status and demonstrates the ability to make significant improvements in function in a reasonable and predictable amount of time.  Equipment Recommendations  None recommended by OT    Recommendations for Other Services       Precautions / Restrictions Precautions Precautions: Fall Precaution Comments: abdominal wound vac, NGT Restrictions Weight Bearing Restrictions Per Provider Order: No      Mobility Bed Mobility Overal bed mobility: Needs Assistance Bed Mobility: Rolling, Sidelying to Sit, Sit to Supine Rolling: Mod assist Sidelying to sit:  Mod assist, HOB elevated, Used rails   Sit to supine: Min assist   General bed mobility comments: cued for log rolling to minimize twisting of abdomen with use of bed pad to assist with rolling, pt able to bring LE to EOB with assist to lift trunk. Min A for BLE back to bed    Transfers Overall transfer level: Needs assistance Equipment used: Rolling walker (2 wheels) Transfers: Sit to/from Stand Sit to Stand: Contact guard assist           General transfer comment: cues for trial of pushing from bed and one hand on RW with CGA to stand. able to take side steps at bedside with RW; politely declined OOB to chair or hallway mobility due to fatigue      Balance Overall balance assessment: Needs assistance Sitting-balance support: No upper extremity supported Sitting balance-Leahy Scale: Fair     Standing balance support: Bilateral upper extremity supported Standing balance-Leahy Scale: Poor                             ADL either performed or assessed with clinical judgement   ADL Overall ADL's : Needs assistance/impaired     Grooming: Set up;Sitting   Upper Body Bathing: Minimal assistance;Sitting   Lower Body Bathing: Moderate assistance;Sit to/from stand;Sitting/lateral leans   Upper Body Dressing : Minimal assistance;Sitting   Lower Body Dressing: Moderate assistance;Sitting/lateral leans;Sit to/from stand Lower Body Dressing Details (indicate cue type and reason): discussed figure four position, potential area for AE education and encouraged figure four attempts to gradually improve flexibility. anticipated excessive truncal flexion likely uncomfortable Toilet Transfer: Minimal  assistance;Stand-pivot;BSC/3in1;Rolling walker (2 wheels)   Toileting- Clothing Manipulation and Hygiene: Moderate assistance;Sitting/lateral lean;Sit to/from stand               Vision Baseline Vision/History: 1 Wears glasses Ability to See in Adequate Light: 0  Adequate Patient Visual Report: No change from baseline Vision Assessment?: No apparent visual deficits     Perception         Praxis         Pertinent Vitals/Pain Pain Assessment Pain Assessment: Faces Faces Pain Scale: Hurts a little bit Pain Location: abdomen Pain Descriptors / Indicators: Grimacing, Guarding Pain Intervention(s): Monitored during session, Limited activity within patient's tolerance     Extremity/Trunk Assessment Upper Extremity Assessment Upper Extremity Assessment: Generalized weakness;Right hand dominant   Lower Extremity Assessment Lower Extremity Assessment: Defer to PT evaluation   Cervical / Trunk Assessment Cervical / Trunk Assessment: Normal   Communication Communication Communication: Hearing impairment Cueing Techniques: Verbal cues;Gestural cues   Cognition Arousal: Alert Behavior During Therapy: WFL for tasks assessed/performed Overall Cognitive Status: Within Functional Limits for tasks assessed                                       General Comments  Daughter at bedside    Exercises     Shoulder Instructions      Home Living Family/patient expects to be discharged to:: Private residence Living Arrangements: Alone Available Help at Discharge: Family;Available 24 hours/day Type of Home: House Home Access: Stairs to enter [employer] of Steps: 2 Entrance Stairs-Rails: Right;Left Home Layout: One level     Bathroom Shower/Tub: Producer, television/film/video: Handicapped height Bathroom Accessibility: Yes   Home Equipment: Agricultural consultant (2 wheels);Cane - single point;Shower seat   Additional Comments: plans to go home with daughter who works from home      Prior Functioning/Environment Prior Level of Function : Independent/Modified Independent;Driving             Mobility Comments: no AD for mobility ADLs Comments: independent with ADLs and IADLs        OT Problem List:  Decreased strength;Decreased activity tolerance;Impaired balance (sitting and/or standing);Decreased knowledge of use of DME or AE;Decreased knowledge of precautions;Cardiopulmonary status limiting activity;Pain      OT Treatment/Interventions: Self-care/ADL training;Therapeutic exercise;Energy conservation;DME and/or AE instruction;Therapeutic activities;Balance training;Patient/family education    OT Goals(Current goals can be found in the care plan section) Acute Rehab OT Goals Patient Stated Goal: not be so tired, be able to get better OT Goal Formulation: With patient/family Time For Goal Achievement: 08/16/23 Potential to Achieve Goals: Good  OT Frequency: Min 1X/week    Co-evaluation              AM-PAC OT "6 Clicks" Daily Activity     Outcome Measure Help from another person eating meals?: A Little Help from another person taking care of personal grooming?: A Little Help from another person toileting, which includes using toliet, bedpan, or urinal?: A Lot Help from another person bathing (including washing, rinsing, drying)?: A Lot Help from another person to put on and taking off regular upper body clothing?: A Little Help from another person to put on and taking off regular lower body clothing?: A Lot 6 Click Score: 15   End of Session Equipment Utilized During Treatment: Rolling walker (2 wheels) Nurse Communication: Mobility status  Activity Tolerance: Patient tolerated treatment well Patient  left: in bed;with call bell/phone within reach;with family/visitor present  OT Visit Diagnosis: Other abnormalities of gait and mobility (R26.89);Muscle weakness (generalized) (M62.81)                Time: 5621-3086 OT Time Calculation (min): 30 min Charges:  OT General Charges $OT Visit: 1 Visit OT Evaluation $OT Eval Moderate Complexity: 1 Mod OT Treatments $Self Care/Home Management : 8-22 mins  Bradd Canary, OTR/L Acute Rehab Services Office: [phone]   Lorre Munroe 08/02/2023, 1:35 PM

## 2023-08-02 NOTE — Plan of Care (Signed)
  Problem: Health Behavior/Discharge Planning: Goal: Ability to manage health-related needs will improve Outcome: Progressing   Problem: Clinical Measurements: Goal: Ability to maintain clinical measurements within normal limits will improve Outcome: Progressing Goal: Will remain free from infection Outcome: Progressing Goal: Diagnostic test results will improve Outcome: Progressing Goal: Respiratory complications will improve Outcome: Progressing Goal: Cardiovascular complication will be avoided Outcome: Progressing   Problem: Activity: Goal: Risk for activity intolerance will decrease Outcome: Progressing   Problem: Nutrition: Goal: Adequate nutrition will be maintained Outcome: Progressing   Problem: Pain Management: Goal: General experience of comfort will improve Outcome: Progressing

## 2023-08-02 NOTE — Progress Notes (Signed)
Progress Note  7 Days Post-Op  Subjective: Pt reports passing more gas overnight. Was able to sleep last night with IV tylenol and robaxin combo. Feeling well overall.   Objective: Vital signs in last 24 hours: Temp:  [97.9 F (36.6 C)-98.1 F (36.7 C)] 98 F (36.7 C) (12/12 0534) Pulse Rate:  [81-85] 81 (12/12 0534) Resp:  [16-18] 17 (12/12 0534) BP: (152-178)/(68-111) 159/68 (12/12 0534) SpO2:  [94 %-100 %] 94 % (12/12 0906) Last BM Date : 07/31/23  Intake/Output from previous day: 12/11 0701 - 12/12 0700 In: 1381.3 [I.V.:688.6; IV Piggyback:692.7] Out: 1785 [Emesis/NG output:1750; Drains:35] Intake/Output this shift: No intake/output data recorded.  PE: General: pleasant, WD, WN female who is laying in bed in NAD Lungs: Respiratory effort nonlabored Abd: soft, appropriately ttp, midline VAC with good seal, NGT with thin bilious output   Lab Results:  Recent Labs    07/31/23 0447 08/01/23 0349  WBC 14.9* 15.5*  HGB 10.4* 9.6*  HCT 31.5* 29.2*  PLT 277 254   BMET Recent Labs    08/01/23 0349 08/02/23 0418  NA 132* 134*  K 3.5 3.1*  CL 101 102  CO2 24 24  GLUCOSE 126* 124*  BUN 17 16  CREATININE 0.53 <0.30*  CALCIUM 9.3 9.6   PT/INR No results for input(s): "LABPROT", "INR" in the last 72 hours. CMP     Component Value Date/Time   NA 134 (L) 08/02/2023 0418   K 3.1 (L) 08/02/2023 0418   CL 102 08/02/2023 0418   CO2 24 08/02/2023 0418   GLUCOSE 124 (H) 08/02/2023 0418   BUN 16 08/02/2023 0418   CREATININE <0.30 (L) 08/02/2023 0418   CALCIUM 9.6 08/02/2023 0418   PROT 5.8 (L) 08/02/2023 0418   ALBUMIN 2.2 (L) 08/02/2023 0418   AST 40 08/02/2023 0418   ALT 55 (H) 08/02/2023 0418   ALKPHOS 219 (H) 08/02/2023 0418   BILITOT 1.1 08/02/2023 0418   GFRNONAA NOT CALCULATED 08/02/2023 0418   GFRAA >60 01/06/2018 1315   Lipase     Component Value Date/Time   LIPASE 25 07/26/2023 1358       Studies/Results: CT ABDOMEN PELVIS W  CONTRAST Result Date: 07/31/2023 CLINICAL DATA:  Abdominal pain, post-op. Patient underwent abdominal laparoscopy and partial small bowel resection for perforated closed loop bowel obstruction and jejunal ischemia on 07/26/2023. EXAM: CT ABDOMEN AND PELVIS WITH CONTRAST TECHNIQUE: Multidetector CT imaging of the abdomen and pelvis was performed using the standard protocol following bolus administration of intravenous contrast. RADIATION DOSE REDUCTION: This exam was performed according to the departmental dose-optimization program which includes automated exposure control, adjustment of the mA and/or kV according to patient size and/or use of iterative reconstruction technique. CONTRAST:  OMNIPAQUE IOHEXOL 300 MG/ML  SOLN COMPARISON:  Abdominopelvic CT 07/26/2023 FINDINGS: Lower chest: New small bilateral pleural effusions with dependent airspace opacities at both lung bases, right greater than left, likely atelectasis. No basilar pneumothorax. Aortic and coronary artery atherosclerosis noted. Hepatobiliary: Mildly heterogeneous hepatic steatosis. No focal liver lesion or abnormal enhancement. No evidence of gallstones, gallbladder wall thickening or biliary dilatation. Pancreas: Unremarkable. No pancreatic ductal dilatation or surrounding inflammatory changes. Spleen: Normal in size without focal abnormality. Adrenals/Urinary Tract: Both adrenal glands appear normal. No evidence of urinary tract calculus, suspicious renal lesion or hydronephrosis. Minimal air within the bladder lumen, probably iatrogenic. No bladder wall thickening or surrounding inflammation. Stomach/Bowel: Enteric tube extends into the distal stomach. Enteric contrast was administered and extends into the distal  small bowel, the on the small bowel anastomosis in the right false pelvis. There is mild distension of the stomach, duodenum and proximal jejunum with scattered air-fluid levels but no significant wall thickening. Beyond the  anastomosis, the small bowel is normal in caliber but does contain contrast. No evidence of contrast leak. The colon is decompressed without evidence of acute inflammation. Scattered diverticulosis of the sigmoid colon. Vascular/Lymphatic: There are no enlarged abdominal or pelvic lymph nodes. Aortic and branch vessel atherosclerosis without evidence of aneurysm or large vessel occlusion. The portal, superior mesenteric and splenic veins are patent. Reproductive: Status post hysterectomy. No evidence of adnexal mass. Other: Small interloop fluid collection in the right false pelvis measuring 2.7 x 1.1 cm on image 57/2. Small to moderate amount of ascites has mildly increased in volume compared with the previous CT. Most of the fluid is around the liver and in the pelvis. There is mild adjacent thin peritoneal enhancement without organized fluid collection to suggest an abscess. No evidence of pneumoperitoneum or contrast extravasation. Postsurgical changes in the anterior abdominal wall. Musculoskeletal: No acute or significant osseous findings. Stable multilevel spondylosis associated with a convex right scoliosis. IMPRESSION: 1. Mild distension of the stomach, duodenum and proximal jejunum with scattered air-fluid levels, likely postoperative ileus. No evidence of contrast leak. 2. Small to moderate amount of ascites has mildly increased in volume compared with the previous CT. There is mild adjacent thin peritoneal enhancement without organized fluid collection to suggest an abscess. 3. New small bilateral pleural effusions with dependent airspace opacities at both lung bases, likely atelectasis. 4. No acute vascular findings. 5.  Aortic Atherosclerosis (ICD10-I70.0). Electronically Signed   By: Carey Bullocks M.D.   On: 07/31/2023 16:28    Anti-infectives: Anti-infectives (From admission, onward)    Start     Dose/Rate Route Frequency Ordered Stop   07/28/23 1330  piperacillin-tazobactam (ZOSYN) IVPB  3.375 g        3.375 g 12.5 mL/hr over 240 Minutes Intravenous Every 8 hours 07/28/23 1232     07/26/23 1745  piperacillin-tazobactam (ZOSYN) IVPB 3.375 g        3.375 g 12.5 mL/hr over 240 Minutes Intravenous  Once 07/26/23 1737 07/26/23 1931        Assessment/Plan  Closed loop SBO with ischemic small bowel and perforation  POD7 s/p laparoscopy converted to open small bowel resection Dr. Maisie Fus - post-op ileus  - WBC not checked this AM - ok to  repeat tomorrow AM, afebrile - VAC M/W/F - CT AP 12/10 with mild ileus, no contrast extravasation from GI tract, small to moderate volume ascites without organized collection to suggest abscess - ok to clamp NGT and trial CLD this AM and will check residual later today - if low then will consider NGT removal  - consider repeat scan in a few days if not improving or having more bowel function  - continue to mobilize as tolerated - Keep K >4.0 and Mg >2.0  Severe protein calorie malnutrition - PICC and TPN,  prealbumin 5 12/10  FEN: NPO, PICC/TPN, NGT to LIWS VTE: LMWH ID: Zosyn 12/5>>  LOS: 7 days     Juliet Rude, [facility] 08/02/2023, 10:27 AM Please see Amion for pager number during day hours 7:00am-4:30pm

## 2023-08-02 NOTE — Plan of Care (Signed)

## 2023-08-03 ENCOUNTER — Inpatient Hospital Stay (HOSPITAL_COMMUNITY): Payer: Medicare PPO

## 2023-08-03 DIAGNOSIS — K56609 Unspecified intestinal obstruction, unspecified as to partial versus complete obstruction: Secondary | ICD-10-CM | POA: Diagnosis not present

## 2023-08-03 DIAGNOSIS — D72829 Elevated white blood cell count, unspecified: Secondary | ICD-10-CM | POA: Diagnosis not present

## 2023-08-03 DIAGNOSIS — E440 Moderate protein-calorie malnutrition: Secondary | ICD-10-CM | POA: Diagnosis not present

## 2023-08-03 DIAGNOSIS — E876 Hypokalemia: Secondary | ICD-10-CM | POA: Diagnosis not present

## 2023-08-03 LAB — IRON AND TIBC
Iron: 37 ug/dL (ref 28–170)
Saturation Ratios: 13 % (ref 10.4–31.8)
TIBC: 287 ug/dL (ref 250–450)
UIBC: 250 ug/dL

## 2023-08-03 LAB — GLUCOSE, CAPILLARY
Glucose-Capillary: 100 mg/dL — ABNORMAL HIGH (ref 70–99)
Glucose-Capillary: 106 mg/dL — ABNORMAL HIGH (ref 70–99)
Glucose-Capillary: 119 mg/dL — ABNORMAL HIGH (ref 70–99)
Glucose-Capillary: 121 mg/dL — ABNORMAL HIGH (ref 70–99)
Glucose-Capillary: 122 mg/dL — ABNORMAL HIGH (ref 70–99)
Glucose-Capillary: 133 mg/dL — ABNORMAL HIGH (ref 70–99)

## 2023-08-03 LAB — COMPREHENSIVE METABOLIC PANEL
ALT: 82 U/L — ABNORMAL HIGH (ref 0–44)
AST: 73 U/L — ABNORMAL HIGH (ref 15–41)
Albumin: 2.3 g/dL — ABNORMAL LOW (ref 3.5–5.0)
Alkaline Phosphatase: 244 U/L — ABNORMAL HIGH (ref 38–126)
Anion gap: 7 (ref 5–15)
BUN: 19 mg/dL (ref 8–23)
CO2: 22 mmol/L (ref 22–32)
Calcium: 9.7 mg/dL (ref 8.9–10.3)
Chloride: 100 mmol/L (ref 98–111)
Creatinine, Ser: 0.37 mg/dL — ABNORMAL LOW (ref 0.44–1.00)
GFR, Estimated: >60 mL/min (ref >60)
Glucose, Bld: 125 mg/dL — ABNORMAL HIGH (ref 70–99)
Potassium: 3.4 mmol/L — ABNORMAL LOW (ref 3.5–5.1)
Sodium: 129 mmol/L — ABNORMAL LOW (ref 135–145)
Total Bilirubin: 1 mg/dL (ref <1.2)
Total Protein: 6.1 g/dL — ABNORMAL LOW (ref 6.5–8.1)

## 2023-08-03 LAB — CBC WITH DIFFERENTIAL/PLATELET
Abs Immature Granulocytes: 1.38 10*3/uL — ABNORMAL HIGH (ref 0.00–0.07)
Basophils Absolute: 0.1 10*3/uL (ref 0.0–0.1)
Basophils Relative: 0 %
Eosinophils Absolute: 0.3 10*3/uL (ref 0.0–0.5)
Eosinophils Relative: 1 %
HCT: 28.6 % — ABNORMAL LOW (ref 36.0–46.0)
Hemoglobin: 9.6 g/dL — ABNORMAL LOW (ref 12.0–15.0)
Immature Granulocytes: 5 %
Lymphocytes Relative: 7 %
Lymphs Abs: 1.9 10*3/uL (ref 0.7–4.0)
MCH: 32 pg (ref 26.0–34.0)
MCHC: 33.6 g/dL (ref 30.0–36.0)
MCV: 95.3 fL (ref 80.0–100.0)
Monocytes Absolute: 2 10*3/uL — ABNORMAL HIGH (ref 0.1–1.0)
Monocytes Relative: 8 %
Neutro Abs: 20.6 10*3/uL — ABNORMAL HIGH (ref 1.7–7.7)
Neutrophils Relative %: 79 %
Platelets: 360 10*3/uL (ref 150–400)
RBC: 3 MIL/uL — ABNORMAL LOW (ref 3.87–5.11)
RDW: 14 % (ref 11.5–15.5)
WBC: 26.2 10*3/uL — ABNORMAL HIGH (ref 4.0–10.5)
nRBC: 0.1 % (ref 0.0–0.2)

## 2023-08-03 LAB — RETICULOCYTES
Immature Retic Fract: 56.1 % — ABNORMAL HIGH (ref 2.3–15.9)
RBC.: 3.08 MIL/uL — ABNORMAL LOW (ref 3.87–5.11)
Retic Count, Absolute: 66.5 10*3/uL (ref 19.0–186.0)
Retic Ct Pct: 2.2 % (ref 0.4–3.1)

## 2023-08-03 LAB — FOLATE: Folate: 11.2 ng/mL (ref >5.9)

## 2023-08-03 LAB — FERRITIN: Ferritin: 188 ng/mL (ref 11–307)

## 2023-08-03 LAB — VITAMIN B12: Vitamin B-12: 368 pg/mL (ref 180–914)

## 2023-08-03 LAB — PHOSPHORUS: Phosphorus: 2.7 mg/dL (ref 2.5–4.6)

## 2023-08-03 LAB — MAGNESIUM: Magnesium: 2.3 mg/dL (ref 1.7–2.4)

## 2023-08-03 MED ORDER — METHOCARBAMOL 1000 MG/10ML IJ SOLN
500.0000 mg | Freq: Four times a day (QID) | INTRAMUSCULAR | Status: DC | PRN
  Administered 2023-08-03 – 2023-08-11 (×4): 500 mg via INTRAVENOUS
  Filled 2023-08-03 (×5): qty 10

## 2023-08-03 MED ORDER — POTASSIUM CHLORIDE 10 MEQ/100ML IV SOLN
10.0000 meq | INTRAVENOUS | Status: AC
  Administered 2023-08-03 (×6): 10 meq via INTRAVENOUS
  Filled 2023-08-03 (×6): qty 100

## 2023-08-03 MED ORDER — IOHEXOL 300 MG/ML  SOLN
100.0000 mL | Freq: Once | INTRAMUSCULAR | Status: AC | PRN
  Administered 2023-08-03: 100 mL via INTRAVENOUS

## 2023-08-03 MED ORDER — TRACE MINERALS CU-MN-SE-ZN 300-55-60-3000 MCG/ML IV SOLN
INTRAVENOUS | Status: AC
  Filled 2023-08-03 (×2): qty 960

## 2023-08-03 MED ORDER — KCL IN DEXTROSE-NACL 40-5-0.45 MEQ/L-%-% IV SOLN
INTRAVENOUS | Status: AC
  Filled 2023-08-03 (×2): qty 1000

## 2023-08-03 MED ORDER — ACETAMINOPHEN 10 MG/ML IV SOLN
1000.0000 mg | Freq: Four times a day (QID) | INTRAVENOUS | Status: AC
  Administered 2023-08-03 – 2023-08-04 (×4): 1000 mg via INTRAVENOUS
  Filled 2023-08-03 (×4): qty 100

## 2023-08-03 MED ORDER — FAT EMUL FISH OIL/PLANT BASED 20% (SMOFLIPID)IV EMUL
250.0000 mL | INTRAVENOUS | Status: AC
Start: 2023-08-03 — End: 2023-08-04
  Administered 2023-08-03: 250 mL via INTRAVENOUS
  Filled 2023-08-03: qty 250

## 2023-08-03 MED ORDER — IOHEXOL 300 MG/ML  SOLN
30.0000 mL | Freq: Once | INTRAMUSCULAR | Status: AC | PRN
  Administered 2023-08-03: 30 mL via ORAL

## 2023-08-03 NOTE — Plan of Care (Signed)

## 2023-08-03 NOTE — TOC Progression Note (Signed)
Transition of Care [facility]) - Progression Note    Patient Details  Name: Laura Bradshaw MRN: [id number] Date of Birth: March 18, 1951  Transition of Care [facility] CM/SW Contact  Otelia Santee, LCSW Phone Number: 08/03/2023, 11:27 AM  Clinical Narrative:    TOC continuing to follow for medical readiness. Will have wound vac and RW delivered to pt's room closer to expected discharge date.    Expected Discharge Plan: Home w Home Health Services Barriers to Discharge: Continued Medical Work up  Expected Discharge Plan and Services   Discharge Planning Services: CM Consult Post Acute Care Choice: Home Health Living arrangements for the past 2 months: Single Family Home                 DME Arranged: Vac DME Agency: [employer] Date DME Agency Contacted: 07/30/23 Time DME Agency Contacted: 1356 Representative spoke with at DME Agency: [place] HH Arranged: PT, OT, RN HH Agency: [facility] Date [facility] Agency Contacted: 07/30/23 Time HH Agency Contacted: 1356 Representative spoke with at [facility] Agency: Cindie   Social Determinants of Health (SDOH) Interventions SDOH Screenings   Food Insecurity: No Food Insecurity (07/29/2023)  Housing: Low Risk  (07/29/2023)  Transportation Needs: No Transportation Needs (07/29/2023)  Utilities: Not At Risk (07/27/2023)  Financial Resource Strain: Low Risk  (06/28/2023)   Received from [facility]  Physical Activity: Unknown (06/28/2023)   Received from [facility]  Social Connections: Somewhat Isolated (06/28/2023)   Received from [facility]  Stress: Stress Concern Present (06/28/2023)   Received from [facility]  Tobacco Use: High Risk (07/26/2023)    Readmission Risk Interventions     No data to display

## 2023-08-03 NOTE — Progress Notes (Signed)
PROGRESS NOTE    Laura Bradshaw  UJW:[id number] DOB: 1951-02-19 DOA: 07/26/2023 PCP: Alberteen Sam, FNP   Brief Narrative:  Laura Bradshaw is a 72 y.o. female with PMH of HTN, anxiety, depression, COPD, HSV, low back pain and mild dementia presenting with acute abdominal pain and vomiting. She was found to have closed-loop bowel obstruction and ischemia, taken to the OR for laparoscopic, converted to open, small bowel resection with intraoperative findings of perforated closed loop obstruction with ischemic jejunum.  She subsequently underwent resection on 07/26/2023 by Dr. Maisie Fus and currently NG tube remains in place.  Abdominal CT done and below.  WBC continue to remain elevated so we will continue IV Zosyn.    Currently she is undergoing clamping trial today since she is having more bowel movements and her CT scan is being repeated given her worsening leukocytosis.  She is afebrile but the CT scan with IV contrast is being repeated to determine if fluid noted on the CT scan is more organized.  They are recommending keeping her n.p.o. until after her CT scan is resulted and ID has been consulted for further evaluation recommendations  Assessment and Plan:  Small bowel obstruction, perforation, jejunal ischemia s/p Resection and now with Ileus  -s/p resection 12/5 by Dr. Maisie Fus and is postoperative day 7.  -NG tube is in place and now surgery is attempting clamping trial and trialing her on a clear liquid diet this am and checking residual later today and if it is lower than the regular consider removing her NG tube  -Abd CT to r/o abscess per surgery is as below and she had a large bowel movement after the CT contrast yesterday but no further bowel function currently -Wound vac per surgery -Surg path pending. -Pain control as ordered. Limit narcotics as able but will change IV hydromorphone to IV morphine given that she tolerates this better -CT suggests ongoing ileus, and is now on TPN  per general surgery -CT 12/10 as below -Per general surgery they are recommending repeating a CT scan since her WBC is not improving; her small bowel obstruction is improving though given that she has had multiple bowel movements overnight which are loose and she feels like she has less pressure in her abdomen -PT/OT recommending Home Health PT/OT when stable for D/C   Hypokalemia -Likely to be replaced with TPN -Patient's K+ Level Trend: Recent Labs  Lab 07/28/23 0523 07/29/23 0750 07/30/23 0500 07/31/23 0447 08/01/23 0349 08/02/23 0418 08/03/23 0335  K 4.2 3.7 2.6* 3.7 3.5 3.1* 3.4*  -Replete with TPN -Continue to Monitor and Replete as Necessary -Repeat CMP in the AM   Hyponatremia, worsening  -Na+ Trend: Recent Labs  Lab 07/28/23 0523 07/29/23 0750 07/30/23 0500 07/31/23 0447 08/01/23 0349 08/02/23 0418 08/03/23 0335  NA 136 141 138 140 132* 134* 129*  -Continue to Monitor and Trend and Repeat CMP in the AM  .  HTN -Stable blood pressures have been remaining elevated. -Was going to resume her lisinopril but she remains strict n.p.o. given general surgery recommendations and will add IV hydralazine 10 mg every 6 as needed for SBP greater than 160 or DBP greater than 100 -Continue to monitor blood pressures per protocol -Last blood pressure reading was 158/67   Anxiety and Depression -Stable -Resume home meds when able to absorb anything po but remains Strict NPO for now until CT Scan results    COPD -Continue controller med and prn albuterol neb.  -Decreased rate of  IVF given small pleural effusions -Encourage IS (at bedside) SpO2: 100 % O2 Flow Rate (L/min): 2 L/min -See CXR as below; Repeat CXR in the AM    Mild Hypercalcemia -Likely due to dehydration, resolved. -Ca2+ Trend: Recent Labs  Lab 07/28/23 0523 07/29/23 0750 07/30/23 0500 07/31/23 0447 08/01/23 0349 08/02/23 0418 08/03/23 0335  CALCIUM 8.8* 9.8 10.5* 10.1 9.3 9.6 9.7  -Continue to  Monitor and Trend and repeat CBC in the MAM   Leukocytosis, worsening -Initially Resolved, then recurrent postoperatively and now worsened significantly.  -WBC Trend: Recent Labs  Lab 07/28/23 0523 07/29/23 0750 07/30/23 0500 07/31/23 0447 08/01/23 0349 08/02/23 1120 08/03/23 0335  WBC 15.4* 15.4* 17.2* 14.9* 15.5* 22.8* 26.2*  -Restarted IV Zosyn on 12/7.  -Blood cultures negative from 12/5. No operative culture sent. -Blood cultures are NGTD at 5 Days on 07/26/23 but will repeat Blood Cx on 08/02/23 given that WBC is worsened as well as a Urinalysis; repeat urinalysis unremarkable -CT Abd/Pelvis with Contrast 07/31/23 done and showed "Mild distension of the stomach, duodenum and proximal jejunum with scattered air-fluid levels, likely postoperative ileus. No evidence of contrast leak. Small to moderate amount of ascites has mildly increased in volume compared with the previous CT. There is mild adjacent thin peritoneal enhancement without organized fluid collection to suggest an abscess. New small bilateral pleural effusions with dependent airspace opacities at both lung bases, likely atelectasis. No acute vascular findings. Aortic Atherosclerosis" -Given her continued rise in WBC will repeat a CT scan of the abdomen pelvis with contrast today per surgery recommendations -CXR 12/13 done and showed "Low lung volumes with small pleural effusions and  atelectasis. Right PICC line with no adverse features. Enteric tube courses to the abdomen." -Continue to Monitor for S/Sx of Infection and repeat CBC in the AM and repeat CXR in the AM -Will consult ID for further evaluation recommendations given her significant rise in WBC and they are recommending following the CT scan and if the CT is unrevealing that they are recommending removing the PICC line for possible source control  Abnormal LFTs -Worsening and unclear etiology -LFT Trend: Recent Labs  Lab 07/26/23 1358 07/29/23 0750  08/02/23 0418 08/03/23 0335  AST 31 16 40 73*  ALT 41 18 55* 82*  BILITOT 0.7 0.7 1.1 1.0  ALKPHOS 70 41 219* 244*  -Continue to monitor and trend and repeat CMP in the a.m. if necessary will obtain her acute hepatitis panel and further workup   Hyperglycemia in the setting of Prediabetes -HbA1c 6.1%.  -C/w Sensitive Novolog SSI q6h -CBG Trend: Recent Labs  Lab 08/02/23 1313 08/02/23 1816 08/03/23 0005 08/03/23 0636 08/03/23 0744 08/03/23 1126 08/03/23 1531  GLUCAP 137* 94 121* 133* 119* 122* 100*  -Outpatient follow up recommended.   Hypophosphatemia -Phos Level Trend: Recent Labs  Lab 07/27/23 0253 08/01/23 0349 08/02/23 0418 08/03/23 0335  PHOS 3.1 2.4* 3.2 2.7  -Continue to Monitor and Replete as Necessary -Check Phos Level in the AM  Normocytic Anemia -Hgb/Hct Trend: Recent Labs  Lab 07/28/23 0523 07/29/23 0750 07/30/23 0500 07/31/23 0447 08/01/23 0349 08/02/23 1120 08/03/23 0335  HGB 11.4* 10.5* 11.2* 10.4* 9.6* 9.8* 9.6*  HCT 36.1 33.3* 34.6* 31.5* 29.2* 29.3* 28.6*  MCV 100.3* 99.4 97.2 96.3 96.1 95.8 95.3  -Checked Anemia Panel and showed an iron level of 37, UIBC 250, TIBC 287, saturation ratios of 13%, ferritin level of 188, folate level 11.2 and vitamin B12 368 -Continue to Monitor for S/Sx of Bleeding; no  overt Bleeding noted -Repeat CBC in the AM   Moderate Protein Calorie Malnutrition Nutrition Status: Nutrition Problem: Moderate Malnutrition Etiology: acute illness Signs/Symptoms: mild fat depletion, mild muscle depletion, energy intake < 75% for > 7 days Interventions: TPN -Prealbumin was 5 -Nutritionist following and recommending TPN management per pharmacy and recommending 100 mg thiamine daily for 5 days and checking daily weights while on TPN  Hypoalbuminemia -Patient's Albumin Trend: Recent Labs  Lab 07/26/23 1358 07/27/23 0253 07/29/23 0750 08/02/23 0418 08/03/23 0335  ALBUMIN 4.5 3.7 2.6* 2.2* 2.3*  -Continue to Monitor  and Trend and repeat CMP in the AM   DVT prophylaxis: enoxaparin (LOVENOX) injection 30 mg Start: 07/27/23 0800 SCD's Start: 07/26/23 2150 SCDs Start: 07/26/23 2137    Code Status: Full Code Family Communication: Discussed with the patient's daughter at bedside  Disposition Plan:  Level of care: Telemetry Status is: Inpatient Remains inpatient appropriate because: Needs further clinical improvement and clearance by the specialists   Consultants:  General Surgery Infectious Diseases  Procedures:  Delineated as above LAPAROSCOPY DIAGNOSTIC CONVERTED TO OPEN SMALL BOWEL RESECTION   Antimicrobials:  Anti-infectives (From admission, onward)    Start     Dose/Rate Route Frequency Ordered Stop   07/28/23 1330  piperacillin-tazobactam (ZOSYN) IVPB 3.375 g        3.375 g 12.5 mL/hr over 240 Minutes Intravenous Every 8 hours 07/28/23 1232     07/26/23 1745  piperacillin-tazobactam (ZOSYN) IVPB 3.375 g        3.375 g 12.5 mL/hr over 240 Minutes Intravenous  Once 07/26/23 1737 07/26/23 1931       Subjective: Seen and examined at bedside and states that she has had multiple bowel movements which are loose overnight and states that her abdomen is less distended and not as pressure.  She is hard of hearing so she took out her hearing aids.  In no acute distress currently.  WBC is worsening so surgery is repeating her CT scan and infectious diseases been consulted.  She denies any other concerns or complaints at this time.  Objective: Vitals:   08/03/23 0445 08/03/23 0500 08/03/23 0759 08/03/23 1210  BP: (!) 163/71   (!) 158/67  Pulse: 78   75  Resp: 20   17  Temp: 97.6 F (36.4 C)   97.7 F (36.5 C)  TempSrc: Oral   Oral  SpO2: 96%  97% 100%  Weight:  74 kg    Height:        Intake/Output Summary (Last 24 hours) at 08/03/2023 1627 Last data filed at 08/03/2023 1500 Gross per 24 hour  Intake 3411.58 ml  Output 680 ml  Net 2731.58 ml   Filed Weights   07/26/23 1342  08/03/23 0500  Weight: 67.6 kg 74 kg   Examination: Physical Exam:  Constitutional: WN/WD obese chronically ill-appearing Caucasian female in no acute distress Respiratory: Diminished to auscultation bilaterally, no wheezing, rales, rhonchi or crackles. Normal respiratory effort and patient is not tachypenic. No accessory muscle use.  Unlabored breathing Cardiovascular: RRR, no murmurs / rubs / gallops. S1 and S2 auscultated. No extremity edema. .  Abdomen: Soft, slightly-tender, distended secondary body habitus. Bowel sounds positive.  GU: Deferred. Musculoskeletal: No clubbing / cyanosis of digits/nails. No joint deformity upper and lower extremities. Skin: No rashes, lesions, ulcers on limited skin evaluation. No induration; Warm and dry.  Neurologic: She is extremely hard of hearing without her hearing aids but CN 2-12 grossly intact with no focal deficits. Romberg sign and  cerebellar reflexes not assessed.  Psychiatric: Normal judgment and insight. Alert and oriented x 3. Normal mood and appropriate affect.   Data Reviewed: I have personally reviewed following labs and imaging studies  CBC: Recent Labs  Lab 07/30/23 0500 07/31/23 0447 08/01/23 0349 08/02/23 1120 08/03/23 0335  WBC 17.2* 14.9* 15.5* 22.8* 26.2*  NEUTROABS  --   --   --  17.6* 20.6*  HGB 11.2* 10.4* 9.6* 9.8* 9.6*  HCT 34.6* 31.5* 29.2* 29.3* 28.6*  MCV 97.2 96.3 96.1 95.8 95.3  PLT 305 277 254 313 360   Basic Metabolic Panel: Recent Labs  Lab 07/30/23 0500 07/31/23 0447 08/01/23 0349 08/02/23 0418 08/03/23 0335  NA 138 140 132* 134* 129*  K 2.6* 3.7 3.5 3.1* 3.4*  CL 103 104 101 102 100  CO2 24 26 24 24 22   GLUCOSE 137* 123* 126* 124* 125*  BUN 15 19 17 16 19   CREATININE 0.61 0.70 0.53 <0.30* 0.37*  CALCIUM 10.5* 10.1 9.3 9.6 9.7  MG  --   --  2.2 2.1 2.3  PHOS  --   --  2.4* 3.2 2.7   GFR: Estimated Creatinine Clearance: 57.6 mL/min (A) (by C-G formula based on SCr of 0.37 mg/dL (L)). Liver  Function Tests: Recent Labs  Lab 07/29/23 0750 08/02/23 0418 08/03/23 0335  AST 16 40 73*  ALT 18 55* 82*  ALKPHOS 41 219* 244*  BILITOT 0.7 1.1 1.0  PROT 5.6* 5.8* 6.1*  ALBUMIN 2.6* 2.2* 2.3*   No results for input(s): "LIPASE", "AMYLASE" in the last 168 hours. No results for input(s): "AMMONIA" in the last 168 hours. Coagulation Profile: No results for input(s): "INR", "PROTIME" in the last 168 hours. Cardiac Enzymes: No results for input(s): "CKTOTAL", "CKMB", "CKMBINDEX", "TROPONINI" in the last 168 hours. BNP (last 3 results) No results for input(s): "PROBNP" in the last 8760 hours. HbA1C: No results for input(s): "HGBA1C" in the last 72 hours. CBG: Recent Labs  Lab 08/03/23 0005 08/03/23 0636 08/03/23 0744 08/03/23 1126 08/03/23 1531  GLUCAP 121* 133* 119* 122* 100*   Lipid Profile: No results for input(s): "CHOL", "HDL", "LDLCALC", "TRIG", "CHOLHDL", "LDLDIRECT" in the last 72 hours. Thyroid Function Tests: No results for input(s): "TSH", "T4TOTAL", "FREET4", "T3FREE", "THYROIDAB" in the last 72 hours. Anemia Panel: Recent Labs    08/03/23 0335  VITAMINB12 368  FOLATE 11.2  FERRITIN 188  TIBC 287  IRON 37  RETICCTPCT 2.2   Sepsis Labs: No results for input(s): "PROCALCITON", "LATICACIDVEN" in the last 168 hours.  Recent Results (from the past 240 hours)  Blood culture (routine x 2)     Status: None   Collection Time: 07/26/23  9:16 PM   Specimen: BLOOD  Result Value Ref Range Status   Specimen Description   Final    BLOOD BLOOD RIGHT HAND Performed at [facility], [address] [place], [place] [address]    Special Requests   Final    BOTTLES DRAWN AEROBIC AND ANAEROBIC Blood Culture results may not be optimal due to an inadequate volume of blood received in culture bottles Performed at [facility], [address] [place], [place] [address]    Culture   Final    NO GROWTH 5 DAYS Performed at [facility], [address] [place], [place] [address]    Report Status 07/31/2023 FINAL  Final  Blood culture (routine x 2)     Status: None   Collection Time: 07/26/23  9:16 PM   Specimen: BLOOD  Result Value Ref Range Status   Specimen Description   Final    BLOOD RIGHT ANTECUBITAL Performed at [facility], [address] [place], [place] [address]    Special Requests   Final    BOTTLES DRAWN AEROBIC AND ANAEROBIC Blood Culture adequate volume Performed at [facility], [address], [place], [place] [address]    Culture   Final    NO GROWTH 5 DAYS Performed at [facility], [address] [place], [place] [address]    Report Status 07/31/2023 FINAL  Final  Culture, blood (Routine X 2) w Reflex to ID Panel     Status: None (Preliminary result)   Collection Time: 08/02/23  4:08 PM   Specimen: BLOOD LEFT ARM  Result Value Ref Range Status   Specimen Description   Final    BLOOD LEFT ARM Performed at [facility], [address] [place], [place] [address]    Special Requests   Final    BOTTLES DRAWN AEROBIC AND ANAEROBIC Blood Culture results may not be optimal due to an inadequate volume of blood received in culture bottles Performed at [facility], [address] [place], [place] [address]    Culture   Final    NO GROWTH < 24 HOURS Performed at [facility], [address], [place], [place] [address]    Report Status PENDING  Incomplete  Culture, blood (Routine X 2) w Reflex to ID Panel     Status: None (Preliminary result)   Collection Time: 08/02/23  4:16 PM   Specimen: BLOOD RIGHT HAND  Result Value Ref Range Status   Specimen Description   Final    BLOOD RIGHT HAND Performed at [facility], [address] [place], [place] [address]    Special Requests   Final    BOTTLES DRAWN AEROBIC AND ANAEROBIC Blood Culture results may not be optimal due to an inadequate volume of blood  received in culture bottles Performed at [facility], [address] [place], [place] [address]    Culture   Final    NO GROWTH < 24 HOURS Performed at [facility], [address], [place], [place] [address]    Report Status PENDING  Incomplete    Radiology Studies: DG Abd Portable 1V Result Date: 08/03/2023 CLINICAL DATA:  [id number] Ileus, postoperative (HCC) [id number] EXAM: PORTABLE ABDOMEN - 1 VIEW COMPARISON:  07/31/2023 FINDINGS: Enteric tube terminates within the stomach. Slightly increasing size and number of dilated small bowel loops within the abdomen. No gross free intraperitoneal air on supine imaging. IMPRESSION: Slightly increasing size and number of dilated small bowel loops within the abdomen, which may represent ileus or small bowel obstruction. Electronically Signed   By: Duanne Guess D.O.   On: 08/03/2023 12:48   DG CHEST PORT 1 VIEW Result Date: 08/03/2023 CLINICAL DATA:  72 year old female with leukocytosis. Status post abdominal surgery, partial small-bowel resection for perforated closed loop obstruction. EXAM: PORTABLE CHEST 1 VIEW COMPARISON:  Chest radiographs 01/06/2018. CT Abdomen and Pelvis 07/31/2023. FINDINGS: Portable AP semi upright view at 0458 hours. Enteric tube courses to the abdomen, tip not included. Right side PICC line in place, terminates a cavoatrial junction level. Low lung volumes. Probable ongoing small pleural effusion seen recently on CT. Mild lung base atelectasis. No pneumothorax, pulmonary edema, air bronchograms. Normal cardiac size and mediastinal contours. Paucity of  bowel gas in the visible abdomen. No acute osseous abnormality identified. IMPRESSION: 1. Low lung volumes with small pleural effusions and  atelectasis. 2. Right PICC line with no adverse features. Enteric tube courses to the abdomen. Electronically Signed   By: Odessa Fleming M.D.   On: 08/03/2023 08:35   Scheduled Meds:  Chlorhexidine Gluconate Cloth  6 each  Topical Daily   enoxaparin (LOVENOX) injection  30 mg Subcutaneous Q24H   escitalopram  20 mg Oral Daily   gabapentin  300 mg Oral TID   insulin aspart  0-9 Units Subcutaneous Q6H   lisinopril  10 mg Oral QHS   mometasone-formoterol  2 puff Inhalation BID   montelukast  10 mg Oral QHS   nicotine  21 mg Transdermal Daily   simvastatin  40 mg Oral Daily   sodium chloride flush  10-40 mL Intracatheter Q12H   Continuous Infusions:  acetaminophen 1,000 mg (08/03/23 1306)   dextrose 5 % and 0.45 % NaCl with KCl 40 mEq/L     TPN (CLINIMIX-E) Adult     And   fat emul(SMOFlipid)     piperacillin-tazobactam (ZOSYN)  IV 3.375 g (08/03/23 1325)   potassium chloride 10 mEq (08/03/23 1548)   TPN (CLINIMIX-E) Adult 40 mL/hr at 08/02/23 1746    LOS: 8 days   Marguerita Merles, DO [facility] Available via Epic secure chat 7am-7pm After these hours, please refer to coverage provider listed on [email or web address] 08/03/2023, 4:27 PM

## 2023-08-03 NOTE — Progress Notes (Signed)
PHARMACY - TOTAL PARENTERAL NUTRITION CONSULT NOTE   Indication: Prolonged ileus  Patient Measurements: Height: 5\' 1"  (154.9 cm) Weight: 74 kg (163 lb 2.3 oz) IBW/kg (Calculated) : 47.8 TPN AdjBW (KG): 52.7 Body mass index is 30.83 kg/m. Usual Weight:   Assessment:  Pharmacy is consulted to start TPN on 72 yo female with a prolonged ileus. Pt was taken emergently to OR on 12/5 after CT was concerning for closed loop bowel obstruction with ischemia.   Glucose / Insulin:  - HbA1c 6.1%. prediabetic  - CBGs at goal < 150 - 3 units of insulin on SSI given in past 24 hours Electrolytes:  - K+ 3.4, low  - Na low at 129 slight trend down - CorrCa is elevated at 11.1 - Other electrolytes are WNL  - Keep K >4.0 and Mg >2.0  for SBO Renal:  - Scr <1 - BUN 19 Hepatic:  - AST/ALT 73/82, trending up Intake / Output; MIVF:  - UOP uncharted, 480 mL output through NG tube  - On D5 1/2NS with 40 meq KCl at 50 ml/hr GI Imaging: GI Surgeries / Procedures:  - 12/5 SBO and perforated closed loop bowel obstruction with ischemic jejunum   Central access: 12/10 TPN start date: 12/10   Nutritional Goals:   Clinimix E 8/10 1000 mL daily will provide 80 grams of protein at 1164 Kcal with Lipids. D10 at 60 ml/hr will provide 490 kcal for a total of 1654 kcal. This will meet both the protein and calorie goals    Due to the Baxter IV fluid disruption, all adult parenteral nutrition will utilize premade Clinimix products +/- fat emulsion infusion. Our goal will be to continue providing as close to 100% of our patient's nutritional needs, but due to limited TPN Clinimix concentrations we will meet at least 80% of protein and 75% of kcal goals.    RD Assessment:   Estimated Needs Total Energy Estimated Needs: 1600-1800 Total Protein Estimated Needs: 80-90g Total Fluid Estimated Needs: 1.8L/day  Current Nutrition:  NPO  Plan:  NOW: Potassium chloride 10 meq IV x 6  At 1800: Continue   Clinimix E 8/10 at 40 mL/hr and lipids at 1800 Will not add D10 orders yet until electrolytes have stabilized.  Electrolytes in TPN (per liter, non-adjustable):  Na - 35 mEq/L K - 30 mEq/L Ca - 5 mEq/L Mg - 5 mEq/L Phos - 15 mmol/L Cl:Ac ratio - ~1:1 Add standard MVI and trace elements to TPN Continue Sensitive q6h SSI and adjust as needed  Continue MIVF to 50 mL/hr at 1800 BMP, magnesium, phosphorus with AM labs  Monitor TPN labs on Mon/Thurs    Adalberto Cole, PharmD, BCPS 08/03/2023 11:46 AM

## 2023-08-03 NOTE — Progress Notes (Signed)
Nutrition Follow-up  DOCUMENTATION CODES:   Non-severe (moderate) malnutrition in context of acute illness/injury  INTERVENTION:   Monitor magnesium, potassium, and phosphorus for at least 3 days, MD to replete as needed, as pt is at risk for refeeding syndrome.    -TPN management per Pharmacy -Recommend 100 mg Thiamine daily x 5 days -Daily weights while on TPN  NUTRITION DIAGNOSIS:   Moderate Malnutrition related to acute illness as evidenced by mild fat depletion, mild muscle depletion, energy intake < 75% for > 7 days.  Ongoing  GOAL:   Patient will meet greater than or equal to 90% of their needs  Progressing but advancing slow d/t electrolytes  MONITOR:   Diet advancement, Weight trends, I & O's, Labs (TPN)  ASSESSMENT:   72 y.o. female with PMH of HTN, anxiety, depression, COPD, HSV, low back pain and mild dementia presenting with acute abdominal pain and vomiting. She was found to have closed-loop bowel obstruction and ischemia, taken to the OR for laparoscopic, converted to open, small bowel resection with intraoperative findings of perforated closed loop obstruction with ischemic jejunum.  12/5: admitted, s/p open SB resection 12/9: NGT placed for suction, Wound VAC placed  NGT still in place, output: ~480 ml  TPN: Continuing Clinimix E 8/10 @ 40 ml/hr + SMOFLIPID, providing 1137 kcals and 76g protein.  Pharmacy plans to add D10 infusion at 60 ml/hr to provide additional 490 kcals once electrolytes stabilize.  Medications: D5 infusion, KCl  Labs reviewed: CBGs: 94-133 Low Na Low K   Diet Order:   Diet Order             Diet NPO time specified Except for: Ice Chips  Diet effective now                   EDUCATION NEEDS:   Education needs have been addressed  Skin:  Skin Assessment: Skin Integrity Issues: Skin Integrity Issues:: Incisions Incisions: 12/5 abdomen  Last BM:  12/13  Height:   Ht Readings from Last 1 Encounters:  07/26/23  5\' 1"  (1.549 m)    Weight:   Wt Readings from Last 1 Encounters:  08/03/23 74 kg    BMI:  Body mass index is 30.83 kg/m.  Estimated Nutritional Needs:   Kcal:  1600-1800  Protein:  80-90g  Fluid:  1.8L/day   Tilda Franco, MS, RD, LDN Inpatient Clinical Dietitian Contact via Secure chat

## 2023-08-03 NOTE — Progress Notes (Signed)
Physical Therapy Treatment Patient Details Name: Laura Bradshaw MRN: [id number] DOB: [birth year] Today's Date: 08/03/2023   History of Present Illness Pt is 72 yo female admitted on 07/26/23 with SBO, perforation, jejunal ischemia and is s/p open small bowel resection on 07/26/23.  Midline wound open - possible wound vac placement Monday, 12/9.  Pt with hx including but not limited to HTN, anxiety, depression, COPD, HSV, low back pain, mild dementia.    PT Comments  Pt agreeable to therapy this morning.  Needs increased time for transfers for pain control and line management but performs with min A.  Ambulated 100' with RW and CGA.  Fatigued but tolerated therapy.  Cont POC.  Recommend HHPT and supervision at d/c.     If plan is discharge home, recommend the following: A little help with walking and/or transfers;A little help with bathing/dressing/bathroom;Help with stairs or ramp for entrance;Assistance with cooking/housework   Can travel by private Scientist, research (medical) walker (2 wheels)    Recommendations for Other Services       Precautions / Restrictions Precautions Precautions: Fall Precaution Comments: abdominal wound vac, NGT     Mobility  Bed Mobility Overal bed mobility: Needs Assistance Bed Mobility: Rolling, Sidelying to Sit Rolling: Min assist Sidelying to sit: Min assist       General bed mobility comments: Cues for log roll, using rail with min A to lift trunk    Transfers Overall transfer level: Needs assistance Equipment used: Rolling walker (2 wheels) Transfers: Sit to/from Stand Sit to Stand: Min assist           General transfer comment: Light min A to stand    Ambulation/Gait Ambulation/Gait assistance: Contact guard assist Gait Distance (Feet): 100 Feet Assistive device: Rolling walker (2 wheels) Gait Pattern/deviations: Decreased stride length, Step-through pattern Gait velocity: decreased     General Gait  Details: Slowed gait with 3 standing rest breaks but steady; cues for RW around obstacles   Stairs             Wheelchair Mobility     Tilt Bed    Modified Rankin (Stroke Patients Only)       Balance Overall balance assessment: Needs assistance Sitting-balance support: No upper extremity supported Sitting balance-Leahy Scale: Fair Sitting balance - Comments: did not challenge   Standing balance support: Bilateral upper extremity supported Standing balance-Leahy Scale: Poor Standing balance comment: RW and CGA                            Cognition Arousal: Alert Behavior During Therapy: WFL for tasks assessed/performed Overall Cognitive Status: Within Functional Limits for tasks assessed                                          Exercises      General Comments General comments (skin integrity, edema, etc.): Daughter at bedside; NG tube was clamped      Pertinent Vitals/Pain Pain Assessment Pain Assessment: Faces Faces Pain Scale: Hurts a little bit Pain Location: abdomen Pain Descriptors / Indicators: Grimacing, Guarding Pain Intervention(s): Limited activity within patient's tolerance, Monitored during session, Repositioned    Home Living  Prior Function            PT Goals (current goals can now be found in the care plan section) Progress towards PT goals: Progressing toward goals    Frequency    Min 1X/week      PT Plan      Co-evaluation              AM-PAC PT "6 Clicks" Mobility   Outcome Measure  Help needed turning from your back to your side while in a flat bed without using bedrails?: A Little Help needed moving from lying on your back to sitting on the side of a flat bed without using bedrails?: A Little Help needed moving to and from a bed to a chair (including a wheelchair)?: A Little Help needed standing up from a chair using your arms (e.g., wheelchair or  bedside chair)?: A Little Help needed to walk in hospital room?: A Little Help needed climbing 3-5 steps with a railing? : A Lot 6 Click Score: 17    End of Session Equipment Utilized During Treatment: Gait belt (gait belt high) Activity Tolerance: Patient tolerated treatment well Patient left: in chair;with call bell/phone within reach;with family/visitor present (chair alarm would not fit, pt verbalizing will callo for assist and dtr reports she will be there to call nurse) Nurse Communication: Mobility status PT Visit Diagnosis: Difficulty in walking, not elsewhere classified (R26.2)     Time: 1020-1040 PT Time Calculation (min) (ACUTE ONLY): 20 min  Charges:    $Gait Training: 8-22 mins PT General Charges $$ ACUTE PT VISIT: 1 Visit                     Anise Salvo, PT Acute Rehab Services [facility] [phone]    Rayetta Humphrey 08/03/2023, 11:15 AM

## 2023-08-03 NOTE — Progress Notes (Signed)
Progress Note  8 Days Post-Op  Subjective: Pt had several BMs overnight, mostly loose but reports feeling less gas pressure in abdomen after this. Did not mobilize as much yesterday because robaxin during the day was making her more sleepy - discussed changing this medication to PRN today to allow her to utilize at night still. WBC elevated more, afebrile. Discussed with RN, TRH and patient and family and will plan repeat CT today.   Objective: Vital signs in last 24 hours: Temp:  [97.5 F (36.4 C)-98.2 F (36.8 C)] 97.6 F (36.4 C) (12/13 0445) Pulse Rate:  [78-85] 78 (12/13 0445) Resp:  [16-20] 20 (12/13 0445) BP: (155-180)/(71-82) 163/71 (12/13 0445) SpO2:  [95 %-98 %] 97 % (12/13 0759) Weight:  [74 kg] 74 kg (12/13 0500) Last BM Date : 07/31/23  Intake/Output from previous day: 12/12 0701 - 12/13 0700 In: 2502.1 [I.V.:1400.2; IV Piggyback:1101.9] Out: 680 [Urine:200; Emesis/NG output:480] Intake/Output this shift: No intake/output data recorded.  PE: General: pleasant, WD, WN female who is laying in bed in NAD Lungs: Respiratory effort nonlabored Abd: soft, appropriately ttp, midline VAC with good seal, NGT with thin bilious output   Lab Results:  Recent Labs    08/02/23 1120 08/03/23 0335  WBC 22.8* 26.2*  HGB 9.8* 9.6*  HCT 29.3* 28.6*  PLT 313 360   BMET Recent Labs    08/02/23 0418 08/03/23 0335  NA 134* 129*  K 3.1* 3.4*  CL 102 100  CO2 24 22  GLUCOSE 124* 125*  BUN 16 19  CREATININE <0.30* 0.37*  CALCIUM 9.6 9.7   PT/INR No results for input(s): "LABPROT", "INR" in the last 72 hours. CMP     Component Value Date/Time   NA 129 (L) 08/03/2023 0335   K 3.4 (L) 08/03/2023 0335   CL 100 08/03/2023 0335   CO2 22 08/03/2023 0335   GLUCOSE 125 (H) 08/03/2023 0335   BUN 19 08/03/2023 0335   CREATININE 0.37 (L) 08/03/2023 0335   CALCIUM 9.7 08/03/2023 0335   PROT 6.1 (L) 08/03/2023 0335   ALBUMIN 2.3 (L) 08/03/2023 0335   AST 73 (H)  08/03/2023 0335   ALT 82 (H) 08/03/2023 0335   ALKPHOS 244 (H) 08/03/2023 0335   BILITOT 1.0 08/03/2023 0335   GFRNONAA >60 08/03/2023 0335   GFRAA >60 01/06/2018 1315   Lipase     Component Value Date/Time   LIPASE 25 07/26/2023 1358       Studies/Results: DG CHEST PORT 1 VIEW Result Date: 08/03/2023 CLINICAL DATA:  72 year old female with leukocytosis. Status post abdominal surgery, partial small-bowel resection for perforated closed loop obstruction. EXAM: PORTABLE CHEST 1 VIEW COMPARISON:  Chest radiographs 01/06/2018. CT Abdomen and Pelvis 07/31/2023. FINDINGS: Portable AP semi upright view at 0458 hours. Enteric tube courses to the abdomen, tip not included. Right side PICC line in place, terminates a cavoatrial junction level. Low lung volumes. Probable ongoing small pleural effusion seen recently on CT. Mild lung base atelectasis. No pneumothorax, pulmonary edema, air bronchograms. Normal cardiac size and mediastinal contours. Paucity of bowel gas in the visible abdomen. No acute osseous abnormality identified. IMPRESSION: 1. Low lung volumes with small pleural effusions and  atelectasis. 2. Right PICC line with no adverse features. Enteric tube courses to the abdomen. Electronically Signed   By: Odessa Fleming M.D.   On: 08/03/2023 08:35    Anti-infectives: Anti-infectives (From admission, onward)    Start     Dose/Rate Route Frequency Ordered Stop   07/28/23  1330  piperacillin-tazobactam (ZOSYN) IVPB 3.375 g        3.375 g 12.5 mL/hr over 240 Minutes Intravenous Every 8 hours 07/28/23 1232     07/26/23 1745  piperacillin-tazobactam (ZOSYN) IVPB 3.375 g        3.375 g 12.5 mL/hr over 240 Minutes Intravenous  Once 07/26/23 1737 07/26/23 1931        Assessment/Plan  Closed loop SBO with ischemic small bowel and perforation  POD8 s/p laparoscopy converted to open small bowel resection Dr. Maisie Fus - post-op ileus  - WBC up to 26K this AM, afebrile - CXR with possible R effusion,  UA not suggestive of UTI, repeat CT with IV contrast today to determine if fluid noted on last CT more organized - VAC M/W/F - CT AP 12/10 with mild ileus, no contrast extravasation from GI tract, small to moderate volume ascites without organized collection to suggest abscess - ok to clamp NGT but keep NPO until after CT - continue to mobilize as tolerated - Keep K >4.0 and Mg >2.0  Severe protein calorie malnutrition - PICC and TPN,  prealbumin 5 12/10  FEN: NPO, PICC/TPN, NGT to LIWS VTE: LMWH ID: Zosyn 12/5>>  LOS: 8 days     Laura Bradshaw, [facility] 08/03/2023, 9:12 AM Please see Amion for pager number during day hours 7:00am-4:30pm

## 2023-08-03 NOTE — Consult Note (Signed)
[facility]    Date of Admission:  07/26/2023   Total days of inpatient antibiotics 7        Reason for Consult abdominal infection  Principal Problem:   Small bowel obstruction (HCC) Active Problems:   Malnutrition of moderate degree   Assessment: 72 year old female admitted with abdominal pain found to have closed-loop bowel obstruction and ischemia status post open small bowel resection complicated by: #Leukocytosis setting of intra-abdominal infection #Small bowel obstruction status post bowel resection #Requiring TPN secondary to #2-PICC x 2 days -CT on 12/10 showed postoperative ileus.  Small to moderate ascites mildly increased from previous CT.  Mild adjacent thin peritoneal enhancement not suggestive of an abscess.,  New bilateral pleural effusions likely listhesis. - WBC counts trended up to 26K today.  Patient is afebrile.  She is getting TPN. Recommendations:  -Continue Zosyn - Follow-up CT abdomen pelvis - Follow-up blood cultures from 12/12 - CT is unrevealing then would need to remove PICC line for possible source control.  Dr. Elinor Parkinson  will be covering this weekend. Microbiology:   Antibiotics: Zosyn 12/5-present  Cultures: Blood /5 no growth 12/12 Urine  Other   HPI: Laura Bradshaw is a 72 y.o. female with history of hypertension, anxiety, depression, COPD, HSV, low back pain mild dementia presented initially with abdominal pain and vomiting.  Started about 3 AM in the morning.  On arrival she had WBC 16K.  CT concerning for closed-loop bowel obstruction and ischemia.  Taken to the OR emergently 12/5 for perforated closed-loop bowel obstruction ischemic jejunum requiring laparoscopic diagnostic converted to open small bowel resection.  She has been on pip-tazo.  White cell count worsened.  Repeat CT on 12/12 appears stable overall with possible peritoneal enhancement collection those not yet suggestive of an abscess.   ID engaged for antibiotic recommendations.   Review of Systems: Review of Systems  All other systems reviewed and are negative.   Past Medical History:  Diagnosis Date   Anxiousness    Back pain    Diverticulitis    Environmental allergies    HOH (hard of hearing)    Hypercholesteremia    Hypertension    Restless leg     Social History   Tobacco Use   Smoking status: Every Day    Types: Cigarettes   Smokeless tobacco: Never  Vaping Use   Vaping status: Never Used  Substance Use Topics   Alcohol use: Never   Drug use: Never    History reviewed. No pertinent family history. Scheduled Meds:  Chlorhexidine Gluconate Cloth  6 each Topical Daily   enoxaparin (LOVENOX) injection  30 mg Subcutaneous Q24H   escitalopram  20 mg Oral Daily   gabapentin  300 mg Oral TID   insulin aspart  0-9 Units Subcutaneous Q6H   lisinopril  10 mg Oral QHS   mometasone-formoterol  2 puff Inhalation BID   montelukast  10 mg Oral QHS   nicotine  21 mg Transdermal Daily   simvastatin  40 mg Oral Daily   sodium chloride flush  10-40 mL Intracatheter Q12H   Continuous Infusions:  acetaminophen 1,000 mg (08/03/23 1306)   dextrose 5 % and 0.45 % NaCl with KCl 40 mEq/L     TPN (CLINIMIX-E) Adult     And   fat emul(SMOFlipid)     piperacillin-tazobactam (ZOSYN)  IV 3.375 g (08/03/23 1325)   potassium chloride 10 mEq (08/03/23 1326)  TPN (CLINIMIX-E) Adult 40 mL/hr at 08/02/23 1746   PRN Meds:.albuterol, fluticasone, guaiFENesin-dextromethorphan, hydrALAZINE, LORazepam, methocarbamol (ROBAXIN) injection, ondansetron **OR** ondansetron (ZOFRAN) IV, simethicone, sodium chloride flush Allergies  Allergen Reactions   Ciprofloxacin Hives   Codeine Other (See Comments)    Welts    Sulfonamide Derivatives Itching    welts   Hydrocodone Anxiety    OBJECTIVE: Blood pressure (!) 158/67, pulse 75, temperature 97.7 F (36.5 C), temperature source Oral, resp. rate 17, height 5\' 1"  (1.549 m),  weight 74 kg, SpO2 100%.  Physical Exam Constitutional:      Appearance: Normal appearance.  HENT:     Head: Normocephalic and atraumatic.     Right Ear: Tympanic membrane normal.     Left Ear: Tympanic membrane normal.     Nose: Nose normal.     Mouth/Throat:     Mouth: Mucous membranes are moist.  Eyes:     Extraocular Movements: Extraocular movements intact.     Conjunctiva/sclera: Conjunctivae normal.     Pupils: Pupils are equal, round, and reactive to light.  Cardiovascular:     Rate and Rhythm: Normal rate and regular rhythm.     Heart sounds: No murmur heard.    No friction rub. No gallop.  Pulmonary:     Effort: Pulmonary effort is normal.     Breath sounds: Normal breath sounds.  Abdominal:     Comments: Abdominal wound.  Musculoskeletal:        General: Normal range of motion.  Skin:    General: Skin is warm and dry.  Neurological:     General: No focal deficit present.     Mental Status: She is alert and oriented to person, place, and time.  Psychiatric:        Mood and Affect: Mood normal.     Lab Results Lab Results  Component Value Date   WBC 26.2 (H) 08/03/2023   HGB 9.6 (L) 08/03/2023   HCT 28.6 (L) 08/03/2023   MCV 95.3 08/03/2023   PLT 360 08/03/2023    Lab Results  Component Value Date   CREATININE 0.37 (L) 08/03/2023   BUN 19 08/03/2023   NA 129 (L) 08/03/2023   K 3.4 (L) 08/03/2023   CL 100 08/03/2023   CO2 22 08/03/2023    Lab Results  Component Value Date   ALT 82 (H) 08/03/2023   AST 73 (H) 08/03/2023   ALKPHOS 244 (H) 08/03/2023   BILITOT 1.0 08/03/2023       Danelle Earthly, MD [facility] 08/03/2023, 2:29 PM I have personally spent 82 minutes involved in face-to-face and non-face-to-face activities for this patient on the day of the visit. Professional time spent includes the following activities: Preparing to see the patient (review of tests), Obtaining and/or reviewing  separately obtained history (admission/discharge record), Performing a medically appropriate examination and/or evaluation , Ordering medications/tests/procedures, referring and communicating with other health care professionals, Documenting clinical information in the EMR, Independently interpreting results (not separately reported), Communicating results to the patient/family/caregiver, Counseling and educating the patient/family/caregiver and Care coordination (not separately reported).

## 2023-08-03 NOTE — Plan of Care (Signed)
  Problem: Nutrition: Goal: Adequate nutrition will be maintained Outcome: Not Progressing   Problem: Activity: Goal: Risk for activity intolerance will decrease Outcome: Not Progressing   Problem: Elimination: Goal: Will not experience complications related to bowel motility Outcome: Not Progressing Goal: Will not experience complications related to urinary retention Outcome: Not Progressing   Problem: Pain Management: Goal: General experience of comfort will improve Outcome: Not Progressing

## 2023-08-04 ENCOUNTER — Encounter (HOSPITAL_COMMUNITY): Payer: Self-pay | Admitting: Student

## 2023-08-04 DIAGNOSIS — D72829 Elevated white blood cell count, unspecified: Secondary | ICD-10-CM | POA: Diagnosis not present

## 2023-08-04 DIAGNOSIS — T8143XA Infection following a procedure, organ and space surgical site, initial encounter: Secondary | ICD-10-CM

## 2023-08-04 DIAGNOSIS — E876 Hypokalemia: Secondary | ICD-10-CM | POA: Diagnosis not present

## 2023-08-04 DIAGNOSIS — K56609 Unspecified intestinal obstruction, unspecified as to partial versus complete obstruction: Secondary | ICD-10-CM | POA: Diagnosis not present

## 2023-08-04 DIAGNOSIS — E440 Moderate protein-calorie malnutrition: Secondary | ICD-10-CM | POA: Diagnosis not present

## 2023-08-04 DIAGNOSIS — K651 Peritoneal abscess: Secondary | ICD-10-CM

## 2023-08-04 LAB — CBC WITH DIFFERENTIAL/PLATELET
Abs Immature Granulocytes: 0.9 K/uL — ABNORMAL HIGH (ref 0.00–0.07)
Basophils Absolute: 0.1 K/uL (ref 0.0–0.1)
Basophils Relative: 0 %
Eosinophils Absolute: 0.5 K/uL (ref 0.0–0.5)
Eosinophils Relative: 2 %
HCT: 26.8 % — ABNORMAL LOW (ref 36.0–46.0)
Hemoglobin: 9.2 g/dL — ABNORMAL LOW (ref 12.0–15.0)
Immature Granulocytes: 3 %
Lymphocytes Relative: 8 %
Lymphs Abs: 2 K/uL (ref 0.7–4.0)
MCH: 32.5 pg (ref 26.0–34.0)
MCHC: 34.3 g/dL (ref 30.0–36.0)
MCV: 94.7 fL (ref 80.0–100.0)
Monocytes Absolute: 1.5 K/uL — ABNORMAL HIGH (ref 0.1–1.0)
Monocytes Relative: 6 %
Neutro Abs: 21.5 K/uL — ABNORMAL HIGH (ref 1.7–7.7)
Neutrophils Relative %: 81 %
Platelets: 457 K/uL — ABNORMAL HIGH (ref 150–400)
RBC: 2.83 MIL/uL — ABNORMAL LOW (ref 3.87–5.11)
RDW: 14.1 % (ref 11.5–15.5)
WBC: 26.5 K/uL — ABNORMAL HIGH (ref 4.0–10.5)
nRBC: 0 % (ref 0.0–0.2)

## 2023-08-04 LAB — GLUCOSE, CAPILLARY
Glucose-Capillary: 125 mg/dL — ABNORMAL HIGH (ref 70–99)
Glucose-Capillary: 130 mg/dL — ABNORMAL HIGH (ref 70–99)
Glucose-Capillary: 133 mg/dL — ABNORMAL HIGH (ref 70–99)
Glucose-Capillary: 136 mg/dL — ABNORMAL HIGH (ref 70–99)

## 2023-08-04 LAB — COMPREHENSIVE METABOLIC PANEL
ALT: 81 U/L — ABNORMAL HIGH (ref 0–44)
AST: 49 U/L — ABNORMAL HIGH (ref 15–41)
Albumin: 2.2 g/dL — ABNORMAL LOW (ref 3.5–5.0)
Alkaline Phosphatase: 230 U/L — ABNORMAL HIGH (ref 38–126)
Anion gap: 7 (ref 5–15)
BUN: 17 mg/dL (ref 8–23)
CO2: 24 mmol/L (ref 22–32)
Calcium: 9.4 mg/dL (ref 8.9–10.3)
Chloride: 97 mmol/L — ABNORMAL LOW (ref 98–111)
Creatinine, Ser: 0.53 mg/dL (ref 0.44–1.00)
GFR, Estimated: >60 mL/min (ref >60)
Glucose, Bld: 125 mg/dL — ABNORMAL HIGH (ref 70–99)
Potassium: 3.7 mmol/L (ref 3.5–5.1)
Sodium: 128 mmol/L — ABNORMAL LOW (ref 135–145)
Total Bilirubin: 0.6 mg/dL (ref <1.2)
Total Protein: 5.9 g/dL — ABNORMAL LOW (ref 6.5–8.1)

## 2023-08-04 LAB — PHOSPHORUS: Phosphorus: 2.8 mg/dL (ref 2.5–4.6)

## 2023-08-04 LAB — MAGNESIUM: Magnesium: 2.3 mg/dL (ref 1.7–2.4)

## 2023-08-04 MED ORDER — INFUVITE ADULT IV SOLN
INTRAVENOUS | Status: AC
  Filled 2023-08-04 (×2): qty 1000

## 2023-08-04 MED ORDER — POTASSIUM CHLORIDE 10 MEQ/100ML IV SOLN
10.0000 meq | INTRAVENOUS | Status: AC
  Administered 2023-08-04 (×2): 10 meq via INTRAVENOUS
  Filled 2023-08-04 (×2): qty 100

## 2023-08-04 MED ORDER — THIAMINE HCL 100 MG/ML IJ SOLN
100.0000 mg | Freq: Every day | INTRAMUSCULAR | Status: AC
  Administered 2023-08-04 – 2023-08-08 (×5): 100 mg via INTRAVENOUS
  Filled 2023-08-04 (×5): qty 2

## 2023-08-04 MED ORDER — FAT EMUL FISH OIL/PLANT BASED 20% (SMOFLIPID)IV EMUL
250.0000 mL | INTRAVENOUS | Status: AC
  Administered 2023-08-04: 250 mL via INTRAVENOUS
  Filled 2023-08-04: qty 250

## 2023-08-04 MED ORDER — DEXTROSE 10 % IV SOLN: INTRAVENOUS | Status: AC

## 2023-08-04 MED ORDER — ACETAMINOPHEN 10 MG/ML IV SOLN
1000.0000 mg | Freq: Four times a day (QID) | INTRAVENOUS | Status: AC
  Administered 2023-08-04 – 2023-08-05 (×4): 1000 mg via INTRAVENOUS
  Filled 2023-08-04 (×4): qty 100

## 2023-08-04 NOTE — Plan of Care (Signed)
  Problem: Nutrition: Goal: Adequate nutrition will be maintained Outcome: Progressing   Problem: Coping: Goal: Level of anxiety will decrease Outcome: Progressing   

## 2023-08-04 NOTE — Progress Notes (Signed)
PHARMACY - TOTAL PARENTERAL NUTRITION CONSULT NOTE   Indication: Prolonged ileus  Patient Measurements: Height: 5\' 1"  (154.9 cm) Weight: 72.3 kg (159 lb 6.3 oz) IBW/kg (Calculated) : 47.8 TPN AdjBW (KG): 52.7 Body mass index is 30.12 kg/m. Usual Weight:   Assessment:  Pharmacy is consulted to start TPN on 72 yo female with a prolonged ileus. Pt was taken emergently to OR on 12/5 after CT was concerning for closed loop bowel obstruction with ischemia.   Glucose / Insulin:  - HbA1c 6.1%. prediabetic  - CBGs at goal < 150 - 2 units of insulin on SSI given in past 24 hours Electrolytes:  Lytes wnl exc Na 128. CorrCa borderline high at 10.7. - Keep K >4.0 and Mg >2.0  for SBO Renal:  SCr and BUN wnl Hepatic:  - AST/ALT trending up. Alk Phos elevated but stable at 230 Intake / Output; MIVF:  - UOP uncharted, output through NG tube. Having BMs - On D5 1/2NS with 40 meq KCl at 50 ml/hr GI Imaging: 12/13 Abdominal xray: Multiple fluid collections concerning for abscesses. Possible post op ileus and enteritis.  GI Surgeries / Procedures:  - 12/5 SBO and perforated closed loop bowel obstruction with ischemic jejunum   Central access: 12/10 TPN start date: 12/10   Nutritional Goals:   Clinimix E 8/10 1000 mL daily will provide 80 grams of protein at 1164 Kcal with Lipids. D10 at 60 ml/hr will provide 490 kcal for a total of 1654 kcal. This will meet both the protein and calorie goals   Due to the Baxter IV fluid disruption, all adult parenteral nutrition will utilize premade Clinimix products +/- fat emulsion infusion. Our goal will be to continue providing as close to 100% of our patient's nutritional needs, but due to limited TPN Clinimix concentrations we will meet at least 80% of protein and 75% of kcal goals.   RD Assessment: Estimated Needs Total Energy Estimated Needs: 1600-1800 Total Protein Estimated Needs: 80-90g Total Fluid Estimated Needs: 1.8L/day  Current  Nutrition:  NPO  Plan:  NOW: Potassium chloride 10 meq IV x 2  At 1800: Continue Clinimix E 8/10 at 40 mL/hr Continue SMOFlipid daily over 12 hours Electrolytes in TPN (per liter, non-adjustable):  Na - 35 mEq/L K - 30 mEq/L Ca - 5 mEq/L Mg - 5 mEq/L Phos - 15 mmol/L Cl:Ac ratio - ~1:1 Add standard MVI and trace elements to TPN Add thiamine 100mg  IV x 5 days Continue Sensitive q6h SSI and adjust as needed  D51/2 NS with KCl to end tonight. Then start D10 at 61ml/hr with new TPN Check Bmet tomorrow Monitor TPN labs on Mon/Thurs  Enzo Bi, PharmD, [place], [place] Clinical Pharmacist 08/04/2023 8:01 AM

## 2023-08-04 NOTE — Plan of Care (Signed)

## 2023-08-04 NOTE — Progress Notes (Signed)
9 Days Post-Op   Subjective/Chief Complaint: Doing well, a lot of flatus, ct yesterday   Objective: Vital signs in last 24 hours: Temp:  [97.7 F (36.5 C)-98.3 F (36.8 C)] 98.2 F (36.8 C) (12/14 0505) Pulse Rate:  [75-81] 76 (12/14 0642) Resp:  [17-18] 18 (12/14 0505) BP: (148-161)/(64-82) 148/64 (12/14 0642) SpO2:  [98 %-100 %] 98 % (12/14 0505) Weight:  [72.3 kg] 72.3 kg (12/14 0505) Last BM Date : 08/03/23  Intake/Output from previous day: 12/13 0701 - 12/14 0700 In: 2516.6 [I.V.:1499.8; IV Piggyback:1016.7] Out: 950 [Emesis/NG output:950] Intake/Output this shift: No intake/output data recorded.  Ab approp tender, vac in place, ng with minimal output this am and nonbilious  Lab Results:  Recent Labs    08/03/23 0335 08/04/23 0009  WBC 26.2* 26.5*  HGB 9.6* 9.2*  HCT 28.6* 26.8*  PLT 360 457*   BMET Recent Labs    08/03/23 0335 08/04/23 0009  NA 129* 128*  K 3.4* 3.7  CL 100 97*  CO2 22 24  GLUCOSE 125* 125*  BUN 19 17  CREATININE 0.37* 0.53  CALCIUM 9.7 9.4   PT/INR No results for input(s): "LABPROT", "INR" in the last 72 hours. ABG No results for input(s): "PHART", "HCO3" in the last 72 hours.  Invalid input(s): "PCO2", "PO2"  Studies/Results: CT ABDOMEN PELVIS W CONTRAST Result Date: 08/03/2023 CLINICAL DATA:  Abdominal pain, post-op evaluate for abscess formation EXAM: CT ABDOMEN AND PELVIS WITH CONTRAST TECHNIQUE: Multidetector CT imaging of the abdomen and pelvis was performed using the standard protocol following bolus administration of intravenous contrast. RADIATION DOSE REDUCTION: This exam was performed according to the departmental dose-optimization program which includes automated exposure control, adjustment of the mA and/or kV according to patient size and/or use of iterative reconstruction technique. CONTRAST:  OMNIPAQUE IOHEXOL 300 MG/ML SOLN, 30mL OMNIPAQUE IOHEXOL 300 MG/ML SOLN COMPARISON:  07/31/2023, 07/26/2023 FINDINGS:  Lower chest: Small bilateral pleural effusions with dependent bibasilar atelectasis or consolidation, similar to prior. Heart size within normal limits. Coronary artery atherosclerosis. Hepatobiliary: Mild hepatic steatosis. No focal liver lesion. Unremarkable gallbladder. No hyperdense gallstone. No biliary dilatation. Pancreas: Unremarkable. No pancreatic ductal dilatation or surrounding inflammatory changes. Spleen: Normal in size without focal abnormality. Adrenals/Urinary Tract: Unremarkable adrenal glands. Kidneys enhance symmetrically without focal lesion, stone, or hydronephrosis. Ureters are nondilated. Urinary bladder appears unremarkable for the degree of distention. Stomach/Bowel: Enteric tube extends into the distal gastric body. Stomach within normal limits. Postsurgical changes from small bowel resection. Mildly dilated loops of small bowel upstream from the anastomotic site slightly decreased in caliber compared to the prior study. There is circumferential wall thickening of the proximal jejunum (series 2, image 46). There is a small amount of residual contrast within the colon. No extraluminal contrast. Vascular/Lymphatic: Aortic atherosclerosis. No enlarged abdominal or pelvic lymph nodes. Reproductive: Status post hysterectomy. No adnexal masses. Other: Multiple intra-abdominal fluid collections with more well-defined rim enhancement compared to the prior CT. Elongated collection within the periphery of the right upper quadrant resulting in mass effect upon the right hepatic lobe measuring approximately 18.8 x 3.6 x 8.5 cm (volume = 300 cm^3)(series 5, image 45). Ovoid collection within the low pelvis at the midline measures 5.8 x 3.7 x 3.9 cm (volume = 44 cm^3) (series 2, image 74). Smaller collection along the peripheral margin of the spleen measures up to 7 cm (series 5, image 78). Small collection abutting the medial margin of the left hepatic lobe measures 3.2 x 1.6 cm (series 2,  image 32).  No free intraperitoneal air. Musculoskeletal: No new or acute bony findings. Lumbar dextrocurvature with multilevel spondylosis. IMPRESSION: 1. Multiple intra-abdominal fluid collections with more well-defined rim enhancement compared to the prior CT. Findings are concerning for abscesses. Largest is along the periphery of the right hepatic lobe and measures nearly 19 cm in length. 2. Mildly dilated loops of small bowel upstream from the anastomotic site slightly decreased in caliber compared to the prior study. Findings may represent a partial small bowel obstruction versus postoperative ileus. 3. There is circumferential wall thickening of the proximal jejunum compatible with enteritis. 4. Small bilateral pleural effusions with dependent bibasilar atelectasis or consolidation, similar to prior. 5. Aortic atherosclerosis (ICD10-I70.0). Electronically Signed   By: Duanne Guess D.O.   On: 08/03/2023 18:48   DG Abd Portable 1V Result Date: 08/03/2023 CLINICAL DATA:  [id number] Ileus, postoperative (HCC) [id number] EXAM: PORTABLE ABDOMEN - 1 VIEW COMPARISON:  07/31/2023 FINDINGS: Enteric tube terminates within the stomach. Slightly increasing size and number of dilated small bowel loops within the abdomen. No gross free intraperitoneal air on supine imaging. IMPRESSION: Slightly increasing size and number of dilated small bowel loops within the abdomen, which may represent ileus or small bowel obstruction. Electronically Signed   By: Duanne Guess D.O.   On: 08/03/2023 12:48   DG CHEST PORT 1 VIEW Result Date: 08/03/2023 CLINICAL DATA:  72 year old female with leukocytosis. Status post abdominal surgery, partial small-bowel resection for perforated closed loop obstruction. EXAM: PORTABLE CHEST 1 VIEW COMPARISON:  Chest radiographs 01/06/2018. CT Abdomen and Pelvis 07/31/2023. FINDINGS: Portable AP semi upright view at 0458 hours. Enteric tube courses to the abdomen, tip not included. Right side PICC line in  place, terminates a cavoatrial junction level. Low lung volumes. Probable ongoing small pleural effusion seen recently on CT. Mild lung base atelectasis. No pneumothorax, pulmonary edema, air bronchograms. Normal cardiac size and mediastinal contours. Paucity of bowel gas in the visible abdomen. No acute osseous abnormality identified. IMPRESSION: 1. Low lung volumes with small pleural effusions and  atelectasis. 2. Right PICC line with no adverse features. Enteric tube courses to the abdomen. Electronically Signed   By: Odessa Fleming M.D.   On: 08/03/2023 08:35    Anti-infectives: Anti-infectives (From admission, onward)    Start     Dose/Rate Route Frequency Ordered Stop   07/28/23 1330  piperacillin-tazobactam (ZOSYN) IVPB 3.375 g        3.375 g 12.5 mL/hr over 240 Minutes Intravenous Every 8 hours 07/28/23 1232     07/26/23 1745  piperacillin-tazobactam (ZOSYN) IVPB 3.375 g        3.375 g 12.5 mL/hr over 240 Minutes Intravenous  Once 07/26/23 1737 07/26/23 1931       Assessment/Plan: Closed loop SBO with ischemic small bowel and perforation  POD 9 s/p laparoscopy converted to open small bowel resection Dr. Maisie Fus - post-op ileus appears resolving - ct with likely IAA, consult IR - VAC M/W/F - clamp ng today - continue to mobilize as tolerated  Severe protein calorie malnutrition - PICC and TPN,  prealbumin 5 12/10   FEN: NPO, PICC/TPN VTE: LMWH ID: [id number] 12/5>>  Laura Bradshaw 08/04/2023

## 2023-08-04 NOTE — Progress Notes (Signed)
PROGRESS NOTE    Laura Bradshaw  QMV:[id number] DOB: Jul 06, 1951 DOA: 07/26/2023 PCP: Alberteen Sam, FNP   Brief Narrative:  Laura Bradshaw is a 72 y.o. female with PMH of HTN, anxiety, depression, COPD, HSV, low back pain and mild dementia presenting with acute abdominal pain and vomiting. She was found to have closed-loop bowel obstruction and ischemia, taken to the OR for laparoscopic, converted to open, small bowel resection with intraoperative findings of perforated closed loop obstruction with ischemic jejunum.  She subsequently underwent resection on 07/26/2023 by Dr. Maisie Fus and currently NG tube remains in place.  Abdominal CT done and below.  WBC continue to remain elevated so we will continue IV Zosyn.    Currently she is undergoing clamping trial again today since she is having more bowel movements.  Repeat CT scan done and showed intra-abdominal abscesses and IR has not been consulted.  ID recommends continue IV Zosyn and sending cultures appropriately.  Assessment and Plan:  Small bowel obstruction, perforation, jejunal ischemia s/p Resection and now with Ileus  -s/p resection 12/5 by Dr. Maisie Fus and is postoperative day 9.  -NG tube is in place and now surgery is attempting clamping trial and trialing her on a clear liquid diet this am and checking residual later today and if it is lower than the regular consider removing her NG tube  -Abd CT to r/o abscess per surgery is as below and she had a large bowel movement after the CT contrast yesterday but no further bowel function currently -Wound vac per surgery -Surg path pending. -Pain control as ordered. Limit narcotics as able but will change IV hydromorphone to IV morphine given that she tolerates this better -CT suggests ongoing ileus, and is now on TPN per general surgery -CT 12/10 as below -Per general surgery they are recommending repeating a CT scan since her WBC is not improving; her small bowel obstruction is  improving though given that she has had multiple bowel movements overnight which are loose and she feels like she has less pressure in her abdomen -PT/OT recommending Home Health PT/OT when stable for D/C   Hypokalemia -Likely to be replaced with TPN -Patient's K+ Level Trend: Recent Labs  Lab 07/29/23 0750 07/30/23 0500 07/31/23 0447 08/01/23 0349 08/02/23 0418 08/03/23 0335 08/04/23 0009  K 3.7 2.6* 3.7 3.5 3.1* 3.4* 3.7  -Replete with TPN -Continue to Monitor and Replete as Necessary -Repeat CMP in the AM   Hyponatremia, worsening  -Na+ Trend: Recent Labs  Lab 07/29/23 0750 07/30/23 0500 07/31/23 0447 08/01/23 0349 08/02/23 0418 08/03/23 0335 08/04/23 0009  NA 141 138 140 132* 134* 129* 128*  -Continue to Monitor and Trend and Repeat CMP in the AM  .  HTN -Stable blood pressures have been remaining elevated. -Was going to resume her lisinopril but she remains strict n.p.o. given general surgery recommendations and will add IV hydralazine 10 mg every 6 as needed for SBP greater than 160 or DBP greater than 100 -Continue to monitor blood pressures per protocol -Last blood pressure reading was 158/67   Anxiety and Depression -Stable -Resume home meds when able to absorb anything po but remains Strict NPO for now until CT Scan results    COPD -Continue controller med and prn albuterol neb.  -Decreased rate of IVF given small pleural effusions -Encourage IS (at bedside) SpO2: 97 % O2 Flow Rate (L/min): 2 L/min -See CXR as below; Repeat CXR in the AM    Mild Hypercalcemia -Likely due  to dehydration, resolved. -Ca2+ Trend: Recent Labs  Lab 07/29/23 0750 07/30/23 0500 07/31/23 0447 08/01/23 0349 08/02/23 0418 08/03/23 0335 08/04/23 0009  CALCIUM 9.8 10.5* 10.1 9.3 9.6 9.7 9.4  -Continue to Monitor and Trend and repeat CBC in the MAM   Leukocytosis, worsening in the setting of Intraabdominal Abscesses  -Initially Resolved, then recurrent postoperatively  and now worsened significantly.  -WBC Trend: Recent Labs  Lab 07/29/23 0750 07/30/23 0500 07/31/23 0447 08/01/23 0349 08/02/23 1120 08/03/23 0335 08/04/23 0009  WBC 15.4* 17.2* 14.9* 15.5* 22.8* 26.2* 26.5*  -Restarted IV Zosyn on 12/7.  -Blood cultures negative from 12/5. No operative culture sent. -Blood cultures are NGTD at 5 Days on 07/26/23 but will repeat Blood Cx on 08/02/23 given that WBC is worsened as well as a Urinalysis; repeat urinalysis unremarkable -CT Abd/Pelvis with Contrast 07/31/23 done and showed "Mild distension of the stomach, duodenum and proximal jejunum with scattered air-fluid levels, likely postoperative ileus. No evidence of contrast leak. Small to moderate amount of ascites has mildly increased in volume compared with the previous CT. There is mild adjacent thin peritoneal enhancement without organized fluid collection to suggest an abscess. New small bilateral pleural effusions with dependent airspace opacities at both lung bases, likely atelectasis. No acute vascular findings. Aortic Atherosclerosis" -Repeat CT Abd/Pelvis with Contrast on 08/03/2023 done and showed "Multiple intra-abdominal fluid collections with more well-defined rim enhancement compared to the prior CT. Findings are concerning for abscesses. Largest is along the periphery of the right hepatic lobe and measures nearly 19 cm in length. Mildly dilated loops of small bowel upstream from the anastomotic site slightly decreased in caliber compared to the prior study. Findings may represent a partial small bowel obstruction versus postoperative ileus. There is circumferential wall thickening of the proximal jejunum compatible with enteritis. Small bilateral pleural effusions with dependent bibasilar atelectasis or consolidation, similar to prior. Aortic atherosclerosis (ICD10-I70.0)."  -CXR 12/13 done and showed "Low lung volumes with small pleural effusions and  atelectasis. Right PICC line with no adverse  features. Enteric tube courses to the abdomen." -Continue to Monitor for S/Sx of Infection and repeat CBC in the AM and repeat CXR in the AM -Consulted ID for further evaluation recommendations given her significant rise in WBC and recommending continuing IV Zosyn and recommending sending samples for aerobic and anaerobic cultures as appropriate -Interventional radiology has now been consulted and plan is for CT-guided aspiration and possible drain placement by Dr. Deanne Coffer on Monday, 08/06/2023 where she will need to be n.p.o. and obtain an INR on Sunday and hold her Lovenox on Monday  Abnormal LFTs in setting of Abscess along the periphery of R Hepatic Lobe -Worsening and unclear etiology -LFT Trend: Recent Labs  Lab 07/26/23 1358 07/29/23 0750 08/02/23 0418 08/03/23 0335 08/04/23 0009  AST 31 16 40 73* 49*  ALT 41 18 55* 82* 81*  BILITOT 0.7 0.7 1.1 1.0 0.6  ALKPHOS 70 41 219* 244* 230*  -Continue to monitor and trend and repeat CMP in the a.m. if necessary will obtain her acute hepatitis panel and further workup   Hyperglycemia in the setting of Prediabetes -HbA1c 6.1%.  -C/w Sensitive Novolog SSI q6h -CBG Trend: Recent Labs  Lab 08/03/23 0636 08/03/23 0744 08/03/23 1126 08/03/23 1531 08/03/23 1751 08/04/23 0013 08/04/23 0541  GLUCAP 133* 119* 122* 100* 106* 133* 125*  -Outpatient follow up recommended.   Hypophosphatemia -Phos Level Trend: Recent Labs  Lab 07/27/23 0253 08/01/23 0349 08/02/23 0418 08/03/23 0335 08/04/23 0009  PHOS 3.1 2.4* 3.2 2.7 2.8  -Continue to Monitor and Replete as Necessary -Check Phos Level in the AM  Normocytic Anemia -Hgb/Hct Trend: Recent Labs  Lab 07/29/23 0750 07/30/23 0500 07/31/23 0447 08/01/23 0349 08/02/23 1120 08/03/23 0335 08/04/23 0009  HGB 10.5* 11.2* 10.4* 9.6* 9.8* 9.6* 9.2*  HCT 33.3* 34.6* 31.5* 29.2* 29.3* 28.6* 26.8*  MCV 99.4 97.2 96.3 96.1 95.8 95.3 94.7  -Checked Anemia Panel and showed an iron level of  37, UIBC 250, TIBC 287, saturation ratios of 13%, ferritin level of 188, folate level 11.2 and vitamin B12 368 -Continue to Monitor for S/Sx of Bleeding; no overt Bleeding noted -Repeat CBC in the AM   Moderate Protein Calorie Malnutrition Nutrition Status: Nutrition Problem: Moderate Malnutrition Etiology: acute illness Signs/Symptoms: mild fat depletion, mild muscle depletion, energy intake < 75% for > 7 days Interventions: TPN -Prealbumin was 5 -Nutritionist following and recommending TPN management per pharmacy and recommending 100 mg thiamine daily for 5 days and checking daily weights while on TPN  Hypoalbuminemia -Patient's Albumin Trend: Recent Labs  Lab 07/26/23 1358 07/27/23 0253 07/29/23 0750 08/02/23 0418 08/03/23 0335 08/04/23 0009  ALBUMIN 4.5 3.7 2.6* 2.2* 2.3* 2.2*  -Continue to Monitor and Trend and repeat CMP in the AM   DVT prophylaxis: enoxaparin (LOVENOX) injection 30 mg Start: 07/27/23 0800 SCD's Start: 07/26/23 2150 SCDs Start: 07/26/23 2137    Code Status: Full Code Family Communication: With the patient's daughter at bedside  Disposition Plan:  Level of care: Telemetry Status is: Inpatient Remains inpatient appropriate because: Needs further clinical improvement and clearance by the specialists   Consultants:  General Surgery Interventional Radiology Infectious Diseases  Procedures:  Delineated as above LAPAROSCOPY DIAGNOSTIC CONVERTED TO OPEN SMALL BOWEL RESECTION   Antimicrobials:  Anti-infectives (From admission, onward)    Start     Dose/Rate Route Frequency Ordered Stop   07/28/23 1330  piperacillin-tazobactam (ZOSYN) IVPB 3.375 g        3.375 g 12.5 mL/hr over 240 Minutes Intravenous Every 8 hours 07/28/23 1232     07/26/23 1745  piperacillin-tazobactam (ZOSYN) IVPB 3.375 g        3.375 g 12.5 mL/hr over 240 Minutes Intravenous  Once 07/26/23 1737 07/26/23 1931       Subjective: Seen and examined at bedside she was resting  and had no complaints.  Passing some flatus.  Has multiple abscesses noted so interventional radiology has not been consulted.  Feels okay and NG tube is in place.  Wanting to sleep.  No other concerns or complaints this time.  Objective: Vitals:   08/03/23 2000 08/04/23 0505 08/04/23 0642 08/04/23 0838  BP: (!) 150/68 (!) 161/82 (!) 148/64   Pulse: 81 80 76   Resp:  18    Temp: 98.3 F (36.8 C) 98.2 F (36.8 C)    TempSrc: Oral Oral    SpO2: 100% 98%  97%  Weight:  72.3 kg    Height:        Intake/Output Summary (Last 24 hours) at 08/04/2023 1336 Last data filed at 08/04/2023 1100 Gross per 24 hour  Intake 2516.55 ml  Output 950 ml  Net 1566.55 ml   Filed Weights   07/26/23 1342 08/03/23 0500 08/04/23 0505  Weight: 67.6 kg 74 kg 72.3 kg   Examination: Physical Exam:  Constitutional: WN/WD obese chronically ill-appearing Caucasian female in no acute distress resting Respiratory: Diminished to auscultation bilaterally with some coarse breath sounds, no wheezing, rales, rhonchi or crackles. Normal  respiratory effort and patient is not tachypenic. No accessory muscle use.  Unlabored breathing Cardiovascular: RRR, no murmurs / rubs / gallops. S1 and S2 auscultated. No extremity edema.  Abdomen: Soft, slightly tender and distended secondary to body habitus.  Bowel sounds present and diminished.  GU: Deferred. Musculoskeletal: No clubbing / cyanosis of digits/nails. No joint deformity upper and lower extremities.  Skin: No rashes, lesions, ulcers on limited skin evaluation. No induration; Warm and dry.  Neurologic: She is hard of hearing without her hearing aids  Psychiatric: Sleepy but easily arousable and has a pleasant mood and affect  Data Reviewed: I have personally reviewed following labs and imaging studies  CBC: Recent Labs  Lab 07/31/23 0447 08/01/23 0349 08/02/23 1120 08/03/23 0335 08/04/23 0009  WBC 14.9* 15.5* 22.8* 26.2* 26.5*  NEUTROABS  --   --  17.6*  20.6* 21.5*  HGB 10.4* 9.6* 9.8* 9.6* 9.2*  HCT 31.5* 29.2* 29.3* 28.6* 26.8*  MCV 96.3 96.1 95.8 95.3 94.7  PLT 277 254 313 360 457*   Basic Metabolic Panel: Recent Labs  Lab 07/31/23 0447 08/01/23 0349 08/02/23 0418 08/03/23 0335 08/04/23 0009  NA 140 132* 134* 129* 128*  K 3.7 3.5 3.1* 3.4* 3.7  CL 104 101 102 100 97*  CO2 26 24 24 22 24   GLUCOSE 123* 126* 124* 125* 125*  BUN 19 17 16 19 17   CREATININE 0.70 0.53 <0.30* 0.37* 0.53  CALCIUM 10.1 9.3 9.6 9.7 9.4  MG  --  2.2 2.1 2.3 2.3  PHOS  --  2.4* 3.2 2.7 2.8   GFR: Estimated Creatinine Clearance: 57 mL/min (by C-G formula based on SCr of 0.53 mg/dL). Liver Function Tests: Recent Labs  Lab 07/29/23 0750 08/02/23 0418 08/03/23 0335 08/04/23 0009  AST 16 40 73* 49*  ALT 18 55* 82* 81*  ALKPHOS 41 219* 244* 230*  BILITOT 0.7 1.1 1.0 0.6  PROT 5.6* 5.8* 6.1* 5.9*  ALBUMIN 2.6* 2.2* 2.3* 2.2*   No results for input(s): "LIPASE", "AMYLASE" in the last 168 hours. No results for input(s): "AMMONIA" in the last 168 hours. Coagulation Profile: No results for input(s): "INR", "PROTIME" in the last 168 hours. Cardiac Enzymes: No results for input(s): "CKTOTAL", "CKMB", "CKMBINDEX", "TROPONINI" in the last 168 hours. BNP (last 3 results) No results for input(s): "PROBNP" in the last 8760 hours. HbA1C: No results for input(s): "HGBA1C" in the last 72 hours. CBG: Recent Labs  Lab 08/03/23 1126 08/03/23 1531 08/03/23 1751 08/04/23 0013 08/04/23 0541  GLUCAP 122* 100* 106* 133* 125*   Lipid Profile: No results for input(s): "CHOL", "HDL", "LDLCALC", "TRIG", "CHOLHDL", "LDLDIRECT" in the last 72 hours. Thyroid Function Tests: No results for input(s): "TSH", "T4TOTAL", "FREET4", "T3FREE", "THYROIDAB" in the last 72 hours. Anemia Panel: Recent Labs    08/03/23 0335  VITAMINB12 368  FOLATE 11.2  FERRITIN 188  TIBC 287  IRON 37  RETICCTPCT 2.2   Sepsis Labs: No results for input(s): "PROCALCITON",  "LATICACIDVEN" in the last 168 hours.  Recent Results (from the past 240 hours)  Blood culture (routine x 2)     Status: None   Collection Time: 07/26/23  9:16 PM   Specimen: BLOOD  Result Value Ref Range Status   Specimen Description   Final    BLOOD BLOOD RIGHT HAND Performed at [facility], [address], [place], [place] [address]    Special Requests   Final    BOTTLES DRAWN AEROBIC AND ANAEROBIC Blood Culture results may  not be optimal due to an inadequate volume of blood received in culture bottles Performed at [facility], [address], [place], [place] [address]    Culture   Final    NO GROWTH 5 DAYS Performed at [facility], [address] [place], [place] [address]    Report Status 07/31/2023 FINAL  Final  Blood culture (routine x 2)     Status: None   Collection Time: 07/26/23  9:16 PM   Specimen: BLOOD  Result Value Ref Range Status   Specimen Description   Final    BLOOD RIGHT ANTECUBITAL Performed at [facility], [address], [place], [place] [address]    Special Requests   Final    BOTTLES DRAWN AEROBIC AND ANAEROBIC Blood Culture adequate volume Performed at [facility], [address], [place], [place] [address]    Culture   Final    NO GROWTH 5 DAYS Performed at [facility], [address], [place], [place] [address]    Report Status 07/31/2023 FINAL  Final  Culture, blood (Routine X 2) w Reflex to ID Panel     Status: None (Preliminary result)   Collection Time: 08/02/23  4:08 PM   Specimen: BLOOD LEFT ARM  Result Value Ref Range Status   Specimen Description   Final    BLOOD LEFT ARM Performed at [facility], [address], [place], [place] [address]    Special Requests   Final    BOTTLES DRAWN AEROBIC AND ANAEROBIC Blood Culture results may not be optimal due to an inadequate volume of blood received in culture bottles Performed at [facility], [address], [place], [place] [address]    Culture   Final    NO GROWTH < 24 HOURS Performed at [facility], [address], [place], [place] [address]    Report Status PENDING  Incomplete  Culture, blood (Routine X 2) w Reflex to ID Panel     Status: None (Preliminary result)   Collection Time: 08/02/23  4:16 PM   Specimen: BLOOD RIGHT HAND  Result Value Ref Range Status   Specimen Description   Final    BLOOD RIGHT HAND Performed at [facility], [address] [place], [place] [address]    Special Requests   Final    BOTTLES DRAWN AEROBIC AND ANAEROBIC Blood Culture results may not be optimal due to an inadequate volume of blood received in culture bottles Performed at [facility], [address], [place], [place] [address]    Culture   Final    NO GROWTH < 24 HOURS Performed at [facility], [address] [place], [place] [address]    Report Status PENDING  Incomplete    Radiology Studies: CT ABDOMEN PELVIS W CONTRAST Result Date: 08/03/2023 CLINICAL DATA:  Abdominal pain, post-op evaluate for abscess formation EXAM: CT ABDOMEN AND PELVIS WITH CONTRAST TECHNIQUE: Multidetector CT imaging of the abdomen and pelvis was performed using the standard protocol following bolus administration of intravenous contrast. RADIATION DOSE REDUCTION: This exam was performed according to the departmental dose-optimization program which includes automated exposure control, adjustment of the mA and/or kV according to patient size and/or use of iterative reconstruction technique. CONTRAST:  OMNIPAQUE IOHEXOL 300 MG/ML SOLN, 30mL OMNIPAQUE IOHEXOL 300 MG/ML SOLN COMPARISON:  07/31/2023, 07/26/2023 FINDINGS: Lower chest: Small bilateral pleural effusions with dependent bibasilar atelectasis or  consolidation, similar to prior. Heart size within normal limits. Coronary artery atherosclerosis. Hepatobiliary: Mild hepatic steatosis.  No focal liver lesion. Unremarkable gallbladder. No hyperdense gallstone. No biliary dilatation. Pancreas: Unremarkable. No pancreatic ductal dilatation or surrounding inflammatory changes. Spleen: Normal in size without focal abnormality. Adrenals/Urinary Tract: Unremarkable adrenal glands. Kidneys enhance symmetrically without focal lesion, stone, or hydronephrosis. Ureters are nondilated. Urinary bladder appears unremarkable for the degree of distention. Stomach/Bowel: Enteric tube extends into the distal gastric body. Stomach within normal limits. Postsurgical changes from small bowel resection. Mildly dilated loops of small bowel upstream from the anastomotic site slightly decreased in caliber compared to the prior study. There is circumferential wall thickening of the proximal jejunum (series 2, image 46). There is a small amount of residual contrast within the colon. No extraluminal contrast. Vascular/Lymphatic: Aortic atherosclerosis. No enlarged abdominal or pelvic lymph nodes. Reproductive: Status post hysterectomy. No adnexal masses. Other: Multiple intra-abdominal fluid collections with more well-defined rim enhancement compared to the prior CT. Elongated collection within the periphery of the right upper quadrant resulting in mass effect upon the right hepatic lobe measuring approximately 18.8 x 3.6 x 8.5 cm (volume = 300 cm^3)(series 5, image 45). Ovoid collection within the low pelvis at the midline measures 5.8 x 3.7 x 3.9 cm (volume = 44 cm^3) (series 2, image 74). Smaller collection along the peripheral margin of the spleen measures up to 7 cm (series 5, image 78). Small collection abutting the medial margin of the left hepatic lobe measures 3.2 x 1.6 cm (series 2, image 32). No free intraperitoneal air. Musculoskeletal: No new or acute bony findings. Lumbar dextrocurvature with multilevel spondylosis. IMPRESSION: 1. Multiple intra-abdominal fluid collections with more well-defined rim enhancement  compared to the prior CT. Findings are concerning for abscesses. Largest is along the periphery of the right hepatic lobe and measures nearly 19 cm in length. 2. Mildly dilated loops of small bowel upstream from the anastomotic site slightly decreased in caliber compared to the prior study. Findings may represent a partial small bowel obstruction versus postoperative ileus. 3. There is circumferential wall thickening of the proximal jejunum compatible with enteritis. 4. Small bilateral pleural effusions with dependent bibasilar atelectasis or consolidation, similar to prior. 5. Aortic atherosclerosis (ICD10-I70.0). Electronically Signed   By: Duanne Guess D.O.   On: 08/03/2023 18:48   DG Abd Portable 1V Result Date: 08/03/2023 CLINICAL DATA:  [id number] Ileus, postoperative (HCC) [id number] EXAM: PORTABLE ABDOMEN - 1 VIEW COMPARISON:  07/31/2023 FINDINGS: Enteric tube terminates within the stomach. Slightly increasing size and number of dilated small bowel loops within the abdomen. No gross free intraperitoneal air on supine imaging. IMPRESSION: Slightly increasing size and number of dilated small bowel loops within the abdomen, which may represent ileus or small bowel obstruction. Electronically Signed   By: Duanne Guess D.O.   On: 08/03/2023 12:48   DG CHEST PORT 1 VIEW Result Date: 08/03/2023 CLINICAL DATA:  72 year old female with leukocytosis. Status post abdominal surgery, partial small-bowel resection for perforated closed loop obstruction. EXAM: PORTABLE CHEST 1 VIEW COMPARISON:  Chest radiographs 01/06/2018. CT Abdomen and Pelvis 07/31/2023. FINDINGS: Portable AP semi upright view at 0458 hours. Enteric tube courses to the abdomen, tip not included. Right side PICC line in place, terminates a cavoatrial junction level. Low lung volumes. Probable ongoing small pleural effusion seen recently on CT. Mild lung base atelectasis. No pneumothorax, pulmonary edema, air bronchograms. Normal cardiac size and  mediastinal contours. Paucity of bowel gas in the visible abdomen. No acute  osseous abnormality identified. IMPRESSION: 1. Low lung volumes with small pleural effusions and  atelectasis. 2. Right PICC line with no adverse features. Enteric tube courses to the abdomen. Electronically Signed   By: Odessa Fleming M.D.   On: 08/03/2023 08:35   Scheduled Meds:  Chlorhexidine Gluconate Cloth  6 each Topical Daily   enoxaparin (LOVENOX) injection  30 mg Subcutaneous Q24H   escitalopram  20 mg Oral Daily   gabapentin  300 mg Oral TID   insulin aspart  0-9 Units Subcutaneous Q6H   lisinopril  10 mg Oral QHS   mometasone-formoterol  2 puff Inhalation BID   montelukast  10 mg Oral QHS   nicotine  21 mg Transdermal Daily   simvastatin  40 mg Oral Daily   sodium chloride flush  10-40 mL Intracatheter Q12H   thiamine (VITAMIN B1) injection  100 mg Intravenous Daily   Continuous Infusions:  acetaminophen 1,000 mg (08/04/23 1329)   dextrose     dextrose 5 % and 0.45 % NaCl with KCl 40 mEq/L 50 mL/hr at 08/03/23 1723   TPN (CLINIMIX-E) Adult     And   fat emul(SMOFlipid)     piperacillin-tazobactam (ZOSYN)  IV 3.375 g (08/04/23 0506)   TPN (CLINIMIX-E) Adult 40 mL/hr at 08/03/23 1710    LOS: 9 days   Marguerita Merles, DO [facility] Available via Epic secure chat 7am-7pm After these hours, please refer to coverage provider listed on [email or web address] 08/04/2023, 1:36 PM

## 2023-08-04 NOTE — Consult Note (Signed)
Chief Complaint: Patient was seen in consultation today for intraabdominal fluid collection Chief Complaint  Patient presents with   Abdominal Pain        at the request of Emelia Loron   Referring Physician(s):  Emelia Loron   Supervising Physician: Oley Balm  Patient Status: [facility] - In-pt  History of Present Illness: RILDA NORWAY is a 72 y.o. female with PMHs of HTN, anxiety, depression, COPD, HSV, low back pain who was found to have SBO, s/p open small bowel resection on 07/26/23, IR was consulted for possible drain placement for the intraabdominal abscess seen on f/u CT.   Patient presented to ED on 07/26/23 with acute abdominal pain, CT showed closed loop obstruction of several loops of distal small bowel. Patient underwent laparoscopic, converted to open, small bowel resection with intraoperative findings of perforated closed loop obstruction with ischemic jejunum. Patient was placed on TPN and NGT for suction for post op ileus. VS has been stable but WBC count has been trending up, follow up CT A/P with contrast on 08/03/23 showed:   1. Multiple intra-abdominal fluid collections with more well-defined rim enhancement compared to the prior CT. Findings are concerning for abscesses. Largest is along the periphery of the right hepatic lobe and measures nearly 19 cm in length. 2. Mildly dilated loops of small bowel upstream from the anastomotic site slightly decreased in caliber compared to the prior study. Findings may represent a partial small bowel obstruction versus postoperative ileus. 3. There is circumferential wall thickening of the proximal jejunum compatible with enteritis. 4. Small bilateral pleural effusions with dependent bibasilar atelectasis or consolidation, similar to prior. 5. Aortic atherosclerosis (ICD10-I70.0).  IR was consulted for possible drain placement, case reviewed and approved for CT guided aspiration and possible drain  placement by Dr. Deanne Coffer.   Patient laying in bed, not in acute distress. Daughter at bedside,  Angelique Blonder headache, fever, chills, shortness of breath, cough, chest pain, abdominal pain, nausea ,vomiting, and bleeding.  Patient and daughter were informed that the procedure is scheduled for Monday between 8 am and 4 pm.  Bot verbalized understanding.   Past Medical History:  Diagnosis Date   Anxiousness    Back pain    Diverticulitis    Environmental allergies    HOH (hard of hearing)    Hypercholesteremia    Hypertension    Restless leg     Past Surgical History:  Procedure Laterality Date   ABDOMINAL HYSTERECTOMY     APPENDECTOMY     LAPAROSCOPY N/A 07/26/2023   Procedure: LAPAROSCOPY DIAGNOSTIC CONVERTED TO OPEN BOWEL RESECTION;  Surgeon: Romie Levee, MD;  Location: [facility];  Service: General;  Laterality: N/A;   SHOULDER SURGERY     TONSILLECTOMY      Allergies: Ciprofloxacin, Codeine, Sulfonamide derivatives, and Hydrocodone  Medications: Prior to Admission medications   Medication Sig Start Date End Date Taking? Authorizing Provider  acetaminophen (TYLENOL) 500 MG tablet Take 1,000 mg by mouth as needed for moderate pain (pain score 4-6).   Yes [provider]  albuterol (VENTOLIN HFA) 108 (90 Base) MCG/ACT inhaler Inhale 2 puffs into the lungs every 4 (four) hours as needed for wheezing or shortness of breath. 07/25/23  Yes [provider]  ALPRAZolam Prudy Feeler) 0.5 MG tablet Take 0.5 mg by mouth in the morning and at bedtime.   Yes [provider]  aspirin 325 MG tablet Take 325 mg by mouth daily.   Yes [provider]  cyclobenzaprine (FLEXERIL) 10  MG tablet Take 10 mg by mouth at bedtime. 06/04/23  Yes [provider]  diclofenac (VOLTAREN) 75 MG EC tablet Take 75 mg by mouth 3 (three) times daily.   Yes [provider]  escitalopram (LEXAPRO) 20 MG tablet Take 20 mg by mouth daily.   Yes [provider]   fluticasone (FLONASE) 50 MCG/ACT nasal spray Place 2 sprays into both nostrils as needed for allergies. 07/25/23  Yes [provider]  fluticasone-salmeterol (ADVAIR) 250-50 MCG/ACT AEPB Inhale 2 puffs into the lungs as needed (wheezing/SOB). 07/25/23  Yes [provider]  gabapentin (NEURONTIN) 300 MG capsule Take 300 mg by mouth 3 (three) times daily.   Yes [provider]  hydrochlorothiazide (HYDRODIURIL) 25 MG tablet Take 25 mg by mouth daily.   Yes [provider]  lisinopril (ZESTRIL) 10 MG tablet Take 10 mg by mouth at bedtime.   Yes [provider]  montelukast (SINGULAIR) 10 MG tablet Take 10 mg by mouth at bedtime.   Yes [provider]  simvastatin (ZOCOR) 40 MG tablet Take 40 mg by mouth daily.   Yes [provider]  Sodium Citrate Dihydrate (EMETROL) 230 MG CHEW Chew 460 mg by mouth as needed (nausea).   Yes [provider]  naproxen (NAPROSYN) 500 MG tablet Take 500 mg by mouth 2 (two) times daily with a meal. Patient not taking: Reported on 07/27/2023    [provider]     History reviewed. No pertinent family history.  Social History   Socioeconomic History   Marital status: Single    Spouse name: Not on file   Number of children: Not on file   Years of education: Not on file   Highest education level: Not on file  Occupational History   Not on file  Tobacco Use   Smoking status: Every Day    Types: Cigarettes   Smokeless tobacco: Never  Vaping Use   Vaping status: Never Used  Substance and Sexual Activity   Alcohol use: Never   Drug use: Never   Sexual activity: Not on file  Other Topics Concern   Not on file  Social History Narrative   Not on file   Social Drivers of Health   Financial Resource Strain: Low Risk  (06/28/2023)   Received from [facility]   Overall Financial Resource Strain (CARDIA)    Difficulty of Paying Living Expenses: Not very hard  Food Insecurity: No  Food Insecurity (07/29/2023)   Hunger Vital Sign    Worried About Running Out of Food in the Last Year: Never true    Ran Out of Food in the Last Year: Never true  Transportation Needs: No Transportation Needs (07/29/2023)   PRAPARE - Administrator, Civil Service (Medical): No    Lack of Transportation (Non-Medical): No  Physical Activity: Unknown (06/28/2023)   [facility] from [facility]   Exercise Vital Sign    Days of Exercise per Week: 0 days    Minutes of Exercise per Session: Not on file  Stress: Stress Concern Present (06/28/2023)   Received from [facility] - Occupational Stress Questionnaire    Feeling of Stress : Rather much  Social Connections: Somewhat Isolated (06/28/2023)   Received from [facility]   Social Network    How would you rate your social network (family, work, friends)?: Restricted participation with some degree of social isolation     Review of Systems: A 12  point ROS discussed and pertinent positives are indicated in the HPI above.  All other systems are negative.  Vital Signs: BP (!) 148/64 (BP Location: Left Arm)   Pulse 76   Temp 98.2 F (36.8 C) (Oral)   Resp 18   Ht 5\' 1"  (1.549 m)   Wt 159 lb 6.3 oz (72.3 kg)   SpO2 97%   BMI 30.12 kg/m    Physical Exam Vitals reviewed.  Constitutional:      General: She is not in acute distress.    Appearance: She is not ill-appearing.  HENT:     Head: Normocephalic and atraumatic.     Comments: + NGT     Mouth/Throat:     Pharynx: Oropharynx is clear.  Cardiovascular:     Rate and Rhythm: Normal rate and regular rhythm.     Heart sounds: Normal heart sounds.  Pulmonary:     Effort: Pulmonary effort is normal.     Breath sounds: Normal breath sounds.  Abdominal:     General: Abdomen is flat. Bowel sounds are decreased.     Palpations: Abdomen is soft.  Skin:    General: Skin is warm and dry.     Coloration: Skin is not cyanotic or  jaundiced.  Neurological:     Mental Status: She is alert and oriented to person, place, and time.  Psychiatric:        Mood and Affect: Mood normal.        Behavior: Behavior normal.     MD Evaluation Airway: WNL Heart: WNL Abdomen: WNL Chest/ Lungs: WNL ASA  Classification: 3 Mallampati/Airway Score: One  Imaging: CT ABDOMEN PELVIS W CONTRAST Result Date: 08/03/2023 CLINICAL DATA:  Abdominal pain, post-op evaluate for abscess formation EXAM: CT ABDOMEN AND PELVIS WITH CONTRAST TECHNIQUE: Multidetector CT imaging of the abdomen and pelvis was performed using the standard protocol following bolus administration of intravenous contrast. RADIATION DOSE REDUCTION: This exam was performed according to the departmental dose-optimization program which includes automated exposure control, adjustment of the mA and/or kV according to patient size and/or use of iterative reconstruction technique. CONTRAST:  OMNIPAQUE IOHEXOL 300 MG/ML SOLN, 30mL OMNIPAQUE IOHEXOL 300 MG/ML SOLN COMPARISON:  07/31/2023, 07/26/2023 FINDINGS: Lower chest: Small bilateral pleural effusions with dependent bibasilar atelectasis or consolidation, similar to prior. Heart size within normal limits. Coronary artery atherosclerosis. Hepatobiliary: Mild hepatic steatosis. No focal liver lesion. Unremarkable gallbladder. No hyperdense gallstone. No biliary dilatation. Pancreas: Unremarkable. No pancreatic ductal dilatation or surrounding inflammatory changes. Spleen: Normal in size without focal abnormality. Adrenals/Urinary Tract: Unremarkable adrenal glands. Kidneys enhance symmetrically without focal lesion, stone, or hydronephrosis. Ureters are nondilated. Urinary bladder appears unremarkable for the degree of distention. Stomach/Bowel: Enteric tube extends into the distal gastric body. Stomach within normal limits. Postsurgical changes from small bowel resection. Mildly dilated loops of small bowel upstream from the  anastomotic site slightly decreased in caliber compared to the prior study. There is circumferential wall thickening of the proximal jejunum (series 2, image 46). There is a small amount of residual contrast within the colon. No extraluminal contrast. Vascular/Lymphatic: Aortic atherosclerosis. No enlarged abdominal or pelvic lymph nodes. Reproductive: Status post hysterectomy. No adnexal masses. Other: Multiple intra-abdominal fluid collections with more well-defined rim enhancement compared to the prior CT. Elongated collection within the periphery of the right upper quadrant resulting in mass effect upon the right hepatic lobe measuring approximately 18.8 x 3.6 x 8.5 cm (volume = 300 cm^3)(series 5, image 45). Ovoid collection within the  low pelvis at the midline measures 5.8 x 3.7 x 3.9 cm (volume = 44 cm^3) (series 2, image 74). Smaller collection along the peripheral margin of the spleen measures up to 7 cm (series 5, image 78). Small collection abutting the medial margin of the left hepatic lobe measures 3.2 x 1.6 cm (series 2, image 32). No free intraperitoneal air. Musculoskeletal: No new or acute bony findings. Lumbar dextrocurvature with multilevel spondylosis. IMPRESSION: 1. Multiple intra-abdominal fluid collections with more well-defined rim enhancement compared to the prior CT. Findings are concerning for abscesses. Largest is along the periphery of the right hepatic lobe and measures nearly 19 cm in length. 2. Mildly dilated loops of small bowel upstream from the anastomotic site slightly decreased in caliber compared to the prior study. Findings may represent a partial small bowel obstruction versus postoperative ileus. 3. There is circumferential wall thickening of the proximal jejunum compatible with enteritis. 4. Small bilateral pleural effusions with dependent bibasilar atelectasis or consolidation, similar to prior. 5. Aortic atherosclerosis (ICD10-I70.0). Electronically Signed   By: Duanne Guess D.O.   On: 08/03/2023 18:48   DG Abd Portable 1V Result Date: 08/03/2023 CLINICAL DATA:  [id number] Ileus, postoperative (HCC) [id number] EXAM: PORTABLE ABDOMEN - 1 VIEW COMPARISON:  07/31/2023 FINDINGS: Enteric tube terminates within the stomach. Slightly increasing size and number of dilated small bowel loops within the abdomen. No gross free intraperitoneal air on supine imaging. IMPRESSION: Slightly increasing size and number of dilated small bowel loops within the abdomen, which may represent ileus or small bowel obstruction. Electronically Signed   By: Duanne Guess D.O.   On: 08/03/2023 12:48   DG CHEST PORT 1 VIEW Result Date: 08/03/2023 CLINICAL DATA:  72 year old female with leukocytosis. Status post abdominal surgery, partial small-bowel resection for perforated closed loop obstruction. EXAM: PORTABLE CHEST 1 VIEW COMPARISON:  Chest radiographs 01/06/2018. CT Abdomen and Pelvis 07/31/2023. FINDINGS: Portable AP semi upright view at 0458 hours. Enteric tube courses to the abdomen, tip not included. Right side PICC line in place, terminates a cavoatrial junction level. Low lung volumes. Probable ongoing small pleural effusion seen recently on CT. Mild lung base atelectasis. No pneumothorax, pulmonary edema, air bronchograms. Normal cardiac size and mediastinal contours. Paucity of bowel gas in the visible abdomen. No acute osseous abnormality identified. IMPRESSION: 1. Low lung volumes with small pleural effusions and  atelectasis. 2. Right PICC line with no adverse features. Enteric tube courses to the abdomen. Electronically Signed   By: Odessa Fleming M.D.   On: 08/03/2023 08:35   CT ABDOMEN PELVIS W CONTRAST Result Date: 07/31/2023 CLINICAL DATA:  Abdominal pain, post-op. Patient underwent abdominal laparoscopy and partial small bowel resection for perforated closed loop bowel obstruction and jejunal ischemia on 07/26/2023. EXAM: CT ABDOMEN AND PELVIS WITH CONTRAST TECHNIQUE: Multidetector CT  imaging of the abdomen and pelvis was performed using the standard protocol following bolus administration of intravenous contrast. RADIATION DOSE REDUCTION: This exam was performed according to the departmental dose-optimization program which includes automated exposure control, adjustment of the mA and/or kV according to patient size and/or use of iterative reconstruction technique. CONTRAST:  OMNIPAQUE IOHEXOL 300 MG/ML  SOLN COMPARISON:  Abdominopelvic CT 07/26/2023 FINDINGS: Lower chest: New small bilateral pleural effusions with dependent airspace opacities at both lung bases, right greater than left, likely atelectasis. No basilar pneumothorax. Aortic and coronary artery atherosclerosis noted. Hepatobiliary: Mildly heterogeneous hepatic steatosis. No focal liver lesion or abnormal enhancement. No evidence of gallstones, gallbladder wall thickening or biliary dilatation.  Pancreas: Unremarkable. No pancreatic ductal dilatation or surrounding inflammatory changes. Spleen: Normal in size without focal abnormality. Adrenals/Urinary Tract: Both adrenal glands appear normal. No evidence of urinary tract calculus, suspicious renal lesion or hydronephrosis. Minimal air within the bladder lumen, probably iatrogenic. No bladder wall thickening or surrounding inflammation. Stomach/Bowel: Enteric tube extends into the distal stomach. Enteric contrast was administered and extends into the distal small bowel, the on the small bowel anastomosis in the right false pelvis. There is mild distension of the stomach, duodenum and proximal jejunum with scattered air-fluid levels but no significant wall thickening. Beyond the anastomosis, the small bowel is normal in caliber but does contain contrast. No evidence of contrast leak. The colon is decompressed without evidence of acute inflammation. Scattered diverticulosis of the sigmoid colon. Vascular/Lymphatic: There are no enlarged abdominal or pelvic lymph nodes. Aortic and  branch vessel atherosclerosis without evidence of aneurysm or large vessel occlusion. The portal, superior mesenteric and splenic veins are patent. Reproductive: Status post hysterectomy. No evidence of adnexal mass. Other: Small interloop fluid collection in the right false pelvis measuring 2.7 x 1.1 cm on image 57/2. Small to moderate amount of ascites has mildly increased in volume compared with the previous CT. Most of the fluid is around the liver and in the pelvis. There is mild adjacent thin peritoneal enhancement without organized fluid collection to suggest an abscess. No evidence of pneumoperitoneum or contrast extravasation. Postsurgical changes in the anterior abdominal wall. Musculoskeletal: No acute or significant osseous findings. Stable multilevel spondylosis associated with a convex right scoliosis. IMPRESSION: 1. Mild distension of the stomach, duodenum and proximal jejunum with scattered air-fluid levels, likely postoperative ileus. No evidence of contrast leak. 2. Small to moderate amount of ascites has mildly increased in volume compared with the previous CT. There is mild adjacent thin peritoneal enhancement without organized fluid collection to suggest an abscess. 3. New small bilateral pleural effusions with dependent airspace opacities at both lung bases, likely atelectasis. 4. No acute vascular findings. 5.  Aortic Atherosclerosis (ICD10-I70.0). Electronically Signed   By: Carey Bullocks M.D.   On: 07/31/2023 16:28   [place] EKG SITE RITE Result Date: 07/31/2023 If Site Rite image not attached, placement could not be confirmed due to current cardiac rhythm.  DG Abd Portable 1V Result Date: 07/30/2023 CLINICAL DATA:  Nasogastric tube placement. EXAM: PORTABLE ABDOMEN - 1 VIEW COMPARISON:  07/30/2023 at 1104 hours. FINDINGS: Nasogastric tube terminates in the gastric antrum. Persistent gaseous distention of small bowel in the mid abdomen. IMPRESSION: 1. Satisfactory nasogastric tube  placement. 2. Persistent small bowel obstruction. Electronically Signed   By: Leanna Battles M.D.   On: 07/30/2023 15:00   DG Abd Portable 1V Result Date: 07/30/2023 CLINICAL DATA:  Nausea and vomiting. EXAM: PORTABLE ABDOMEN - 1 VIEW COMPARISON:  CT abdomen pelvis 07/26/2023. FINDINGS: Present gaseous distention of small bowel. No colonic gas. Air and fluid in the stomach. No unexpected radiopaque calculi. Dextroconvex scoliosis of the lumbar spine with multilevel degenerative changes. IMPRESSION: Persistent small bowel obstruction. Electronically Signed   By: Leanna Battles M.D.   On: 07/30/2023 14:59   CT ABDOMEN PELVIS W CONTRAST Addendum Date: 07/26/2023 ADDENDUM REPORT: 07/26/2023 17:29 ADDENDUM: Critical Value/emergent results were called by telephone at the time of interpretation on 07/26/2023 at 5:29 pm to Dr. Beckey Downing, Who verbally acknowledged these results. Electronically Signed   By: Jules Schick M.D.   On: 07/26/2023 17:29   Result Date: 07/26/2023 CLINICAL DATA:  Abdominal pain, acute, nonlocalized.  EXAM: CT ABDOMEN AND PELVIS WITH CONTRAST TECHNIQUE: Multidetector CT imaging of the abdomen and pelvis was performed using the standard protocol following bolus administration of intravenous contrast. RADIATION DOSE REDUCTION: This exam was performed according to the departmental dose-optimization program which includes automated exposure control, adjustment of the mA and/or kV according to patient size and/or use of iterative reconstruction technique. CONTRAST:  OMNIPAQUE IOHEXOL 300 MG/ML  SOLN COMPARISON:  CT scan abdomen and pelvis from 04/29/2004. FINDINGS: Lower chest: There are dependent changes in the visualized lung bases. No overt consolidation. No pleural effusion. The heart is normal in size. No pericardial effusion. Hepatobiliary: The liver is normal in size. Non-cirrhotic configuration. No suspicious mass. These is mild diffuse hepatic steatosis. No intrahepatic or  extrahepatic bile duct dilation. No calcified gallstones. Normal gallbladder wall thickness. No pericholecystic inflammatory changes. Pancreas: Unremarkable. No pancreatic ductal dilatation or surrounding inflammatory changes. Spleen: Within normal limits. No focal lesion. Adrenals/Urinary Tract: Adrenal glands are unremarkable. No suspicious renal mass. No hydronephrosis. No renal or ureteric calculi. Unremarkable urinary bladder. Stomach/Bowel: There is closed loop obstruction of the several loops of distal small bowel with 2 points of transition in the right lower quadrant (series 2, images 48 and 57). The bowel loops are dilated measuring up to 3.6 cm in diameter. There is significant perienteric fat stranding/fluid and prominence of vasa recta. Wall of 1 of the bowel loop (series 7, image 35) does not enhance similar to the other dilated bowel loops and highly concerning for ischemia. There is no pneumoperitoneum, pneumatosis or portal venous gas. Vascular/Lymphatic: There is small amount of inter bowel free fluid as well as free fluid in the dependent pelvis and left paracolic gutter, likely reactive. No abdominal or pelvic lymphadenopathy, by size criteria. No aneurysmal dilation of the major abdominal arteries. There are moderate peripheral atherosclerotic vascular calcifications of the aorta and its major branches. Reproductive: The uterus is surgically absent. No large adnexal mass. Other: There is a tiny fat containing umbilical hernia. The soft tissues and abdominal wall are otherwise unremarkable. Musculoskeletal: No suspicious osseous lesions. There are moderate multilevel degenerative changes in the visualized spine. IMPRESSION: 1. Findings favor closed loop obstruction of several loops of distal small bowel, as described above, which may be due to underlying adhesions. 2. One of the dilated/obstructed bowel loop wall is not enhancing similar to the other bowel loops and highly concerning for  underlying bowel wall ischemia. 3. Small reactive ascites. No pneumoperitoneum, pneumatosis or portal venous gas. 4. Multiple other nonacute observations, as described above. Electronically Signed: By: Jules Schick M.D. On: 07/26/2023 17:25    Labs:  CBC: Recent Labs    08/01/23 0349 08/02/23 1120 08/03/23 0335 08/04/23 0009  WBC 15.5* 22.8* 26.2* 26.5*  HGB 9.6* 9.8* 9.6* 9.2*  HCT 29.2* 29.3* 28.6* 26.8*  PLT 254 313 360 457*    COAGS: No results for input(s): "INR", "APTT" in the last 8760 hours.  BMP: Recent Labs    08/01/23 0349 08/02/23 0418 08/03/23 0335 08/04/23 0009  NA 132* 134* 129* 128*  K 3.5 3.1* 3.4* 3.7  CL 101 102 100 97*  CO2 24 24 22 24   GLUCOSE 126* 124* 125* 125*  BUN 17 16 19 17   CALCIUM 9.3 9.6 9.7 9.4  CREATININE 0.53 <0.30* 0.37* 0.53  GFRNONAA >60 NOT CALCULATED >60 >60    LIVER FUNCTION TESTS: Recent Labs    07/29/23 0750 08/02/23 0418 08/03/23 0335 08/04/23 0009  BILITOT 0.7 1.1 1.0 0.6  AST 16 40 73* 49*  ALT 18 55* 82* 81*  ALKPHOS 41 219* 244* 230*  PROT 5.6* 5.8* 6.1* 5.9*  ALBUMIN 2.6* 2.2* 2.3* 2.2*    TUMOR MARKERS: No results for input(s): "AFPTM", "CEA", "CA199", "CHROMGRNA" in the last 8760 hours.  Assessment and Plan: 72 y.o. female with SBO s/p small bowel resection who presents with worsening leukocytosis and CT finding concerning for intraabdominal abscess.   VSS CBC WBC 26.5, hgb 9.2, plt 457 No INR, LFT mildly elevated  On DVT prophylactic Lovenox in AM Allergies reviewed   Risks and benefits discussed with the patient including bleeding, infection, damage to adjacent structures, bowel perforation/fistula connection, and sepsis.  All of the patient's questions were answered, patient is agreeable to proceed. Consent signed and in chart.  The procedure is tentatively scheduled for Monday pending IR schedule.   PLAN - Patient on TPN - INR in Sunday AM - Monday am Lovenox MAR held    Thank you for  this interesting consult.  I greatly enjoyed meeting LEANETTE BOWARD and look forward to participating in their care.  A copy of this report was sent to the requesting provider on this date.  Electronically Signed: Willette Brace, PA-C 08/04/2023, 12:41 PM   I spent a total of 40 Minutes    in face to face in clinical consultation, greater than 50% of which was counseling/coordinating care for intraabdominal fluid collection.   This chart was dictated using voice recognition software.  Despite best efforts to proofread,  errors can occur which can change the documentation meaning.

## 2023-08-04 NOTE — Progress Notes (Addendum)
ID brief note( weekend cross coverage)  Afebrile Lab with persistent leukocytosis     Latest Ref Rng & Units 08/04/2023   12:09 AM 08/03/2023    3:35 AM 08/02/2023   11:20 AM  CBC  WBC 4.0 - 10.5 K/uL 26.5  26.2  22.8   Hemoglobin 12.0 - 15.0 g/dL 9.2  9.6  9.8   Hematocrit 36.0 - 46.0 % 26.8  28.6  29.3   Platelets 150 - 400 K/uL 457  360  313       Latest Ref Rng & Units 08/04/2023   12:09 AM 08/03/2023    3:35 AM 08/02/2023    4:18 AM  CMP  Glucose 70 - 99 mg/dL 865  784  696   BUN 8 - 23 mg/dL 17  19  16    Creatinine 0.44 - 1.00 mg/dL 2.95  2.84  <1.32   Sodium 135 - 145 mmol/L 128  129  134   Potassium 3.5 - 5.1 mmol/L 3.7  3.4  3.1   Chloride 98 - 111 mmol/L 97  100  102   CO2 22 - 32 mmol/L 24  22  24    Calcium 8.9 - 10.3 mg/dL 9.4  9.7  9.6   Total Protein 6.5 - 8.1 g/dL 5.9  6.1  5.8   Total Bilirubin <1.2 mg/dL 0.6  1.0  1.1   Alkaline Phos 38 - 126 U/L 230  244  219   AST 15 - 41 U/L 49  73  40   ALT 0 - 44 U/L 81  82  55     IMPRESSION: 1. Multiple intra-abdominal fluid collections with more well-defined rim enhancement compared to the prior CT. Findings are concerning for abscesses. Largest is along the periphery of the right hepatic lobe and measures nearly 19 cm in length. 2. Mildly dilated loops of small bowel upstream from the anastomotic site slightly decreased in caliber compared to the prior study. Findings may represent a partial small bowel obstruction versus postoperative ileus. 3. There is circumferential wall thickening of the proximal jejunum compatible with enteritis. 4. Small bilateral pleural effusions with dependent bibasilar atelectasis or consolidation, similar to prior. 5. Aortic atherosclerosis (ICD10-I70.0).  Seen by surgery and IR has been consulted for drainage for intraabdominal abscess. Please send for aerobic/anaerobic cultures as appropriate Remains on zosyn  Monitor CBC and CMP  Following, please call with active  questions   Odette Fraction, MD Infectious Disease Physician [facility] [address] [place], [place] [address] Phone: [phone]  Fax: [phone]'

## 2023-08-05 DIAGNOSIS — E876 Hypokalemia: Secondary | ICD-10-CM | POA: Diagnosis not present

## 2023-08-05 DIAGNOSIS — E440 Moderate protein-calorie malnutrition: Secondary | ICD-10-CM | POA: Diagnosis not present

## 2023-08-05 DIAGNOSIS — D72829 Elevated white blood cell count, unspecified: Secondary | ICD-10-CM | POA: Diagnosis not present

## 2023-08-05 DIAGNOSIS — K56609 Unspecified intestinal obstruction, unspecified as to partial versus complete obstruction: Secondary | ICD-10-CM | POA: Diagnosis not present

## 2023-08-05 LAB — CBC WITH DIFFERENTIAL/PLATELET
Abs Immature Granulocytes: 0.77 10*3/uL — ABNORMAL HIGH (ref 0.00–0.07)
Basophils Absolute: 0.1 10*3/uL (ref 0.0–0.1)
Basophils Relative: 0 %
Eosinophils Absolute: 0.5 10*3/uL (ref 0.0–0.5)
Eosinophils Relative: 2 %
HCT: 29.1 % — ABNORMAL LOW (ref 36.0–46.0)
Hemoglobin: 9.3 g/dL — ABNORMAL LOW (ref 12.0–15.0)
Immature Granulocytes: 3 %
Lymphocytes Relative: 7 %
Lymphs Abs: 1.8 10*3/uL (ref 0.7–4.0)
MCH: 31.5 pg (ref 26.0–34.0)
MCHC: 32 g/dL (ref 30.0–36.0)
MCV: 98.6 fL (ref 80.0–100.0)
Monocytes Absolute: 1.5 10*3/uL — ABNORMAL HIGH (ref 0.1–1.0)
Monocytes Relative: 6 %
Neutro Abs: 20.3 10*3/uL — ABNORMAL HIGH (ref 1.7–7.7)
Neutrophils Relative %: 82 %
Platelets: 552 10*3/uL — ABNORMAL HIGH (ref 150–400)
RBC: 2.95 MIL/uL — ABNORMAL LOW (ref 3.87–5.11)
RDW: 14.1 % (ref 11.5–15.5)
WBC: 25 10*3/uL — ABNORMAL HIGH (ref 4.0–10.5)
nRBC: 0 % (ref 0.0–0.2)

## 2023-08-05 LAB — COMPREHENSIVE METABOLIC PANEL
ALT: 85 U/L — ABNORMAL HIGH (ref 0–44)
AST: 49 U/L — ABNORMAL HIGH (ref 15–41)
Albumin: 2.3 g/dL — ABNORMAL LOW (ref 3.5–5.0)
Alkaline Phosphatase: 234 U/L — ABNORMAL HIGH (ref 38–126)
Anion gap: 7 (ref 5–15)
BUN: 16 mg/dL (ref 8–23)
CO2: 23 mmol/L (ref 22–32)
Calcium: 9.5 mg/dL (ref 8.9–10.3)
Chloride: 100 mmol/L (ref 98–111)
Creatinine, Ser: 0.36 mg/dL — ABNORMAL LOW (ref 0.44–1.00)
GFR, Estimated: >60 mL/min (ref >60)
Glucose, Bld: 143 mg/dL — ABNORMAL HIGH (ref 70–99)
Potassium: 3.3 mmol/L — ABNORMAL LOW (ref 3.5–5.1)
Sodium: 130 mmol/L — ABNORMAL LOW (ref 135–145)
Total Bilirubin: 0.5 mg/dL (ref <1.2)
Total Protein: 5.9 g/dL — ABNORMAL LOW (ref 6.5–8.1)

## 2023-08-05 LAB — GLUCOSE, CAPILLARY
Glucose-Capillary: 111 mg/dL — ABNORMAL HIGH (ref 70–99)
Glucose-Capillary: 129 mg/dL — ABNORMAL HIGH (ref 70–99)
Glucose-Capillary: 133 mg/dL — ABNORMAL HIGH (ref 70–99)
Glucose-Capillary: 153 mg/dL — ABNORMAL HIGH (ref 70–99)

## 2023-08-05 LAB — PHOSPHORUS: Phosphorus: 3.7 mg/dL (ref 2.5–4.6)

## 2023-08-05 LAB — MAGNESIUM: Magnesium: 2.5 mg/dL — ABNORMAL HIGH (ref 1.7–2.4)

## 2023-08-05 LAB — PROTIME-INR
INR: 1.1 (ref 0.8–1.2)
Prothrombin Time: 13.9 s (ref 11.4–15.2)

## 2023-08-05 MED ORDER — POTASSIUM CHLORIDE 10 MEQ/100ML IV SOLN
10.0000 meq | INTRAVENOUS | Status: AC
Start: 2023-08-05 — End: 2023-08-05
  Administered 2023-08-05 (×4): 10 meq via INTRAVENOUS
  Filled 2023-08-05 (×4): qty 100

## 2023-08-05 MED ORDER — INSULIN ASPART 100 UNIT/ML IJ SOLN
0.0000 [IU] | Freq: Four times a day (QID) | INTRAMUSCULAR | Status: DC
  Administered 2023-08-05 – 2023-08-06 (×2): 2 [IU] via SUBCUTANEOUS
  Administered 2023-08-06: 3 [IU] via SUBCUTANEOUS
  Administered 2023-08-06 – 2023-08-07 (×3): 2 [IU] via SUBCUTANEOUS
  Administered 2023-08-07: 3 [IU] via SUBCUTANEOUS
  Administered 2023-08-07: 2 [IU] via SUBCUTANEOUS
  Administered 2023-08-08: 3 [IU] via SUBCUTANEOUS
  Administered 2023-08-08: 2 [IU] via SUBCUTANEOUS

## 2023-08-05 MED ORDER — ACETAMINOPHEN 10 MG/ML IV SOLN
1000.0000 mg | Freq: Four times a day (QID) | INTRAVENOUS | Status: AC | PRN
  Administered 2023-08-05 – 2023-08-06 (×3): 1000 mg via INTRAVENOUS
  Filled 2023-08-05 (×3): qty 100

## 2023-08-05 MED ORDER — FAT EMUL FISH OIL/PLANT BASED 20% (SMOFLIPID)IV EMUL
250.0000 mL | INTRAVENOUS | Status: AC
Start: 2023-08-05 — End: 2023-08-06
  Administered 2023-08-05: 250 mL via INTRAVENOUS
  Filled 2023-08-05: qty 250

## 2023-08-05 MED ORDER — TRACE MINERALS CU-MN-SE-ZN 300-55-60-3000 MCG/ML IV SOLN
INTRAVENOUS | Status: AC
  Filled 2023-08-05: qty 1000

## 2023-08-05 NOTE — Progress Notes (Signed)
Patient removed her NG tube, on-call notified. No new order. We continue to monitor.

## 2023-08-05 NOTE — Plan of Care (Signed)
  Problem: Education: Goal: Knowledge of General Education information will improve Description: Including pain rating scale, medication(s)/side effects and non-pharmacologic comfort measures Outcome: Progressing   Problem: Clinical Measurements: Goal: Will remain free from infection Outcome: Progressing   Problem: Activity: Goal: Risk for activity intolerance will decrease Outcome: Progressing   Problem: Nutrition: Goal: Adequate nutrition will be maintained Outcome: Progressing   Problem: Coping: Goal: Level of anxiety will decrease Outcome: Progressing   Problem: Elimination: Goal: Will not experience complications related to bowel motility Outcome: Progressing   Problem: Pain Management: Goal: General experience of comfort will improve Outcome: Progressing   Problem: Safety: Goal: Ability to remain free from injury will improve Outcome: Progressing   Problem: Skin Integrity: Goal: Risk for impaired skin integrity will decrease Outcome: Progressing

## 2023-08-05 NOTE — Progress Notes (Signed)
PHARMACY - TOTAL PARENTERAL NUTRITION CONSULT NOTE   Indication: Prolonged ileus  Patient Measurements: Height: 5\' 1"  (154.9 cm) Weight: 72.3 kg (159 lb 6.3 oz) IBW/kg (Calculated) : 47.8 TPN AdjBW (KG): 52.7 Body mass index is 30.12 kg/m. Usual Weight:   Assessment:  Pharmacy is consulted to start TPN on 72 yo female with a prolonged ileus. Pt was taken emergently to OR on 12/5 after CT was concerning for closed loop bowel obstruction with ischemia.   Glucose / Insulin:  - HbA1c 6.1%. prediabetic  - CBGs at goal < 150 (slight trend up in past 24 hours with added D10) - 4 units of insulin on SSI given in past 24 hours Electrolytes:  Lytes wnl exc Na 130, K down to 3.3. CorrCa borderline high at 10.7. Renal:  SCr and BUN wnl Hepatic:  - AST/ALT and Alk phos elevated but now stable. Albumin low at 2.3, prealbumin 5 (12/10) Intake / Output; MIVF:  - UOP uncharted, NG tube pulled out on 12/14, not replacing for now. Having BMs - On D5 1/2NS with 40 meq KCl at 50 ml/hr GI Imaging: 12/13 Abdominal xray: Multiple fluid collections concerning for abscesses. Possible post op ileus and enteritis.  GI Surgeries / Procedures:  - 12/5 SBO and perforated closed loop bowel obstruction with ischemic jejunum   Central access: 12/10 TPN start date: 12/10   Nutritional Goals:   Clinimix E 8/10 1000 mL daily will provide 80 grams of protein at 1164 Kcal with Lipids. D10 at 60 ml/hr will provide 490 kcal for a total of 1654 kcal. This will meet both the protein and calorie goals   Due to the Baxter IV fluid disruption, all adult parenteral nutrition will utilize premade Clinimix products +/- fat emulsion infusion. Our goal will be to continue providing as close to 100% of our patient's nutritional needs, but due to limited TPN Clinimix concentrations we will meet at least 80% of protein and 75% of kcal goals.   RD Assessment: Estimated Needs Total Energy Estimated Needs: 1600-1800 Total  Protein Estimated Needs: 80-90g Total Fluid Estimated Needs: 1.8L/day  Current Nutrition:  NPO  Plan:  NOW: Potassium chloride 10 meq IV x 4  At 1800: Change to Clinimix 8/10 (add specific electrolytes) at 41 mL/hr Continue SMOFlipid daily over 12 hours Electrolytes in TPN (per liter, non-adjustable):  Na - 35 mEq/L K - 30 mEq/L Ca - 0 mEq/L Mg - 0 mEq/L Phos - 15 mmol/L Cl:Ac ratio - ~1:1 Add standard MVI and trace elements to TPN Continue thiamine 100mg  IV x 5 days Increase to moderate q6h SSI and adjust as needed  Continue D10 at 16ml/hr Monitor TPN labs on Mon/Thurs  Enzo Bi, PharmD, BCPS, [place] Clinical Pharmacist 08/05/2023 7:19 AM

## 2023-08-05 NOTE — Progress Notes (Signed)
PROGRESS NOTE    [name]  WUJ:[id number] DOB: Aug 06, 1951 DOA: 07/26/2023 PCP: Alberteen Sam, FNP   Brief Narrative:  Laura Bradshaw is a 72 y.o. female with PMH of HTN, anxiety, depression, COPD, HSV, low back pain and mild dementia presenting with acute abdominal pain and vomiting. She was found to have closed-loop bowel obstruction and ischemia, taken to the OR for laparoscopic, converted to open, small bowel resection with intraoperative findings of perforated closed loop obstruction with ischemic jejunum.  She subsequently underwent resection on 07/26/2023 by Dr. Maisie Fus and currently NG tube remains in place.  Abdominal CT done and below.  WBC continue to remain elevated so we will continue IV Zosyn.    Ileus is removing him however she pulled out her NG tube overnight.  Repeat CT scan done and showed intra-abdominal abscesses and IR has now been consulted.  ID recommends continue IV Zosyn and sending cultures appropriately and current plan is for the patient to undergo aspiration and possible drain drain placement on Monday, 08/06/2023.  Assessment and Plan:  Small bowel obstruction, perforation, jejunal ischemia s/p Resection and now with Ileus which is improved complicated by Intraabdominal Abscesses now  -s/p resection 12/5 by Dr. Maisie Fus and is postoperative day 10.  -NG tube is in place and now surgery is attempting clamping trial and trialing her on a clear liquid diet this am and checking residual later today and if it is lower than the regular consider removing her NG tube  -Abd CT to r/o abscess per surgery is as below and she had a large bowel movement after the CT contrast yesterday but no further bowel function currently -Wound vac per surgery -Surg Path showed "Segment of jejunum with mucosal congestion and focal submucosal hemorrhage.Mesenteric hemorrhage with reactive changes. One benign lymph node  Negative for dysplasia or malignancy."  -Pain control as ordered.  Limit narcotics as able but will change IV hydromorphone to IV morphine given that she tolerates this better -CT scans have been repeated on 07/31/2023 and 08/03/2023 -Ileus is improving; her NG tube was removed overnight and general surgery recommending continue to leave out for now and stay just on ice chips and sips -PT/OT recommending Home Health PT/OT when stable for D/C   Hypokalemia -Likely to be replaced with TPN -Patient's K+ Level Trend: Recent Labs  Lab 07/30/23 0500 07/31/23 0447 08/01/23 0349 08/02/23 0418 08/03/23 0335 08/04/23 0009 08/05/23 0253  K 2.6* 3.7 3.5 3.1* 3.4* 3.7 3.3*  -Continue to Monitor and Replete as Necessary -Repeat CMP in the AM   Hyponatremia, worsened -Na+ Trend: Recent Labs  Lab 07/30/23 0500 07/31/23 0447 08/01/23 0349 08/02/23 0418 08/03/23 0335 08/04/23 0009 08/05/23 0253  NA 138 140 132* 134* 129* 128* 130*  -Continue to Monitor and Trend and Repeat CMP in the AM  .  HTN -Stable blood pressures have been remaining elevated. -Was going to resume her lisinopril but she remains strict n.p.o. given general surgery recommendations and will add IV hydralazine 10 mg every 6 as needed for SBP greater than 160 or DBP greater than 100 -Continue to monitor blood pressures per protocol -Last blood pressure reading was 143/59   Anxiety and Depression -Stable -Resume home meds when able to absorb anything po but remains Strict NPO for now until CT Scan results    COPD -Continue controller med and prn albuterol neb.  -Decreased rate of IVF given small pleural effusions -Encourage IS (at bedside) SpO2: 95 % O2 Flow Rate (L/min):  2 L/min -See CXR as below; Repeat CXR in the AM    Mild Hypercalcemia, improved -Likely due to dehydration, resolved. -Ca2+ was 10.5 and is now 9.5: -Continue to Monitor and Trend and repeat CBC in the AM   Leukocytosis, worsening in the setting of Intraabdominal Abscesses  -WBC Trend: Recent Labs  Lab  07/30/23 0500 07/31/23 0447 08/01/23 0349 08/02/23 1120 08/03/23 0335 08/04/23 0009 08/05/23 0253  WBC 17.2* 14.9* 15.5* 22.8* 26.2* 26.5* 25.0*  -Restarted IV Zosyn on 12/7.  -Blood cultures are NGTD at 5 Days on 07/26/23 but will repeat Blood Cx on 08/02/23  -CT Abd/Pelvis with Contrast 07/31/23 done and showed "Mild distension of the stomach, duodenum and proximal jejunum with scattered air-fluid levels, likely postoperative ileus. No evidence of contrast leak. Small to moderate amount of ascites has mildly increased in volume compared with the previous CT. There is mild adjacent thin peritoneal enhancement without organized fluid collection to suggest an abscess. New small bilateral pleural effusions with dependent airspace opacities at both lung bases, likely atelectasis. No acute vascular findings. Aortic Atherosclerosis" -Repeat CT Abd/Pelvis with Contrast on 08/03/2023 done and showed "Multiple intra-abdominal fluid collections with more well-defined rim enhancement compared to the prior CT. Findings are concerning for abscesses. Largest is along the periphery of the right hepatic lobe and measures nearly 19 cm in length. Mildly dilated loops of small bowel upstream from the anastomotic site slightly decreased in caliber compared to the prior study. Findings may represent a partial small bowel obstruction versus postoperative ileus. There is circumferential wall thickening of the proximal jejunum compatible with enteritis. Small bilateral pleural effusions with dependent bibasilar atelectasis or consolidation, similar to prior. Aortic atherosclerosis" -Consulted ID for further evaluation recommendations given her significant rise in WBC and recommending continuing IV Zosyn and recommending sending samples for aerobic and anaerobic cultures as appropriate -Interventional radiology has now been consulted and plan is for CT-guided aspiration and possible drain placement by Dr. Deanne Coffer on Monday,  08/06/2023 where she will need to be n.p.o. and obtain an INR on Sunday and hold her Lovenox on Monday -Continue to Monitor and Trend and repeat CBC in the AM  Abnormal LFTs  -Elevated in setting of Abscess along the periphery of R Hepatic Lobe -LFT Trend: Recent Labs  Lab 07/26/23 1358 07/29/23 0750 08/02/23 0418 08/03/23 0335 08/04/23 0009 08/05/23 0253  AST 31 16 40 73* 49* 49*  ALT 41 18 55* 82* 81* 85*  BILITOT 0.7 0.7 1.1 1.0 0.6 0.5  ALKPHOS 70 41 219* 244* 230* 234*  -Continue to monitor and trend and repeat CMP in the a.m. if necessary will obtain her acute hepatitis panel and further workup   Hyperglycemia in the setting of Prediabetes -HbA1c 6.1%.  -C/w Sensitive Novolog SSI q6h -CBG Trending from 106-153 -Outpatient follow up recommended.   Hypophosphatemia -Phos Level improved to 3.7 -Continue to Monitor and Replete as Necessary -Check Phos Level in the AM  Normocytic Anemia -Hgb/Hct Trend fairly stable since 08/01/23 but did drop and is now 9.3/29.1 -Checked Anemia Panel and showed an iron level of 37, UIBC 250, TIBC 287, saturation ratios of 13%, ferritin level of 188, folate level 11.2 and vitamin B12 368 -Continue to Monitor for S/Sx of Bleeding; no overt Bleeding noted -Repeat CBC in the AM   Moderate Protein Calorie Malnutrition Nutrition Status: Nutrition Problem: Moderate Malnutrition Etiology: acute illness Signs/Symptoms: mild fat depletion, mild muscle depletion, energy intake < 75% for > 7 days Interventions: TPN -Prealbumin was  5 -Nutritionist following and recommending TPN management per pharmacy and recommending 100 mg thiamine daily for 5 days and checking daily weights while on TPN  Hypoalbuminemia -Patient's Albumin Trend ranging from 2.2-4.5 with the last check being 2.3 -Continue to Monitor and Trend and repeat CMP in the AM   DVT prophylaxis: enoxaparin (LOVENOX) injection 30 mg Start: 07/27/23 0800 SCD's Start: 07/26/23 2150     Code Status: Full Code Family Communication: Discussed with the daughters at bedside  Disposition Plan:  Level of care: Telemetry Status is: Inpatient Remains inpatient appropriate because: Needs further clinical improvement and evaluation and drain placement for her intra-abdominal abscesses  Consultants:  General Surgery Interventional Radiology Infectious Diseases  Procedures:  Delineated as above LAPAROSCOPY DIAGNOSTIC CONVERTED TO OPEN SMALL BOWEL RESECTION   Antimicrobials:  Anti-infectives (From admission, onward)    Start     Dose/Rate Route Frequency Ordered Stop   07/28/23 1330  piperacillin-tazobactam (ZOSYN) IVPB 3.375 g        3.375 g 12.5 mL/hr over 240 Minutes Intravenous Every 8 hours 07/28/23 1232     07/26/23 1745  piperacillin-tazobactam (ZOSYN) IVPB 3.375 g        3.375 g 12.5 mL/hr over 240 Minutes Intravenous  Once 07/26/23 1737 07/26/23 1931       Subjective: Seen and examined at bedside and NG tube had been pulled out overnight.  Continues to pass some flatus.  Has some abdominal discomfort.  Feels okay otherwise.  No other concerns or complaints at this time.  Objective: Vitals:   08/04/23 1939 08/05/23 0540 08/05/23 0846 08/05/23 1230  BP: (!) 145/69 (!) 128/57  (!) 143/59  Pulse: 82 74  78  Resp: 19 19    Temp: 98.1 F (36.7 C) 98 F (36.7 C)  98.3 F (36.8 C)  TempSrc:    Oral  SpO2: 100% 98% 96% 95%  Weight:      Height:        Intake/Output Summary (Last 24 hours) at 08/05/2023 1537 Last data filed at 08/05/2023 0900 Gross per 24 hour  Intake 820.56 ml  Output --  Net 820.56 ml   Filed Weights   07/26/23 1342 08/03/23 0500 08/04/23 0505  Weight: 67.6 kg 74 kg 72.3 kg   Examination: Physical Exam:  Constitutional: WN/WD obese chronically ill-appearing Caucasian female Respiratory: Diminished to auscultation bilaterally with some coarse breath sounds, no wheezing, rales, rhonchi or crackles. Normal respiratory effort and  patient is not tachypenic. No accessory muscle use.  Unlabored breathing Cardiovascular: RRR, no murmurs / rubs / gallops. S1 and S2 auscultated. No extremity edema.  Abdomen: Soft, tender to palpate and distended secondary body habitus. Bowel sounds positive.  GU: Deferred. Musculoskeletal: No clubbing / cyanosis of digits/nails. No joint deformity upper and lower extremities.  Skin: No rashes, lesions, ulcers on limited skin evaluation. No induration; Warm and dry.  Neurologic: She is hard of hearing without her hearing aids.  No acute focal deficits noted Psychiatric: Normal judgment and insight.  Awake and alert  Data Reviewed: I have personally reviewed following labs and imaging studies  CBC: Recent Labs  Lab 08/01/23 0349 08/02/23 1120 08/03/23 0335 08/04/23 0009 08/05/23 0253  WBC 15.5* 22.8* 26.2* 26.5* 25.0*  NEUTROABS  --  17.6* 20.6* 21.5* 20.3*  HGB 9.6* 9.8* 9.6* 9.2* 9.3*  HCT 29.2* 29.3* 28.6* 26.8* 29.1*  MCV 96.1 95.8 95.3 94.7 98.6  PLT 254 313 360 457* 552*   Basic Metabolic Panel: Recent Labs  Lab 08/01/23  1610 08/02/23 0418 08/03/23 0335 08/04/23 0009 08/05/23 0253  NA 132* 134* 129* 128* 130*  K 3.5 3.1* 3.4* 3.7 3.3*  CL 101 102 100 97* 100  CO2 24 24 22 24 23   GLUCOSE 126* 124* 125* 125* 143*  BUN 17 16 19 17 16   CREATININE 0.53 <0.30* 0.37* 0.53 0.36*  CALCIUM 9.3 9.6 9.7 9.4 9.5  MG 2.2 2.1 2.3 2.3 2.5*  PHOS 2.4* 3.2 2.7 2.8 3.7   GFR: Estimated Creatinine Clearance: 57 mL/min (A) (by C-G formula based on SCr of 0.36 mg/dL (L)). Liver Function Tests: Recent Labs  Lab 08/02/23 0418 08/03/23 0335 08/04/23 0009 08/05/23 0253  AST 40 73* 49* 49*  ALT 55* 82* 81* 85*  ALKPHOS 219* 244* 230* 234*  BILITOT 1.1 1.0 0.6 0.5  PROT 5.8* 6.1* 5.9* 5.9*  ALBUMIN 2.2* 2.3* 2.2* 2.3*   No results for input(s): "LIPASE", "AMYLASE" in the last 168 hours. No results for input(s): "AMMONIA" in the last 168 hours. Coagulation Profile: Recent  Labs  Lab 08/05/23 0253  INR 1.1   Cardiac Enzymes: No results for input(s): "CKTOTAL", "CKMB", "CKMBINDEX", "TROPONINI" in the last 168 hours. BNP (last 3 results) No results for input(s): "PROBNP" in the last 8760 hours. HbA1C: No results for input(s): "HGBA1C" in the last 72 hours. CBG: Recent Labs  Lab 08/04/23 0541 08/04/23 1757 08/04/23 2352 08/05/23 0616 08/05/23 1135  GLUCAP 125* 130* 136* 153* 111*   Lipid Profile: No results for input(s): "CHOL", "HDL", "LDLCALC", "TRIG", "CHOLHDL", "LDLDIRECT" in the last 72 hours. Thyroid Function Tests: No results for input(s): "TSH", "T4TOTAL", "FREET4", "T3FREE", "THYROIDAB" in the last 72 hours. Anemia Panel: Recent Labs    08/03/23 0335  VITAMINB12 368  FOLATE 11.2  FERRITIN 188  TIBC 287  IRON 37  RETICCTPCT 2.2   Sepsis Labs: No results for input(s): "PROCALCITON", "LATICACIDVEN" in the last 168 hours.  Recent Results (from the past 240 hours)  Blood culture (routine x 2)     Status: None   Collection Time: 07/26/23  9:16 PM   Specimen: BLOOD  Result Value Ref Range Status   Specimen Description   Final    BLOOD BLOOD RIGHT HAND Performed at [facility], [address], [place], [place] [address]    Special Requests   Final    BOTTLES DRAWN AEROBIC AND ANAEROBIC Blood Culture results may not be optimal due to an inadequate volume of blood received in culture bottles Performed at [facility], [address], [place], [place] [address]    Culture   Final    NO GROWTH 5 DAYS Performed at [facility], [address] [place], [place] [address]    Report Status 07/31/2023 FINAL  Final  Blood culture (routine x 2)     Status: None   Collection Time: 07/26/23  9:16 PM   Specimen: BLOOD  Result Value Ref Range Status   Specimen Description   Final    BLOOD RIGHT ANTECUBITAL Performed at [facility], [address], [place], [place] [address]     Special Requests   Final    BOTTLES DRAWN AEROBIC AND ANAEROBIC Blood Culture adequate volume Performed at [facility], [address] [place], [place] [address]    Culture   Final    NO GROWTH 5 DAYS Performed at [facility], [address] [place], [place] [address]    Report Status 07/31/2023 FINAL  Final  Culture, blood (Routine X 2) w Reflex to ID Panel     Status: None (Preliminary result)   Collection Time: 08/02/23  4:08 PM   Specimen: BLOOD LEFT ARM  Result Value Ref Range Status   Specimen Description   Final    BLOOD LEFT ARM Performed at [facility], [address] [place], [place] [address]    Special Requests   Final    BOTTLES DRAWN AEROBIC AND ANAEROBIC Blood Culture results may not be optimal due to an inadequate volume of blood received in culture bottles Performed at [facility], [address], [place], [place] [address]    Culture   Final    NO GROWTH 3 DAYS Performed at [facility], [address], [place], [place] [address]    Report Status PENDING  Incomplete  Culture, blood (Routine X 2) w Reflex to ID Panel     Status: None (Preliminary result)   Collection Time: 08/02/23  4:16 PM   Specimen: BLOOD RIGHT HAND  Result Value Ref Range Status   Specimen Description   Final    BLOOD RIGHT HAND Performed at [facility], [address], [place], [place] [address]    Special Requests   Final    BOTTLES DRAWN AEROBIC AND ANAEROBIC Blood Culture results may not be optimal due to an inadequate volume of blood received in culture bottles Performed at [facility], [address], [place], [place] [address]    Culture   Final    NO GROWTH 3 DAYS Performed at [facility], [address], [place], [place] [address]    Report Status PENDING  Incomplete    Radiology Studies: No results found.  Scheduled Meds:  Chlorhexidine Gluconate Cloth  6 each Topical Daily    enoxaparin (LOVENOX) injection  30 mg Subcutaneous Q24H   escitalopram  20 mg Oral Daily   insulin aspart  0-15 Units Subcutaneous Q6H   lisinopril  10 mg Oral QHS   mometasone-formoterol  2 puff Inhalation BID   montelukast  10 mg Oral QHS   nicotine  21 mg Transdermal Daily   simvastatin  40 mg Oral Daily   sodium chloride flush  10-40 mL Intracatheter Q12H   thiamine (VITAMIN B1) injection  100 mg Intravenous Daily   Continuous Infusions:  acetaminophen 1,000 mg (08/05/23 1142)   dextrose 60 mL/hr at 08/05/23 1401   TPN (CLINIMIX) Adult with Electrolyte Additives     And   fat emul(SMOFlipid)     piperacillin-tazobactam (ZOSYN)  IV 3.375 g (08/05/23 1358)   TPN (CLINIMIX-E) Adult 41 mL/hr at 08/04/23 1713    LOS: 10 days   Marguerita Merles, DO [facility] Available via Epic secure chat 7am-7pm After these hours, please refer to coverage provider listed on [email or web address] 08/05/2023, 3:37 PM

## 2023-08-05 NOTE — Progress Notes (Signed)
10 Days Post-Op   Subjective/Chief Complaint: Sleeping, no issues, per daughter still having bowel function, ng pulled out overnight   Objective: Vital signs in last 24 hours: Temp:  [98 F (36.7 C)-98.1 F (36.7 C)] 98 F (36.7 C) (12/15 0540) Pulse Rate:  [74-82] 74 (12/15 0540) Resp:  [19-20] 19 (12/15 0540) BP: (128-145)/(57-69) 128/57 (12/15 0540) SpO2:  [97 %-100 %] 98 % (12/15 0540) Last BM Date : 08/03/23  Intake/Output from previous day: 12/14 0701 - 12/15 0700 In: 1698.1 [I.V.:1197.1; IV Piggyback:501] Out: -  Intake/Output this shift: No intake/output data recorded.  General nad, sleeping unchanged  Lab Results:  Recent Labs    08/04/23 0009 08/05/23 0253  WBC 26.5* 25.0*  HGB 9.2* 9.3*  HCT 26.8* 29.1*  PLT 457* 552*   BMET Recent Labs    08/04/23 0009 08/05/23 0253  NA 128* 130*  K 3.7 3.3*  CL 97* 100  CO2 24 23  GLUCOSE 125* 143*  BUN 17 16  CREATININE 0.53 0.36*  CALCIUM 9.4 9.5   PT/INR Recent Labs    08/05/23 0253  LABPROT 13.9  INR 1.1   ABG No results for input(s): "PHART", "HCO3" in the last 72 hours.  Invalid input(s): "PCO2", "PO2"  Studies/Results: CT ABDOMEN PELVIS W CONTRAST Result Date: 08/03/2023 CLINICAL DATA:  Abdominal pain, post-op evaluate for abscess formation EXAM: CT ABDOMEN AND PELVIS WITH CONTRAST TECHNIQUE: Multidetector CT imaging of the abdomen and pelvis was performed using the standard protocol following bolus administration of intravenous contrast. RADIATION DOSE REDUCTION: This exam was performed according to the departmental dose-optimization program which includes automated exposure control, adjustment of the mA and/or kV according to patient size and/or use of iterative reconstruction technique. CONTRAST:  OMNIPAQUE IOHEXOL 300 MG/ML SOLN, 30mL OMNIPAQUE IOHEXOL 300 MG/ML SOLN COMPARISON:  07/31/2023, 07/26/2023 FINDINGS: Lower chest: Small bilateral pleural effusions with dependent bibasilar  atelectasis or consolidation, similar to prior. Heart size within normal limits. Coronary artery atherosclerosis. Hepatobiliary: Mild hepatic steatosis. No focal liver lesion. Unremarkable gallbladder. No hyperdense gallstone. No biliary dilatation. Pancreas: Unremarkable. No pancreatic ductal dilatation or surrounding inflammatory changes. Spleen: Normal in size without focal abnormality. Adrenals/Urinary Tract: Unremarkable adrenal glands. Kidneys enhance symmetrically without focal lesion, stone, or hydronephrosis. Ureters are nondilated. Urinary bladder appears unremarkable for the degree of distention. Stomach/Bowel: Enteric tube extends into the distal gastric body. Stomach within normal limits. Postsurgical changes from small bowel resection. Mildly dilated loops of small bowel upstream from the anastomotic site slightly decreased in caliber compared to the prior study. There is circumferential wall thickening of the proximal jejunum (series 2, image 46). There is a small amount of residual contrast within the colon. No extraluminal contrast. Vascular/Lymphatic: Aortic atherosclerosis. No enlarged abdominal or pelvic lymph nodes. Reproductive: Status post hysterectomy. No adnexal masses. Other: Multiple intra-abdominal fluid collections with more well-defined rim enhancement compared to the prior CT. Elongated collection within the periphery of the right upper quadrant resulting in mass effect upon the right hepatic lobe measuring approximately 18.8 x 3.6 x 8.5 cm (volume = 300 cm^3)(series 5, image 45). Ovoid collection within the low pelvis at the midline measures 5.8 x 3.7 x 3.9 cm (volume = 44 cm^3) (series 2, image 74). Smaller collection along the peripheral margin of the spleen measures up to 7 cm (series 5, image 78). Small collection abutting the medial margin of the left hepatic lobe measures 3.2 x 1.6 cm (series 2, image 32). No free intraperitoneal air. Musculoskeletal: No new or  acute bony  findings. Lumbar dextrocurvature with multilevel spondylosis. IMPRESSION: 1. Multiple intra-abdominal fluid collections with more well-defined rim enhancement compared to the prior CT. Findings are concerning for abscesses. Largest is along the periphery of the right hepatic lobe and measures nearly 19 cm in length. 2. Mildly dilated loops of small bowel upstream from the anastomotic site slightly decreased in caliber compared to the prior study. Findings may represent a partial small bowel obstruction versus postoperative ileus. 3. There is circumferential wall thickening of the proximal jejunum compatible with enteritis. 4. Small bilateral pleural effusions with dependent bibasilar atelectasis or consolidation, similar to prior. 5. Aortic atherosclerosis (ICD10-I70.0). Electronically Signed   By: Duanne Guess D.O.   On: 08/03/2023 18:48   DG Abd Portable 1V Result Date: 08/03/2023 CLINICAL DATA:  [id number] Ileus, postoperative (HCC) [id number] EXAM: PORTABLE ABDOMEN - 1 VIEW COMPARISON:  07/31/2023 FINDINGS: Enteric tube terminates within the stomach. Slightly increasing size and number of dilated small bowel loops within the abdomen. No gross free intraperitoneal air on supine imaging. IMPRESSION: Slightly increasing size and number of dilated small bowel loops within the abdomen, which may represent ileus or small bowel obstruction. Electronically Signed   By: Duanne Guess D.O.   On: 08/03/2023 12:48    Anti-infectives: Anti-infectives (From admission, onward)    Start     Dose/Rate Route Frequency Ordered Stop   07/28/23 1330  piperacillin-tazobactam (ZOSYN) IVPB 3.375 g        3.375 g 12.5 mL/hr over 240 Minutes Intravenous Every 8 hours 07/28/23 1232     07/26/23 1745  piperacillin-tazobactam (ZOSYN) IVPB 3.375 g        3.375 g 12.5 mL/hr over 240 Minutes Intravenous  Once 07/26/23 1737 07/26/23 1931       Assessment/Plan: Closed loop SBO with ischemic small bowel and perforation  POD  10 s/p laparoscopy converted to open small bowel resection Dr. Maisie Fus - post-op ileus appears resolving - ct with likely IAA, consulted IR who plan to drain tomorrow (ideally this would be over the weekend) - VAC M/W/F - will leave ng out today, stay on sips/chips - continue to mobilize as tolerated   Severe protein calorie malnutrition - PICC and TPN,  prealbumin 5 12/10   FEN: NPO, PICC/TPN VTE: LMWH ID: Zosyn 12/5>>   Emelia Loron 08/05/2023

## 2023-08-06 ENCOUNTER — Encounter (HOSPITAL_COMMUNITY): Payer: Self-pay | Admitting: Student

## 2023-08-06 ENCOUNTER — Inpatient Hospital Stay (HOSPITAL_COMMUNITY): Payer: Medicare PPO

## 2023-08-06 DIAGNOSIS — K651 Peritoneal abscess: Secondary | ICD-10-CM

## 2023-08-06 DIAGNOSIS — T8143XA Infection following a procedure, organ and space surgical site, initial encounter: Secondary | ICD-10-CM | POA: Diagnosis not present

## 2023-08-06 DIAGNOSIS — K56609 Unspecified intestinal obstruction, unspecified as to partial versus complete obstruction: Secondary | ICD-10-CM | POA: Diagnosis not present

## 2023-08-06 DIAGNOSIS — E440 Moderate protein-calorie malnutrition: Secondary | ICD-10-CM | POA: Diagnosis not present

## 2023-08-06 LAB — COMPREHENSIVE METABOLIC PANEL
ALT: 84 U/L — ABNORMAL HIGH (ref 0–44)
AST: 44 U/L — ABNORMAL HIGH (ref 15–41)
Albumin: 2.3 g/dL — ABNORMAL LOW (ref 3.5–5.0)
Alkaline Phosphatase: 247 U/L — ABNORMAL HIGH (ref 38–126)
Anion gap: 6 (ref 5–15)
BUN: 16 mg/dL (ref 8–23)
CO2: 24 mmol/L (ref 22–32)
Calcium: 9.2 mg/dL (ref 8.9–10.3)
Chloride: 97 mmol/L — ABNORMAL LOW (ref 98–111)
Creatinine, Ser: 0.5 mg/dL (ref 0.44–1.00)
GFR, Estimated: >60 mL/min (ref >60)
Glucose, Bld: 125 mg/dL — ABNORMAL HIGH (ref 70–99)
Potassium: 3.4 mmol/L — ABNORMAL LOW (ref 3.5–5.1)
Sodium: 127 mmol/L — ABNORMAL LOW (ref 135–145)
Total Bilirubin: 0.5 mg/dL (ref <1.2)
Total Protein: 6.1 g/dL — ABNORMAL LOW (ref 6.5–8.1)

## 2023-08-06 LAB — CBC WITH DIFFERENTIAL/PLATELET
Abs Immature Granulocytes: 0.82 10*3/uL — ABNORMAL HIGH (ref 0.00–0.07)
Basophils Absolute: 0.1 10*3/uL (ref 0.0–0.1)
Basophils Relative: 0 %
Eosinophils Absolute: 0.5 10*3/uL (ref 0.0–0.5)
Eosinophils Relative: 2 %
HCT: 28.7 % — ABNORMAL LOW (ref 36.0–46.0)
Hemoglobin: 9.1 g/dL — ABNORMAL LOW (ref 12.0–15.0)
Immature Granulocytes: 3 %
Lymphocytes Relative: 8 %
Lymphs Abs: 2.1 10*3/uL (ref 0.7–4.0)
MCH: 31.3 pg (ref 26.0–34.0)
MCHC: 31.7 g/dL (ref 30.0–36.0)
MCV: 98.6 fL (ref 80.0–100.0)
Monocytes Absolute: 1.5 10*3/uL — ABNORMAL HIGH (ref 0.1–1.0)
Monocytes Relative: 6 %
Neutro Abs: 19.8 10*3/uL — ABNORMAL HIGH (ref 1.7–7.7)
Neutrophils Relative %: 81 %
Platelets: 627 10*3/uL — ABNORMAL HIGH (ref 150–400)
RBC: 2.91 MIL/uL — ABNORMAL LOW (ref 3.87–5.11)
RDW: 13.9 % (ref 11.5–15.5)
WBC: 24.8 10*3/uL — ABNORMAL HIGH (ref 4.0–10.5)
nRBC: 0 % (ref 0.0–0.2)

## 2023-08-06 LAB — MAGNESIUM: Magnesium: 2.4 mg/dL (ref 1.7–2.4)

## 2023-08-06 LAB — GLUCOSE, CAPILLARY
Glucose-Capillary: 121 mg/dL — ABNORMAL HIGH (ref 70–99)
Glucose-Capillary: 125 mg/dL — ABNORMAL HIGH (ref 70–99)
Glucose-Capillary: 133 mg/dL — ABNORMAL HIGH (ref 70–99)
Glucose-Capillary: 157 mg/dL — ABNORMAL HIGH (ref 70–99)
Glucose-Capillary: 96 mg/dL (ref 70–99)

## 2023-08-06 LAB — TRIGLYCERIDES: Triglycerides: 306 mg/dL — ABNORMAL HIGH (ref <150)

## 2023-08-06 LAB — PHOSPHORUS: Phosphorus: 2.5 mg/dL (ref 2.5–4.6)

## 2023-08-06 MED ORDER — MIDAZOLAM HCL 2 MG/2ML IJ SOLN
INTRAMUSCULAR | Status: AC | PRN
  Administered 2023-08-06: .5 mg via INTRAVENOUS

## 2023-08-06 MED ORDER — MIDAZOLAM HCL 2 MG/2ML IJ SOLN
INTRAMUSCULAR | Status: AC
  Filled 2023-08-06: qty 4

## 2023-08-06 MED ORDER — FENTANYL CITRATE (PF) 100 MCG/2ML IJ SOLN
INTRAMUSCULAR | Status: AC | PRN
  Administered 2023-08-06: 25 ug via INTRAVENOUS

## 2023-08-06 MED ORDER — LIDOCAINE 5 % EX PTCH
2.0000 | MEDICATED_PATCH | CUTANEOUS | Status: DC
  Administered 2023-08-06 – 2023-08-07 (×2): 2 via TRANSDERMAL
  Filled 2023-08-06 (×6): qty 2

## 2023-08-06 MED ORDER — MORPHINE SULFATE (PF) 2 MG/ML IV SOLN
1.0000 mg | INTRAVENOUS | Status: DC | PRN
  Administered 2023-08-06 – 2023-08-07 (×4): 1 mg via INTRAVENOUS
  Filled 2023-08-06 (×4): qty 1

## 2023-08-06 MED ORDER — POTASSIUM CHLORIDE 10 MEQ/50ML IV SOLN
10.0000 meq | INTRAVENOUS | Status: AC
Start: 2023-08-06 — End: 2023-08-06
  Administered 2023-08-06 (×3): 10 meq via INTRAVENOUS
  Filled 2023-08-06 (×3): qty 50

## 2023-08-06 MED ORDER — SODIUM CHLORIDE 0.9 % IV SOLN
150.0000 mg | INTRAVENOUS | Status: DC
  Administered 2023-08-07: 150 mg via INTRAVENOUS
  Filled 2023-08-06 (×2): qty 7.5

## 2023-08-06 MED ORDER — TRACE MINERALS CU-MN-SE-ZN 300-55-60-3000 MCG/ML IV SOLN
INTRAVENOUS | Status: DC
  Filled 2023-08-06: qty 1000

## 2023-08-06 MED ORDER — LIDOCAINE HCL 1 % IJ SOLN
INTRAMUSCULAR | Status: AC | PRN
  Administered 2023-08-06: 10 mL via INTRADERMAL

## 2023-08-06 MED ORDER — FAT EMUL FISH OIL/PLANT BASED 20% (SMOFLIPID)IV EMUL
250.0000 mL | INTRAVENOUS | Status: DC
  Filled 2023-08-06: qty 250

## 2023-08-06 MED ORDER — FLUMAZENIL 0.5 MG/5ML IV SOLN
INTRAVENOUS | Status: AC
  Filled 2023-08-06: qty 5

## 2023-08-06 MED ORDER — METOPROLOL TARTRATE 5 MG/5ML IV SOLN: 5.0000 mg | INTRAVENOUS | Status: DC | PRN

## 2023-08-06 MED ORDER — MIDAZOLAM HCL 2 MG/2ML IJ SOLN
INTRAMUSCULAR | Status: AC | PRN
  Administered 2023-08-06: 1 mg via INTRAVENOUS

## 2023-08-06 MED ORDER — HYDRALAZINE HCL 20 MG/ML IJ SOLN: 10.0000 mg | INTRAMUSCULAR | Status: DC | PRN

## 2023-08-06 MED ORDER — POTASSIUM CHLORIDE 2 MEQ/ML IV SOLN
INTRAVENOUS | Status: AC
  Filled 2023-08-06 (×4): qty 951.5

## 2023-08-06 MED ORDER — FENTANYL CITRATE (PF) 100 MCG/2ML IJ SOLN
INTRAMUSCULAR | Status: AC
  Filled 2023-08-06: qty 4

## 2023-08-06 MED ORDER — ACETAMINOPHEN 10 MG/ML IV SOLN
1000.0000 mg | Freq: Three times a day (TID) | INTRAVENOUS | Status: AC
Start: 2023-08-06 — End: 2023-08-06
  Administered 2023-08-06: 1000 mg via INTRAVENOUS
  Filled 2023-08-06: qty 100

## 2023-08-06 MED ORDER — ACETAMINOPHEN 10 MG/ML IV SOLN
1000.0000 mg | Freq: Four times a day (QID) | INTRAVENOUS | Status: DC | PRN
  Administered 2023-08-06: 1000 mg via INTRAVENOUS
  Filled 2023-08-06: qty 100

## 2023-08-06 MED ORDER — SODIUM CHLORIDE 0.9 % IV SOLN
INTRAVENOUS | Status: AC
Start: 2023-08-06 — End: ?
  Filled 2023-08-06: qty 250

## 2023-08-06 MED ORDER — FAT EMUL FISH OIL/PLANT BASED 20% (SMOFLIPID)IV EMUL: 250.0000 mL | INTRAVENOUS | Status: DC

## 2023-08-06 MED ORDER — NALOXONE HCL 0.4 MG/ML IJ SOLN
INTRAMUSCULAR | Status: AC
  Filled 2023-08-06: qty 1

## 2023-08-06 MED ORDER — TRACE MINERALS CU-MN-SE-ZN 300-55-60-3000 MCG/ML IV SOLN: INTRAVENOUS | Status: DC

## 2023-08-06 MED ORDER — FAT EMUL FISH OIL/PLANT BASED 20% (SMOFLIPID)IV EMUL
250.0000 mL | INTRAVENOUS | Status: AC
  Administered 2023-08-06: 250 mL via INTRAVENOUS
  Filled 2023-08-06: qty 250

## 2023-08-06 MED ORDER — SODIUM CHLORIDE 0.9% FLUSH
5.0000 mL | Freq: Three times a day (TID) | INTRAVENOUS | Status: DC
  Administered 2023-08-06 – 2023-08-11 (×15): 5 mL

## 2023-08-06 MED ORDER — POTASSIUM CHLORIDE 2 MEQ/ML IV SOLN
INTRAVENOUS | Status: AC
  Filled 2023-08-06: qty 1000

## 2023-08-06 MED ORDER — KETOROLAC TROMETHAMINE 15 MG/ML IJ SOLN
15.0000 mg | Freq: Three times a day (TID) | INTRAMUSCULAR | Status: AC | PRN
  Administered 2023-08-06 – 2023-08-10 (×7): 15 mg via INTRAVENOUS
  Filled 2023-08-06 (×7): qty 1

## 2023-08-06 MED ORDER — POTASSIUM CHLORIDE 2 MEQ/ML IV SOLN
INTRAVENOUS | Status: DC
  Filled 2023-08-06: qty 1000

## 2023-08-06 MED ORDER — BOOST / RESOURCE BREEZE PO LIQD CUSTOM
1.0000 | Freq: Three times a day (TID) | ORAL | Status: DC
  Administered 2023-08-06 (×2): 1 via ORAL

## 2023-08-06 NOTE — Progress Notes (Addendum)
PHARMACY - TOTAL PARENTERAL NUTRITION CONSULT NOTE   Indication: Prolonged ileus  Patient Measurements: Height: 5\' 1"  (154.9 cm) Weight: 65.1 kg (143 lb 8.3 oz) IBW/kg (Calculated) : 47.8 TPN AdjBW (KG): 52.7 Body mass index is 27.12 kg/m. Usual Weight:   Assessment:  Pharmacy is consulted to start TPN on 72 yo female with a prolonged ileus. Pt was taken emergently to OR on 12/5 after CT was concerning for closed loop bowel obstruction with ischemia.  Small bowel obstruction, perforation, jejunal ischemia s/p Resection and now with Ileus which is improved complicated by Intraabdominal Abscesses now   Glucose / Insulin:  - HbA1c 6.1%. prediabetic  - CBGs 111-157 (slight trend up in past 24 hours with added D10) - 7 units of insulin on SSI given in past 24 hours Electrolytes:  Lytes wnl exc Na 127 down, K 3.4. Cl low, CorrCa borderline high at 10.56. Renal:  SCr and BUN wnl Hepatic:  - AST/ALT and Alk phos elevated but now stable. Albumin low at 2.3, prealbumin 5 (12/10) Intake / Output; MIVF:  - UOP uncharted, LBM 12/13 - On D10 at 62ml/hr  GI Imaging: 12/13 Abdominal xray: Multiple fluid collections concerning for abscesses. Possible post op ileus and enteritis.  GI Surgeries / Procedures:  - 12/5 SBO and perforated closed loop bowel obstruction with ischemic jejunum   Central access: 12/10 TPN start date: 12/10   Nutritional Goals:   Clinimix E 8/10 1000 mL daily will provide 80 grams of protein at 1164 Kcal with Lipids. D10 at 60 ml/hr will provide 490 kcal for a total of 1654 kcal. This will meet both the protein and calorie goals   Due to the Baxter IV fluid disruption, all adult parenteral nutrition will utilize premade Clinimix products +/- fat emulsion infusion. Our goal will be to continue providing as close to 100% of our patient's nutritional needs, but due to limited TPN Clinimix concentrations we will meet at least 80% of protein and 75% of kcal goals.   RD  Assessment: Estimated Needs Total Energy Estimated Needs: 1600-1800 Total Protein Estimated Needs: 80-90g Total Fluid Estimated Needs: 1.8L/day  Current Nutrition:  NPO  Plan:  At 1800: Clinimix 8/10 (add specific electrolytes) at 41 mL/hr Continue SMOFlipid daily over 12 hours Electrolytes in TPN (per liter, non-adjustable):  Na - 35 mEq/L K - 30 mEq/L Ca - 0 mEq/L Mg - 0 mEq/L Phos - 15 mmol/L Cl:Ac ratio - ~1:1 Add standard MVI and trace elements to TPN Continue thiamine 100mg  IV x 5 days Increase to moderate q6h SSI and adjust as needed  Change IVF to D10 + NS + K+24meq at 3ml/hr ( K+ in 24hr) IV K+ IV x 3 runs today. Monitor TPN labs on Mon/Thurs and PRN  Kinsleigh Ludolph S. Merilynn Finland, PharmD, BCPS Clinical Staff Pharmacist 08/06/2023 9:12 AM

## 2023-08-06 NOTE — Progress Notes (Signed)
Subjective: No new complaints   Antibiotics:  Anti-infectives (From admission, onward)    Start     Dose/Rate Route Frequency Ordered Stop   07/28/23 1330  piperacillin-tazobactam (ZOSYN) IVPB 3.375 g        3.375 g 12.5 mL/hr over 240 Minutes Intravenous Every 8 hours 07/28/23 1232     07/26/23 1745  piperacillin-tazobactam (ZOSYN) IVPB 3.375 g        3.375 g 12.5 mL/hr over 240 Minutes Intravenous  Once 07/26/23 1737 07/26/23 1931       Medications: Scheduled Meds:  Chlorhexidine Gluconate Cloth  6 each Topical Daily   enoxaparin (LOVENOX) injection  30 mg Subcutaneous Q24H   escitalopram  20 mg Oral Daily   insulin aspart  0-15 Units Subcutaneous Q6H   lidocaine  2 patch Transdermal Q24H   lisinopril  10 mg Oral QHS   mometasone-formoterol  2 puff Inhalation BID   montelukast  10 mg Oral QHS   nicotine  21 mg Transdermal Daily   simvastatin  40 mg Oral Daily   sodium chloride flush  10-40 mL Intracatheter Q12H   thiamine (VITAMIN B1) injection  100 mg Intravenous Daily   Continuous Infusions:  acetaminophen     TPN (CLINIMIX) Adult with Electrolyte Additives     And   fat emul(SMOFlipid)     piperacillin-tazobactam (ZOSYN)  IV 3.375 g (08/06/23 0532)   potassium chloride 10 mEq (08/06/23 1146)   sodium chloride 154 mEq/L, potassium chloride 20 mEq/L in dextrose 10 % 1,000 mL Pediatric IV infusion 60 mL/hr at 08/06/23 1011   TPN (CLINIMIX) Adult with Electrolyte Additives 41 mL/hr at 08/05/23 1804   PRN Meds:.albuterol, fluticasone, guaiFENesin-dextromethorphan, hydrALAZINE, ketorolac, LORazepam, methocarbamol (ROBAXIN) injection, metoprolol tartrate, morphine injection, ondansetron **OR** ondansetron (ZOFRAN) IV, simethicone, sodium chloride flush    Objective: Weight change:   Intake/Output Summary (Last 24 hours) at 08/06/2023 1212 Last data filed at 08/05/2023 1800 Gross per 24 hour  Intake 0 ml  Output --  Net 0 ml   Blood pressure (!)  140/59, pulse 79, temperature 99.4 F (37.4 C), temperature source Oral, resp. rate 18, height 5\' 1"  (1.549 m), weight 65.1 kg, SpO2 98%. Temp:  [97.7 F (36.5 C)-99.4 F (37.4 C)] 99.4 F (37.4 C) (12/16 0453) Pulse Rate:  [78-82] 79 (12/16 0453) Resp:  [16-18] 18 (12/16 0453) BP: (132-149)/(59-64) 140/59 (12/16 0453) SpO2:  [95 %-99 %] 98 % (12/16 0453) Weight:  [65.1 kg] 65.1 kg (12/16 0453)  Physical Exam: Physical Exam Constitutional:      General: She is not in acute distress.    Appearance: She is well-developed. She is not diaphoretic.  HENT:     Head: Normocephalic and atraumatic.     Right Ear: External ear normal.     Left Ear: External ear normal.     Mouth/Throat:     Pharynx: No oropharyngeal exudate.  Eyes:     General: No scleral icterus.    Conjunctiva/sclera: Conjunctivae normal.     Pupils: Pupils are equal, round, and reactive to light.  Cardiovascular:     Rate and Rhythm: Normal rate and regular rhythm.     Heart sounds: No murmur heard. Pulmonary:     Effort: Pulmonary effort is normal. No respiratory distress.     Breath sounds: No wheezing.  Abdominal:     Palpations: Abdomen is soft.  Lymphadenopathy:     Cervical: No cervical adenopathy.  Skin:  General: Skin is warm and dry.     Coloration: Skin is not pale.     Findings: No erythema or rash.  Neurological:     General: No focal deficit present.     Mental Status: She is alert and oriented to person, place, and time.     Motor: No abnormal muscle tone.  Psychiatric:        Mood and Affect: Mood normal.        Behavior: Behavior normal.        Thought Content: Thought content normal.        Judgment: Judgment normal.      CBC:    BMET Recent Labs    08/05/23 0253 08/06/23 0231  NA 130* 127*  K 3.3* 3.4*  CL 100 97*  CO2 23 24  GLUCOSE 143* 125*  BUN 16 16  CREATININE 0.36* 0.50  CALCIUM 9.5 9.2     Liver Panel  Recent Labs    08/05/23 0253 08/06/23 0231  PROT  5.9* 6.1*  ALBUMIN 2.3* 2.3*  AST 49* 44*  ALT 85* 84*  ALKPHOS 234* 247*  BILITOT 0.5 0.5       Sedimentation Rate No results for input(s): "ESRSEDRATE" in the last 72 hours. C-Reactive Protein No results for input(s): "CRP" in the last 72 hours.  Micro Results: Recent Results (from the past 720 hours)  Blood culture (routine x 2)     Status: None   Collection Time: 07/26/23  9:16 PM   Specimen: BLOOD  Result Value Ref Range Status   Specimen Description   Final    BLOOD BLOOD RIGHT HAND Performed at [facility], [address] [place], [place] [address]    Special Requests   Final    BOTTLES DRAWN AEROBIC AND ANAEROBIC Blood Culture results may not be optimal due to an inadequate volume of blood received in culture bottles Performed at [facility], [address], [place], [place] [address]    Culture   Final    NO GROWTH 5 DAYS Performed at [facility], [address] [place], [place] [address]    Report Status 07/31/2023 FINAL  Final  Blood culture (routine x 2)     Status: None   Collection Time: 07/26/23  9:16 PM   Specimen: BLOOD  Result Value Ref Range Status   Specimen Description   Final    BLOOD RIGHT ANTECUBITAL Performed at [facility], [address] [place], [place] [address]    Special Requests   Final    BOTTLES DRAWN AEROBIC AND ANAEROBIC Blood Culture adequate volume Performed at [facility], [address], [place], [place] [address]    Culture   Final    NO GROWTH 5 DAYS Performed at [facility], [address], [place], [place] [address]    Report Status 07/31/2023 FINAL  Final  Culture, blood (Routine X 2) w Reflex to ID Panel     Status: None (Preliminary result)   Collection Time: 08/02/23  4:08 PM   Specimen: BLOOD LEFT ARM  Result Value Ref Range Status   Specimen Description   Final    BLOOD LEFT ARM Performed at [facility], [address] [place], [place] [address]    Special Requests   Final    BOTTLES DRAWN AEROBIC AND ANAEROBIC Blood Culture results may not be optimal due to an inadequate volume of blood received  in culture bottles Performed at [facility], [address], [place], [place] [address]    Culture   Final    NO GROWTH 4 DAYS Performed at [facility], [address], [place], [place] [address]    Report Status PENDING  Incomplete  Culture, blood (Routine X 2) w Reflex to ID Panel     Status: None (Preliminary result)   Collection Time: 08/02/23  4:16 PM   Specimen: BLOOD RIGHT HAND  Result Value Ref Range Status   Specimen Description   Final    BLOOD RIGHT HAND Performed at [facility], [address] [place], [place] [address]    Special Requests   Final    BOTTLES DRAWN AEROBIC AND ANAEROBIC Blood Culture results may not be optimal due to an inadequate volume of blood received in culture bottles Performed at [facility], [address], [place], [place] [address]    Culture   Final    NO GROWTH 4 DAYS Performed at [facility], [address], [place], [place] [address]    Report Status PENDING  Incomplete    Studies/Results: No results found.    Assessment/Plan:  INTERVAL HISTORY: pt to have IR guided drainage of abscess(es) today   Principal Problem:   Small bowel obstruction (HCC) Active Problems:   Malnutrition of moderate degree    Laura Bradshaw is a 72 y.o. female with Mall bowel obstruction status post resection, complicated now by intra-abdominal abscesses  #1 Intrabdominal abscesses:  Hopefully the cultures that IR obtain can allow [place] to give her more narrow antibiotics  For now would continue the zosyn  #2 SBO sp surgery. Also on TPN via dual lumen PICC  I have personally spent 50 minutes involved in face-to-face and non-face-to-face activities for this patient on the day of the visit.  Professional time spent includes the following activities: Preparing to see the patient (review of tests), Obtaining and/or reviewing separately obtained history (admission/discharge record), Performing a medically appropriate examination and/or evaluation , Ordering medications/tests/procedures, referring and communicating with other health care professionals, Documenting clinical information in the EMR, Independently interpreting results (not separately reported), Communicating results to the patient/family/caregiver, Counseling and educating the patient/family/caregiver and Care coordination (not separately reported).     LOS: 11 days   Acey Lav 08/06/2023, 12:12 PM

## 2023-08-06 NOTE — Progress Notes (Signed)
PROGRESS NOTE    Laura Bradshaw  WUJ:[id number] DOB: 1950/08/23 DOA: 07/26/2023 PCP: Alberteen Sam, FNP    Brief Narrative:  76 with history of HTN, anxiety, depression, COPD, HSV, low back pain, mild dementia comes to the hospital with acute abdominal pain and vomiting.  She was found to have closed-loop obstruction with ischemia.  Taken to the OR for laparoscopic which was eventually converted to open requiring small bowel resection due to perforation on 12/5.  Postop developed ileus requiring NG tube later repeat scan showed intra-abdominal abscess therefore IR consulted.  ID has been following and patient has been on broad-spectrum IV antibiotics.   Assessment & Plan:  Principal Problem:   Small bowel obstruction (HCC) Active Problems:   Malnutrition of moderate degree    Small bowel obstruction, perforation with ischemia requiring open resection Postop complicated by ileus and now possible abscess Initial surgery performed on 12/5.  Postop developed ileus requiring NG tube which has now been removed.  Repeat scan shows concerns of abscess therefore IR consulted -On empiric Zosyn - TPN, possibly advance diet as tolerated   Hypokalemia/hyponatremia/hypophosphatemia Repleted as needed  Essential hypertension On lisinopril.  IV as needed   Anxiety and Depression -Stable   COPD As needed bronchodilators   Mild Hypercalcemia, improved Resolved   Leukocytosis, worsening in the setting of Intraabdominal Abscesses  Reactive along with underlying infection   Abnormal LFTs  -Elevated in setting of Abscess along the periphery of R Hepatic Lobe Trend LFTs   Hyperglycemia in the setting of Prediabetes -HbA1c 6.1%.  Sliding scale and Accu-Cheks  Normocytic Anemia Hemoglobin stable   Moderate Protein Calorie Malnutrition Nutrition Status: Current on TPN, will advance as tolerated  Remove PICC once no longer needed   DVT prophylaxis: Lovenox Code Status: Full  code Family Communication:  Family at bedside Status is: Inpatient Remains inpatient appropriate because: Ongoing complicated abd issues. Cont hosp stay    Subjective: Still having abd pain.  Family at bedside.    Examination:  General exam: Appears calm and comfortable  Respiratory system: Clear to auscultation. Respiratory effort normal. Cardiovascular system: S1 & S2 heard, RRR. No JVD, murmurs, rubs, gallops or clicks. No pedal edema. Gastrointestinal system: Abdomen is nondistended, soft and nontender. No organomegaly or masses felt. Normal bowel sounds heard. Central nervous system: Alert and oriented. No focal neurological deficits. Extremities: Symmetric 5 x 5 power. Skin: No rashes, lesions or ulcers Psychiatry: Judgement and insight appear normal. Mood & affect appropriate.                Diet Orders (From admission, onward)     Start     Ordered   08/06/23 0001  Diet NPO time specified Except for: Sips with Meds  Diet effective midnight       Question:  Except for  Answer:  Sips with Meds   08/04/23 1243            Objective: Vitals:   08/06/23 1325 08/06/23 1330 08/06/23 1335 08/06/23 1340  BP: (!) 113/53 (!) 124/46 (!) 113/55 (!) 108/58  Pulse: 75 71 71 72  Resp: 20 14 13 14   Temp:      TempSrc:      SpO2: 98% 98% 100% 99%  Weight:      Height:        Intake/Output Summary (Last 24 hours) at 08/06/2023 1412 Last data filed at 08/06/2023 1349 Gross per 24 hour  Intake 3731.99 ml  Output 40 ml  Net 3691.99 ml   Filed Weights   08/03/23 0500 08/04/23 0505 08/06/23 0453  Weight: 74 kg 72.3 kg 65.1 kg    Scheduled Meds:  Chlorhexidine Gluconate Cloth  6 each Topical Daily   enoxaparin (LOVENOX) injection  30 mg Subcutaneous Q24H   escitalopram  20 mg Oral Daily   insulin aspart  0-15 Units Subcutaneous Q6H   lidocaine  2 patch Transdermal Q24H   lisinopril  10 mg Oral QHS   mometasone-formoterol  2 puff Inhalation BID    montelukast  10 mg Oral QHS   nicotine  21 mg Transdermal Daily   simvastatin  40 mg Oral Daily   sodium chloride flush  10-40 mL Intracatheter Q12H   thiamine (VITAMIN B1) injection  100 mg Intravenous Daily   Continuous Infusions:  acetaminophen     TPN (CLINIMIX) Adult with Electrolyte Additives     And   fat emul(SMOFlipid)     piperacillin-tazobactam (ZOSYN)  IV 12.5 mL/hr at 08/06/23 1349   sodium chloride 154 mEq/L, potassium chloride 20 mEq/L in dextrose 10 % 1,000 mL Pediatric IV infusion 60 mL/hr at 08/06/23 1349   TPN (CLINIMIX) Adult with Electrolyte Additives Stopped (08/06/23 1330)    Nutritional status Signs/Symptoms: mild fat depletion, mild muscle depletion, energy intake < 75% for > 7 days Interventions: TPN Body mass index is 27.12 kg/m.  Data Reviewed:   CBC: Recent Labs  Lab 08/02/23 1120 08/03/23 0335 08/04/23 0009 08/05/23 0253 08/06/23 0231  WBC 22.8* 26.2* 26.5* 25.0* 24.8*  NEUTROABS 17.6* 20.6* 21.5* 20.3* 19.8*  HGB 9.8* 9.6* 9.2* 9.3* 9.1*  HCT 29.3* 28.6* 26.8* 29.1* 28.7*  MCV 95.8 95.3 94.7 98.6 98.6  PLT 313 360 457* 552* 627*   Basic Metabolic Panel: Recent Labs  Lab 08/02/23 0418 08/03/23 0335 08/04/23 0009 08/05/23 0253 08/06/23 0231  NA 134* 129* 128* 130* 127*  K 3.1* 3.4* 3.7 3.3* 3.4*  CL 102 100 97* 100 97*  CO2 24 22 24 23 24   GLUCOSE 124* 125* 125* 143* 125*  BUN 16 19 17 16 16   CREATININE <0.30* 0.37* 0.53 0.36* 0.50  CALCIUM 9.6 9.7 9.4 9.5 9.2  MG 2.1 2.3 2.3 2.5* 2.4  PHOS 3.2 2.7 2.8 3.7 2.5   GFR: Estimated Creatinine Clearance: 54.1 mL/min (by C-G formula based on SCr of 0.5 mg/dL). Liver Function Tests: Recent Labs  Lab 08/02/23 0418 08/03/23 0335 08/04/23 0009 08/05/23 0253 08/06/23 0231  AST 40 73* 49* 49* 44*  ALT 55* 82* 81* 85* 84*  ALKPHOS 219* 244* 230* 234* 247*  BILITOT 1.1 1.0 0.6 0.5 0.5  PROT 5.8* 6.1* 5.9* 5.9* 6.1*  ALBUMIN 2.2* 2.3* 2.2* 2.3* 2.3*   No results for input(s):  "LIPASE", "AMYLASE" in the last 168 hours. No results for input(s): "AMMONIA" in the last 168 hours. Coagulation Profile: Recent Labs  Lab 08/05/23 0253  INR 1.1   Cardiac Enzymes: No results for input(s): "CKTOTAL", "CKMB", "CKMBINDEX", "TROPONINI" in the last 168 hours. BNP (last 3 results) No results for input(s): "PROBNP" in the last 8760 hours. HbA1C: No results for input(s): "HGBA1C" in the last 72 hours. CBG: Recent Labs  Lab 08/05/23 1135 08/05/23 1824 08/05/23 2335 08/06/23 0530 08/06/23 0826  GLUCAP 111* 129* 133* 157* 133*   Lipid Profile: Recent Labs    08/06/23 0231  TRIG 306*   Thyroid Function Tests: No results for input(s): "TSH", "T4TOTAL", "FREET4", "T3FREE", "THYROIDAB" in the last 72 hours. Anemia Panel: No results for input(s): "  VITAMINB12", "FOLATE", "FERRITIN", "TIBC", "IRON", "RETICCTPCT" in the last 72 hours. Sepsis Labs: No results for input(s): "PROCALCITON", "LATICACIDVEN" in the last 168 hours.  Recent Results (from the past 240 hours)  Culture, blood (Routine X 2) w Reflex to ID Panel     Status: None (Preliminary result)   Collection Time: 08/02/23  4:08 PM   Specimen: BLOOD LEFT ARM  Result Value Ref Range Status   Specimen Description   Final    BLOOD LEFT ARM Performed at [facility], [address] [place], [place] [address]    Special Requests   Final    BOTTLES DRAWN AEROBIC AND ANAEROBIC Blood Culture results may not be optimal due to an inadequate volume of blood received in culture bottles Performed at [facility], [address] [place], [place] [address]    Culture   Final    NO GROWTH 4 DAYS Performed at [facility], [address], [place], [place] [address]    Report Status PENDING  Incomplete  Culture, blood (Routine X 2) w Reflex to ID Panel     Status: None (Preliminary result)   Collection Time: 08/02/23  4:16 PM   Specimen: BLOOD RIGHT HAND  Result Value Ref Range Status    Specimen Description   Final    BLOOD RIGHT HAND Performed at [facility], [address] [place], [place] [address]    Special Requests   Final    BOTTLES DRAWN AEROBIC AND ANAEROBIC Blood Culture results may not be optimal due to an inadequate volume of blood received in culture bottles Performed at [facility], [address], [place], [place] [address]    Culture   Final    NO GROWTH 4 DAYS Performed at [facility], [address], [place], [place] [address]    Report Status PENDING  Incomplete         Radiology Studies: No results found.         LOS: 11 days   Time spent= 35 mins    Miguel Rota, MD [facility]  If 7PM-7AM, please contact night-coverage  08/06/2023, 2:12 PM

## 2023-08-06 NOTE — Plan of Care (Signed)

## 2023-08-06 NOTE — Progress Notes (Signed)
11 Days Post-Op   Subjective/Chief Complaint: Reports multiple 2 loose stools overnight. Endorses flatus. Denies nausea. She complains of back pain as well as abdominal pain worse with movement.  Patient is taking IV tylenol for pain, no narcotics. No muscle relaxers in over 48 hours. This is her preference,.   Objective: Vital signs in last 24 hours: Temp:  [97.7 F (36.5 C)-99.4 F (37.4 C)] 99.4 F (37.4 C) (12/16 0453) Pulse Rate:  [78-82] 79 (12/16 0453) Resp:  [16-18] 18 (12/16 0453) BP: (132-149)/(59-64) 140/59 (12/16 0453) SpO2:  [95 %-99 %] 98 % (12/16 0453) Weight:  [65.1 kg] 65.1 kg (12/16 0453) Last BM Date : 08/03/23  Intake/Output from previous day: No intake/output data recorded. Intake/Output this shift: No intake/output data recorded.  Alert, NAD Resp: slightly labored on nasal cannula  Abd: soft, appropraitely tender, no peritonitis, vac in place holding suction, laparoscopic incisions c/d/I without cellulitis    Lab Results:  Recent Labs    08/05/23 0253 08/06/23 0231  WBC 25.0* 24.8*  HGB 9.3* 9.1*  HCT 29.1* 28.7*  PLT 552* 627*   BMET Recent Labs    08/05/23 0253 08/06/23 0231  NA 130* 127*  K 3.3* 3.4*  CL 100 97*  CO2 23 24  GLUCOSE 143* 125*  BUN 16 16  CREATININE 0.36* 0.50  CALCIUM 9.5 9.2   PT/INR Recent Labs    08/05/23 0253  LABPROT 13.9  INR 1.1   ABG No results for input(s): "PHART", "HCO3" in the last 72 hours.  Invalid input(s): "PCO2", "PO2"  Studies/Results: No results found.   Anti-infectives: Anti-infectives (From admission, onward)    Start     Dose/Rate Route Frequency Ordered Stop   07/28/23 1330  piperacillin-tazobactam (ZOSYN) IVPB 3.375 g        3.375 g 12.5 mL/hr over 240 Minutes Intravenous Every 8 hours 07/28/23 1232     07/26/23 1745  piperacillin-tazobactam (ZOSYN) IVPB 3.375 g        3.375 g 12.5 mL/hr over 240 Minutes Intravenous  Once 07/26/23 1737 07/26/23 1931        Assessment/Plan: Closed loop SBO with ischemic small bowel and perforation  POD 11 s/p laparoscopy converted to open small bowel resection Dr. Maisie Fus - post-op ileus seems to be resolving - IR planning percutaneous drainage of IAA today - plan to start CLD after procedure. - VAC M/W/F - continue to mobilize as tolerated - K pad ordered for back pain. Lidoderm patches ordered for back and abdominal pain. Continue tylenol.    Severe protein calorie malnutrition - PICC and TPN,  prealbumin 5 12/10   FEN: NPO, PICC/TPN; CLD after procedure VTE: LMWH ID: Zosyn 12/5>>   Laura Bradshaw 08/06/2023

## 2023-08-06 NOTE — Progress Notes (Signed)
PT Cancellation Note  Patient Details Name: Laura Bradshaw MRN: [id number] DOB: 1950/09/27   Cancelled Treatment:      severe ABD pain in am then CT Guided Drainage of right abdominal and pelvic abscesses  Will attempt to see tomorrow.    Felecia Shelling  PTA Acute  Rehabilitation Services Office M-F          [phone]

## 2023-08-06 NOTE — Procedures (Signed)
Interventional Radiology Procedure Note  Procedure: CT Guided Drainage of right abdominal and pelvic abscesses  Complications: None  Estimated Blood Loss: < 10 mL  Findings: 10 Fr drain placed in right perihepatic abscess with return of purulent fluid. Fluid sample sent for culture analysis. Drain attached to suction bulb drainage.  10 Fr drain placed in pelvic abscess from left transgluteal approach with return of bloody fluid. Attached to suction bulb drainage.  Will follow.  Jodi Marble. Fredia Sorrow, M.D Pager:  [phone]

## 2023-08-07 DIAGNOSIS — E440 Moderate protein-calorie malnutrition: Secondary | ICD-10-CM | POA: Diagnosis not present

## 2023-08-07 DIAGNOSIS — T8143XA Infection following a procedure, organ and space surgical site, initial encounter: Secondary | ICD-10-CM | POA: Diagnosis not present

## 2023-08-07 DIAGNOSIS — K56609 Unspecified intestinal obstruction, unspecified as to partial versus complete obstruction: Secondary | ICD-10-CM | POA: Diagnosis not present

## 2023-08-07 DIAGNOSIS — K651 Peritoneal abscess: Secondary | ICD-10-CM | POA: Diagnosis not present

## 2023-08-07 LAB — COMPREHENSIVE METABOLIC PANEL
ALT: 85 U/L — ABNORMAL HIGH (ref 0–44)
AST: 41 U/L (ref 15–41)
Albumin: 2.3 g/dL — ABNORMAL LOW (ref 3.5–5.0)
Alkaline Phosphatase: 235 U/L — ABNORMAL HIGH (ref 38–126)
Anion gap: 7 (ref 5–15)
BUN: 17 mg/dL (ref 8–23)
CO2: 23 mmol/L (ref 22–32)
Calcium: 9.4 mg/dL (ref 8.9–10.3)
Chloride: 104 mmol/L (ref 98–111)
Creatinine, Ser: 0.59 mg/dL (ref 0.44–1.00)
GFR, Estimated: >60 mL/min (ref >60)
Glucose, Bld: 151 mg/dL — ABNORMAL HIGH (ref 70–99)
Potassium: 3.9 mmol/L (ref 3.5–5.1)
Sodium: 134 mmol/L — ABNORMAL LOW (ref 135–145)
Total Bilirubin: 0.5 mg/dL (ref <1.2)
Total Protein: 6.1 g/dL — ABNORMAL LOW (ref 6.5–8.1)

## 2023-08-07 LAB — GLUCOSE, CAPILLARY
Glucose-Capillary: 135 mg/dL — ABNORMAL HIGH (ref 70–99)
Glucose-Capillary: 145 mg/dL — ABNORMAL HIGH (ref 70–99)
Glucose-Capillary: 151 mg/dL — ABNORMAL HIGH (ref 70–99)
Glucose-Capillary: 152 mg/dL — ABNORMAL HIGH (ref 70–99)

## 2023-08-07 LAB — CBC
HCT: 28.3 % — ABNORMAL LOW (ref 36.0–46.0)
Hemoglobin: 8.9 g/dL — ABNORMAL LOW (ref 12.0–15.0)
MCH: 31.3 pg (ref 26.0–34.0)
MCHC: 31.4 g/dL (ref 30.0–36.0)
MCV: 99.6 fL (ref 80.0–100.0)
Platelets: 689 10*3/uL — ABNORMAL HIGH (ref 150–400)
RBC: 2.84 MIL/uL — ABNORMAL LOW (ref 3.87–5.11)
RDW: 14 % (ref 11.5–15.5)
WBC: 19 10*3/uL — ABNORMAL HIGH (ref 4.0–10.5)
nRBC: 0 % (ref 0.0–0.2)

## 2023-08-07 LAB — CULTURE, BLOOD (ROUTINE X 2)
Culture: NO GROWTH
Culture: NO GROWTH

## 2023-08-07 LAB — MAGNESIUM: Magnesium: 2.2 mg/dL (ref 1.7–2.4)

## 2023-08-07 MED ORDER — CYCLOBENZAPRINE HCL 10 MG PO TABS
10.0000 mg | ORAL_TABLET | Freq: Every day | ORAL | Status: DC
  Administered 2023-08-07 – 2023-08-11 (×5): 10 mg via ORAL
  Filled 2023-08-07 (×5): qty 1

## 2023-08-07 MED ORDER — SODIUM CHLORIDE 0.9 % IV SOLN
100.0000 mg | INTRAVENOUS | Status: DC
Start: 2023-08-08 — End: 2023-08-10
  Administered 2023-08-08 – 2023-08-09 (×3): 100 mg via INTRAVENOUS
  Filled 2023-08-07 (×3): qty 5

## 2023-08-07 MED ORDER — ENSURE ENLIVE PO LIQD
237.0000 mL | Freq: Three times a day (TID) | ORAL | Status: DC
  Administered 2023-08-07 – 2023-08-12 (×15): 237 mL via ORAL

## 2023-08-07 MED ORDER — ACETAMINOPHEN 500 MG PO TABS
1000.0000 mg | ORAL_TABLET | Freq: Three times a day (TID) | ORAL | Status: DC
  Administered 2023-08-07 – 2023-08-12 (×16): 1000 mg via ORAL
  Filled 2023-08-07 (×16): qty 2

## 2023-08-07 MED ORDER — GERHARDT'S BUTT CREAM
TOPICAL_CREAM | Freq: Two times a day (BID) | CUTANEOUS | Status: DC
  Administered 2023-08-10: 1 via TOPICAL
  Filled 2023-08-07: qty 60

## 2023-08-07 MED ORDER — TRACE MINERALS CU-MN-SE-ZN 300-55-60-3000 MCG/ML IV SOLN
INTRAVENOUS | Status: AC
  Filled 2023-08-07: qty 1000

## 2023-08-07 MED ORDER — FAT EMUL FISH OIL/PLANT BASED 20% (SMOFLIPID)IV EMUL
250.0000 mL | INTRAVENOUS | Status: AC
  Administered 2023-08-07: 250 mL via INTRAVENOUS
  Filled 2023-08-07: qty 250

## 2023-08-07 NOTE — Progress Notes (Signed)
PHARMACY - TOTAL PARENTERAL NUTRITION CONSULT NOTE   Indication: Prolonged ileus  Patient Measurements: Height: 5\' 1"  (154.9 cm) Weight: 65.6 kg (144 lb 10 oz) IBW/kg (Calculated) : 47.8 TPN AdjBW (KG): 52.7 Body mass index is 27.33 kg/m. Usual Weight:   Assessment:  Pharmacy is consulted to start TPN on 72 yo female with a prolonged ileus. Pt was taken emergently to OR on 12/5 after CT was concerning for closed loop bowel obstruction with ischemia.  Small bowel obstruction, perforation, jejunal ischemia s/p Resection and now with Ileus which is improved complicated by Intraabdominal Abscesses now   Glucose / Insulin:  - HbA1c 6.1%. prediabetic  - CBGs 96-151 - 10 units of insulin on SSI given in past 24 hours Electrolytes:  Lytes wnl exc Na 127>>134 improved, K 3.9 replaced. Cl low, CorrCa high at 10.76 (none in TPN) Renal:  SCr and BUN wnl and stable Hepatic:  - AST/ALT and Alk phos elevated but stable. Albumin low at 2.3, prealbumin 5 (12/10) Intake / Output; MIVF:  - UOP uncharted, LBM 12/16 - po Intake - On [id number] at 58ml/hr  GI Imaging: 12/13 Abdominal xray: Multiple fluid collections concerning for abscesses. Possible post op ileus and enteritis.  GI Surgeries / Procedures:  - 12/5 SBO and perforated closed loop bowel obstruction with ischemic jejunum   Central access: 12/10 TPN start date: 12/10   Nutritional Goals:   Clinimix E 8/10 1000 mL daily will provide 80 grams of protein at 1164 Kcal with Lipids. D10 at 60 ml/hr will provide 490 kcal for a total of 1654 kcal. This will meet both the protein and calorie goals   Due to the Baxter IV fluid disruption, all adult parenteral nutrition will utilize premade Clinimix products +/- fat emulsion infusion. Our goal will be to continue providing as close to 100% of our patient's nutritional needs, but due to limited TPN Clinimix concentrations we will meet at least 80% of protein and 75% of kcal goals.   RD  Assessment: Estimated Needs Total Energy Estimated Needs: 1600-1800 Total Protein Estimated Needs: 80-90g Total Fluid Estimated Needs: 1.8L/day  Current Nutrition:  NPO  Plan: At 1800: Clinimix 8/10 (add specific electrolytes) at 41 mL/hr Continue SMOFlipid daily over 12 hours Electrolytes in TPN (per liter, non-adjustable):  Na - 35 mEq/L K - 30 mEq/L Ca - 0 mEq/L Mg - 0 mEq/L Phos - 15 mmol/L Cl:Ac ratio - ~1:1 Add standard MVI and trace elements to TPN Continue thiamine 100mg  IV x 5 days Increase to moderate q6h SSI and adjust as needed  IVF D10 + NS + K+80meq at 61ml/hr ( K+ in 24hr) Will con't K+ in the IVF and recheck tomorrow Monitor TPN labs on Mon/Thurs and PRN CLD>FLD today  Kyria Bumgardner S. Merilynn Finland, PharmD, BCPS Clinical Staff Pharmacist 08/07/2023 9:46 AM

## 2023-08-07 NOTE — Progress Notes (Signed)
Referring Physician(s): Darnelle Spangle  Supervising Physician: Gilmer Mor  Patient Status:  [facility] IP  Chief Complaint:  Abdominal/pelvic fluid collections  Subjective: Pt doing fairly well; still has some abd soreness but improved; occ sweats; denies N/V   Allergies: Ciprofloxacin, Codeine, Sulfonamide derivatives, and Hydrocodone  Medications: Prior to Admission medications   Medication Sig Start Date End Date Taking? Authorizing Provider  acetaminophen (TYLENOL) 500 MG tablet Take 1,000 mg by mouth as needed for moderate pain (pain score 4-6).   Yes [provider]  albuterol (VENTOLIN HFA) 108 (90 Base) MCG/ACT inhaler Inhale 2 puffs into the lungs every 4 (four) hours as needed for wheezing or shortness of breath. 07/25/23  Yes [provider]  aspirin 325 MG tablet Take 325 mg by mouth daily.   Yes [provider]  cyclobenzaprine (FLEXERIL) 10 MG tablet Take 10 mg by mouth at bedtime. 06/04/23  Yes [provider]  diclofenac (VOLTAREN) 75 MG EC tablet Take 75 mg by mouth 3 (three) times daily.   Yes [provider]  escitalopram (LEXAPRO) 20 MG tablet Take 20 mg by mouth daily.   Yes [provider]  fluticasone (FLONASE) 50 MCG/ACT nasal spray Place 2 sprays into both nostrils as needed for allergies. 07/25/23  Yes [provider]  fluticasone-salmeterol (ADVAIR) 250-50 MCG/ACT AEPB Inhale 2 puffs into the lungs as needed (wheezing/SOB). 07/25/23  Yes [provider]  gabapentin (NEURONTIN) 300 MG capsule Take 300 mg by mouth 3 (three) times daily.   Yes [provider]  hydrochlorothiazide (HYDRODIURIL) 25 MG tablet Take 25 mg by mouth daily.   Yes [provider]  lisinopril (ZESTRIL) 10 MG tablet Take 10 mg by mouth at bedtime.   Yes [provider]  montelukast (SINGULAIR) 10 MG tablet Take 10 mg by mouth at bedtime.   Yes [provider]  simvastatin (ZOCOR) 40 MG  tablet Take 40 mg by mouth daily.   Yes [provider]  Sodium Citrate Dihydrate (EMETROL) 230 MG CHEW Chew 460 mg by mouth as needed (nausea).   Yes [provider]  ALPRAZolam Prudy Feeler) 0.5 MG tablet Take 0.5 mg by mouth in the morning and at bedtime.    [provider]  naproxen (NAPROSYN) 500 MG tablet Take 500 mg by mouth 2 (two) times daily with a meal. Patient not taking: Reported on 07/27/2023    [provider]     Vital Signs: BP (!) 143/62 (BP Location: Left Arm)   Pulse 73   Temp 98.6 F (37 C) (Oral)   Resp 17   Ht 5\' 1"  (1.549 m)   Wt 144 lb 10 oz (65.6 kg)   SpO2 98%   BMI 27.33 kg/m   Physical Exam; awake/alert; perihepatic/pelvic drains intact; mildly tender; OP 100 cc cream colored fluid from perihepatic drain, 65 cc serosang fluid from pelvic/TG drain  Imaging: CT GUIDED PERITONEAL/RETROPERITONEAL FLUID DRAIN BY PERC CATH Result Date: 08/06/2023 CLINICAL DATA:  Status post small bowel resection for closed loop obstruction with small bowel ischemia and perforation on 07/26/2023. Development of postoperative leukocytosis and fluid collections primarily adjacent to the liver and in the lower posterior pelvis. EXAM: 1. CT GUIDED PERCUTANEOUS CATHETER DRAINAGE OF RIGHT ABDOMINAL PERITONEAL ABSCESS 2. CT-GUIDED PERCUTANEOUS CATHETER DRAINAGE OF PELVIC PERITONEAL ABSCESS ANESTHESIA/SEDATION: Moderate (conscious) sedation was employed during this procedure. A total of Versed 2.5 mg and Fentanyl 100 mcg was administered intravenously. Moderate Sedation Time: 52 minutes. The patient's level of consciousness and  vital signs were monitored continuously by radiology nursing throughout the procedure under my direct supervision. PROCEDURE: The procedure, risks, benefits, and alternatives were explained to the patient. Questions regarding the procedure were encouraged and answered. The patient understands and consents to the procedure. A time out was  performed prior to initiating the procedure. Initial CT through the upper to mid abdomen was performed in a supine position. The right abdominal wall was prepped with chlorhexidine in a sterile fashion, and a sterile drape was applied covering the operative field. A sterile gown and sterile gloves were used for the procedure. Local anesthesia was provided with 1% Lidocaine. Under CT guidance, an 18 gauge trocar needle was advanced into a fluid collection along the inferior aspect of the liver. After confirming needle tip position, a guidewire was advanced. The tract was dilated over the guidewire and a 10 French percutaneous drainage catheter advanced. Final catheter position was confirmed by CT. A fluid sample was withdrawn and sent for culture analysis. The catheter was then connected to suction bulb drainage. It was secured at the skin exit site with a Prolene retention suture, adhesive StatLock device and overlying dressing. Imaging was performed in a left lateral decubitus position followed by a right lateral decubitus position with the left side up. From a left transgluteal approach, a new 18 gauge trocar needle was advanced into a deep pelvic fluid collection. Confirming needle tip position, a guidewire was advanced into the collection and wire position confirmed by CT. The percutaneous tract was dilated over the guidewire and a 10 French percutaneous drainage catheter advanced over the wire. Final catheter position was confirmed by CT. A fluid sample was withdrawn from the drain and sent for culture analysis. The catheter was connected to suction bulb drainage. It was secured at the skin exit site with a Prolene retention suture, adhesive StatLock device and overlying dressing. RADIATION DOSE REDUCTION: This exam was performed according to the departmental dose-optimization program which includes automated exposure control, adjustment of the mA and/or kV according to patient size and/or use of iterative  reconstruction technique. COMPLICATIONS: None FINDINGS: The fluid collection spanning along much of the anterolateral aspect of the liver was first addressed. From an anterior approach, the inferior aspect of this collection was punctured in a cranial direction such that the guidewire advanced superiorly towards the superior aspect of the collection. After drain placement, the drainage catheter extends up towards the superior aspect. There was return of purulent fluid. Initial decubitus imaging with the right side up was not optimal for approach to the posterior pelvic fluid collection. From the opposite decubitus position, a left transgluteal approach was chosen for access. The fluid collection yielded dark bloody fluid. IMPRESSION: 1. Right-sided perihepatic abscess yielded purulent fluid. A 10 French drainage catheter was placed and attached to suction bulb drainage. A sample of purulent fluid from this abscess was sent for culture analysis. 2. Left transgluteal approach to the deep pelvic abscess yielded dark bloody fluid. A 10 French drainage catheter was placed and attached to suction bulb drainage. A sample of bloody fluid from this collection was sent for culture analysis. Electronically Signed   By: Irish Lack M.D.   On: 08/06/2023 15:40   CT GUIDED PERITONEAL/RETROPERITONEAL FLUID DRAIN BY PERC CATH Result Date: 08/06/2023 CLINICAL DATA:  Status post small bowel resection for closed loop obstruction with small bowel ischemia and perforation on 07/26/2023. Development of postoperative leukocytosis and fluid collections primarily adjacent to the liver and in the lower posterior pelvis. EXAM:  1. CT GUIDED PERCUTANEOUS CATHETER DRAINAGE OF RIGHT ABDOMINAL PERITONEAL ABSCESS 2. CT-GUIDED PERCUTANEOUS CATHETER DRAINAGE OF PELVIC PERITONEAL ABSCESS ANESTHESIA/SEDATION: Moderate (conscious) sedation was employed during this procedure. A total of Versed 2.5 mg and Fentanyl 100 mcg was administered  intravenously. Moderate Sedation Time: 52 minutes. The patient's level of consciousness and vital signs were monitored continuously by radiology nursing throughout the procedure under my direct supervision. PROCEDURE: The procedure, risks, benefits, and alternatives were explained to the patient. Questions regarding the procedure were encouraged and answered. The patient understands and consents to the procedure. A time out was performed prior to initiating the procedure. Initial CT through the upper to mid abdomen was performed in a supine position. The right abdominal wall was prepped with chlorhexidine in a sterile fashion, and a sterile drape was applied covering the operative field. A sterile gown and sterile gloves were used for the procedure. Local anesthesia was provided with 1% Lidocaine. Under CT guidance, an 18 gauge trocar needle was advanced into a fluid collection along the inferior aspect of the liver. After confirming needle tip position, a guidewire was advanced. The tract was dilated over the guidewire and a 10 French percutaneous drainage catheter advanced. Final catheter position was confirmed by CT. A fluid sample was withdrawn and sent for culture analysis. The catheter was then connected to suction bulb drainage. It was secured at the skin exit site with a Prolene retention suture, adhesive StatLock device and overlying dressing. Imaging was performed in a left lateral decubitus position followed by a right lateral decubitus position with the left side up. From a left transgluteal approach, a new 18 gauge trocar needle was advanced into a deep pelvic fluid collection. Confirming needle tip position, a guidewire was advanced into the collection and wire position confirmed by CT. The percutaneous tract was dilated over the guidewire and a 10 French percutaneous drainage catheter advanced over the wire. Final catheter position was confirmed by CT. A fluid sample was withdrawn from the drain and  sent for culture analysis. The catheter was connected to suction bulb drainage. It was secured at the skin exit site with a Prolene retention suture, adhesive StatLock device and overlying dressing. RADIATION DOSE REDUCTION: This exam was performed according to the departmental dose-optimization program which includes automated exposure control, adjustment of the mA and/or kV according to patient size and/or use of iterative reconstruction technique. COMPLICATIONS: None FINDINGS: The fluid collection spanning along much of the anterolateral aspect of the liver was first addressed. From an anterior approach, the inferior aspect of this collection was punctured in a cranial direction such that the guidewire advanced superiorly towards the superior aspect of the collection. After drain placement, the drainage catheter extends up towards the superior aspect. There was return of purulent fluid. Initial decubitus imaging with the right side up was not optimal for approach to the posterior pelvic fluid collection. From the opposite decubitus position, a left transgluteal approach was chosen for access. The fluid collection yielded dark bloody fluid. IMPRESSION: 1. Right-sided perihepatic abscess yielded purulent fluid. A 10 French drainage catheter was placed and attached to suction bulb drainage. A sample of purulent fluid from this abscess was sent for culture analysis. 2. Left transgluteal approach to the deep pelvic abscess yielded dark bloody fluid. A 10 French drainage catheter was placed and attached to suction bulb drainage. A sample of bloody fluid from this collection was sent for culture analysis. Electronically Signed   By: Irish Lack M.D.   On: 08/06/2023  15:40    Labs:  CBC: Recent Labs    08/04/23 0009 08/05/23 0253 08/06/23 0231 08/07/23 0227  WBC 26.5* 25.0* 24.8* 19.0*  HGB 9.2* 9.3* 9.1* 8.9*  HCT 26.8* 29.1* 28.7* 28.3*  PLT 457* 552* 627* 689*    COAGS: Recent Labs     08/05/23 0253  INR 1.1    BMP: Recent Labs    08/04/23 0009 08/05/23 0253 08/06/23 0231 08/07/23 0227  NA 128* 130* 127* 134*  K 3.7 3.3* 3.4* 3.9  CL 97* 100 97* 104  CO2 24 23 24 23   GLUCOSE 125* 143* 125* 151*  BUN 17 16 16 17   CALCIUM 9.4 9.5 9.2 9.4  CREATININE 0.53 0.36* 0.50 0.59  GFRNONAA >60 >60 >60 >60    LIVER FUNCTION TESTS: Recent Labs    08/04/23 0009 08/05/23 0253 08/06/23 0231 08/07/23 0227  BILITOT 0.6 0.5 0.5 0.5  AST 49* 49* 44* 41  ALT 81* 85* 84* 85*  ALKPHOS 230* 234* 247* 235*  PROT 5.9* 5.9* 6.1* 6.1*  ALBUMIN 2.2* 2.3* 2.3* 2.3*    Assessment and Plan: 72 yo female with history of loop small bowel obstruction with ischemic small bowel perforation, status post laparoscopy converted to open small bowel resection on 12/5; status post drainage of post op perihepatic and left pelvic abscesses on 12/16;  Afebrile, WBC 19 down from 24.8, hemoglobin 8.9 down from 9.1, creatinine normal; drain fluid cultures pending  Output by Drain (mL) 08/05/23 0701 - 08/05/23 1900 08/05/23 1901 - 08/06/23 0700 08/06/23 0701 - 08/06/23 1900 08/06/23 1901 - 08/07/23 0700 08/07/23 0701 - 08/07/23 1516  Closed System Drain 1 RUQ Bulb (JP) 10 Fr.   80 20   Closed System Drain 1 Left Buttock Bulb (JP) 10 Fr.   30 35   Negative Pressure Wound Therapy Abdomen Medial         Continue current treatment, drain irrigation, close output follow-up, lab checks; obtain follow-up CT once drain output minimal or if WBC abruptly increases; other plans as per CCS/TRH/ID   Electronically Signed: D. Jeananne Rama, PA-C 08/07/2023, 3:09 PM   I spent a total of 15 Minutes at the the patient's bedside AND on the patient's hospital floor or unit, greater than 50% of which was counseling/coordinating care for  abdominal/pelvic abscess drains    Patient ID: Laura Bradshaw, female   DOB: 1950/11/08, 72 y.o.   MRN: [id number]

## 2023-08-07 NOTE — Progress Notes (Signed)
12 Days Post-Op   Subjective/Chief Complaint: Feeling better. Pain controlled. Tolerating clears. Reports a small, loose, dark stool yesterday. Got OOB to chair.   Objective: Vital signs in last 24 hours: Temp:  [97.9 F (36.6 C)-98 F (36.7 C)] 98 F (36.7 C) (12/17 0449) Pulse Rate:  [71-83] 83 (12/17 0449) Resp:  [13-20] 16 (12/17 0449) BP: (108-151)/(46-98) 151/66 (12/17 0449) SpO2:  [97 %-100 %] 97 % (12/17 0827) Weight:  [65.6 kg] 65.6 kg (12/17 0449) Last BM Date : 08/06/23  Intake/Output from previous day: 12/16 0701 - 12/17 0700 In: 5395.2 [P.O.:200; I.V.:4431.8; IV Piggyback:763.4] Out: 165 [Drains:165] Intake/Output this shift: No intake/output data recorded.  Alert, NAD Resp: slightly labored on nasal cannula  Abd: soft, appropraitely tender, no peritonitis, vac in place holding suction, laparoscopic incisions c/d/I without cellulitis. Right IR drain w/ purulent fluid, transgluteal drain with minimal, cloudy SS fluid   Lab Results:  Recent Labs    08/06/23 0231 08/07/23 0227  WBC 24.8* 19.0*  HGB 9.1* 8.9*  HCT 28.7* 28.3*  PLT 627* 689*   BMET Recent Labs    08/06/23 0231 08/07/23 0227  NA 127* 134*  K 3.4* 3.9  CL 97* 104  CO2 24 23  GLUCOSE 125* 151*  BUN 16 17  CREATININE 0.50 0.59  CALCIUM 9.2 9.4   PT/INR Recent Labs    08/05/23 0253  LABPROT 13.9  INR 1.1   ABG No results for input(s): "PHART", "HCO3" in the last 72 hours.  Invalid input(s): "PCO2", "PO2"  Studies/Results: CT GUIDED PERITONEAL/RETROPERITONEAL FLUID DRAIN BY PERC CATH Result Date: 08/06/2023 CLINICAL DATA:  Status post small bowel resection for closed loop obstruction with small bowel ischemia and perforation on 07/26/2023. Development of postoperative leukocytosis and fluid collections primarily adjacent to the liver and in the lower posterior pelvis. EXAM: 1. CT GUIDED PERCUTANEOUS CATHETER DRAINAGE OF RIGHT ABDOMINAL PERITONEAL ABSCESS 2. CT-GUIDED PERCUTANEOUS  CATHETER DRAINAGE OF PELVIC PERITONEAL ABSCESS ANESTHESIA/SEDATION: Moderate (conscious) sedation was employed during this procedure. A total of Versed 2.5 mg and Fentanyl 100 mcg was administered intravenously. Moderate Sedation Time: 52 minutes. The patient's level of consciousness and vital signs were monitored continuously by radiology nursing throughout the procedure under my direct supervision. PROCEDURE: The procedure, risks, benefits, and alternatives were explained to the patient. Questions regarding the procedure were encouraged and answered. The patient understands and consents to the procedure. A time out was performed prior to initiating the procedure. Initial CT through the upper to mid abdomen was performed in a supine position. The right abdominal wall was prepped with chlorhexidine in a sterile fashion, and a sterile drape was applied covering the operative field. A sterile gown and sterile gloves were used for the procedure. Local anesthesia was provided with 1% Lidocaine. Under CT guidance, an 18 gauge trocar needle was advanced into a fluid collection along the inferior aspect of the liver. After confirming needle tip position, a guidewire was advanced. The tract was dilated over the guidewire and a 10 French percutaneous drainage catheter advanced. Final catheter position was confirmed by CT. A fluid sample was withdrawn and sent for culture analysis. The catheter was then connected to suction bulb drainage. It was secured at the skin exit site with a Prolene retention suture, adhesive StatLock device and overlying dressing. Imaging was performed in a left lateral decubitus position followed by a right lateral decubitus position with the left side up. From a left transgluteal approach, a new 18 gauge trocar needle was advanced into  a deep pelvic fluid collection. Confirming needle tip position, a guidewire was advanced into the collection and wire position confirmed by CT. The percutaneous tract  was dilated over the guidewire and a 10 French percutaneous drainage catheter advanced over the wire. Final catheter position was confirmed by CT. A fluid sample was withdrawn from the drain and sent for culture analysis. The catheter was connected to suction bulb drainage. It was secured at the skin exit site with a Prolene retention suture, adhesive StatLock device and overlying dressing. RADIATION DOSE REDUCTION: This exam was performed according to the departmental dose-optimization program which includes automated exposure control, adjustment of the mA and/or kV according to patient size and/or use of iterative reconstruction technique. COMPLICATIONS: None FINDINGS: The fluid collection spanning along much of the anterolateral aspect of the liver was first addressed. From an anterior approach, the inferior aspect of this collection was punctured in a cranial direction such that the guidewire advanced superiorly towards the superior aspect of the collection. After drain placement, the drainage catheter extends up towards the superior aspect. There was return of purulent fluid. Initial decubitus imaging with the right side up was not optimal for approach to the posterior pelvic fluid collection. From the opposite decubitus position, a left transgluteal approach was chosen for access. The fluid collection yielded dark bloody fluid. IMPRESSION: 1. Right-sided perihepatic abscess yielded purulent fluid. A 10 French drainage catheter was placed and attached to suction bulb drainage. A sample of purulent fluid from this abscess was sent for culture analysis. 2. Left transgluteal approach to the deep pelvic abscess yielded dark bloody fluid. A 10 French drainage catheter was placed and attached to suction bulb drainage. A sample of bloody fluid from this collection was sent for culture analysis. Electronically Signed   By: Irish Lack M.D.   On: 08/06/2023 15:40   CT GUIDED PERITONEAL/RETROPERITONEAL FLUID DRAIN  BY PERC CATH Result Date: 08/06/2023 CLINICAL DATA:  Status post small bowel resection for closed loop obstruction with small bowel ischemia and perforation on 07/26/2023. Development of postoperative leukocytosis and fluid collections primarily adjacent to the liver and in the lower posterior pelvis. EXAM: 1. CT GUIDED PERCUTANEOUS CATHETER DRAINAGE OF RIGHT ABDOMINAL PERITONEAL ABSCESS 2. CT-GUIDED PERCUTANEOUS CATHETER DRAINAGE OF PELVIC PERITONEAL ABSCESS ANESTHESIA/SEDATION: Moderate (conscious) sedation was employed during this procedure. A total of Versed 2.5 mg and Fentanyl 100 mcg was administered intravenously. Moderate Sedation Time: 52 minutes. The patient's level of consciousness and vital signs were monitored continuously by radiology nursing throughout the procedure under my direct supervision. PROCEDURE: The procedure, risks, benefits, and alternatives were explained to the patient. Questions regarding the procedure were encouraged and answered. The patient understands and consents to the procedure. A time out was performed prior to initiating the procedure. Initial CT through the upper to mid abdomen was performed in a supine position. The right abdominal wall was prepped with chlorhexidine in a sterile fashion, and a sterile drape was applied covering the operative field. A sterile gown and sterile gloves were used for the procedure. Local anesthesia was provided with 1% Lidocaine. Under CT guidance, an 18 gauge trocar needle was advanced into a fluid collection along the inferior aspect of the liver. After confirming needle tip position, a guidewire was advanced. The tract was dilated over the guidewire and a 10 French percutaneous drainage catheter advanced. Final catheter position was confirmed by CT. A fluid sample was withdrawn and sent for culture analysis. The catheter was then connected to suction bulb drainage. It  was secured at the skin exit site with a Prolene retention suture,  adhesive StatLock device and overlying dressing. Imaging was performed in a left lateral decubitus position followed by a right lateral decubitus position with the left side up. From a left transgluteal approach, a new 18 gauge trocar needle was advanced into a deep pelvic fluid collection. Confirming needle tip position, a guidewire was advanced into the collection and wire position confirmed by CT. The percutaneous tract was dilated over the guidewire and a 10 French percutaneous drainage catheter advanced over the wire. Final catheter position was confirmed by CT. A fluid sample was withdrawn from the drain and sent for culture analysis. The catheter was connected to suction bulb drainage. It was secured at the skin exit site with a Prolene retention suture, adhesive StatLock device and overlying dressing. RADIATION DOSE REDUCTION: This exam was performed according to the departmental dose-optimization program which includes automated exposure control, adjustment of the mA and/or kV according to patient size and/or use of iterative reconstruction technique. COMPLICATIONS: None FINDINGS: The fluid collection spanning along much of the anterolateral aspect of the liver was first addressed. From an anterior approach, the inferior aspect of this collection was punctured in a cranial direction such that the guidewire advanced superiorly towards the superior aspect of the collection. After drain placement, the drainage catheter extends up towards the superior aspect. There was return of purulent fluid. Initial decubitus imaging with the right side up was not optimal for approach to the posterior pelvic fluid collection. From the opposite decubitus position, a left transgluteal approach was chosen for access. The fluid collection yielded dark bloody fluid. IMPRESSION: 1. Right-sided perihepatic abscess yielded purulent fluid. A 10 French drainage catheter was placed and attached to suction bulb drainage. A sample of  purulent fluid from this abscess was sent for culture analysis. 2. Left transgluteal approach to the deep pelvic abscess yielded dark bloody fluid. A 10 French drainage catheter was placed and attached to suction bulb drainage. A sample of bloody fluid from this collection was sent for culture analysis. Electronically Signed   By: Irish Lack M.D.   On: 08/06/2023 15:40     Anti-infectives: Anti-infectives (From admission, onward)    Start     Dose/Rate Route Frequency Ordered Stop   08/07/23 0000  micafungin (MYCAMINE) 150 mg in sodium chloride 0.9 % 100 mL IVPB        150 mg 107.5 mL/hr over 1 Hours Intravenous Every 24 hours 08/06/23 2301     07/28/23 1330  piperacillin-tazobactam (ZOSYN) IVPB 3.375 g        3.375 g 12.5 mL/hr over 240 Minutes Intravenous Every 8 hours 07/28/23 1232     07/26/23 1745  piperacillin-tazobactam (ZOSYN) IVPB 3.375 g        3.375 g 12.5 mL/hr over 240 Minutes Intravenous  Once 07/26/23 1737 07/26/23 1931       Assessment/Plan: Closed loop SBO with ischemic small bowel and perforation  POD 11 s/p laparoscopy converted to open small bowel resection Dr. Maisie Fus - post-op ileus seems to be resolving - s/p IR drain x2 on 12/16; follow Cx, abx per ID - advance to FLD and ensure. If tolerates PO well today then may be able to wean TPN starting tomorrow. - VAC M/W/F - continue to mobilize as tolerated - K pad ordered for back pain. Lidoderm patches ordered for back and abdominal pain. Continue tylenol.    Severe protein calorie malnutrition - PICC and TPN,  prealbumin  5 12/10   FEN: FLD PICC/TPN VTE: LMWH ID: Zosyn 12/5>>, micafungin 12/17 >> started by ID for budding yeast on cultures   Laura Bradshaw 08/07/2023

## 2023-08-07 NOTE — Progress Notes (Addendum)
Nutrition Follow-up  DOCUMENTATION CODES:   Non-severe (moderate) malnutrition in context of acute illness/injury  INTERVENTION:   -Ensure Plus High Protein po TID, each supplement provides 350 kcal and 20 grams of protein.   -TPN management per Pharmacy -Continue 100 mg Thiamine daily x 5 days -Daily weights while on TPN  NUTRITION DIAGNOSIS:   Moderate Malnutrition related to acute illness as evidenced by mild fat depletion, mild muscle depletion, energy intake < 75% for > 7 days.  Ongoing.  GOAL:   Patient will meet greater than or equal to 90% of their needs  Progressing.  MONITOR:   Diet advancement, Weight trends, I & O's, Labs (TPN)  ASSESSMENT:   72 y.o. female with PMH of HTN, anxiety, depression, COPD, HSV, low back pain and mild dementia presenting with acute abdominal pain and vomiting. She was found to have closed-loop bowel obstruction and ischemia, taken to the OR for laparoscopic, converted to open, small bowel resection with intraoperative findings of perforated closed loop obstruction with ischemic jejunum.  12/5: admitted, s/p open SB resection 12/9: NGT placed for suction, Wound VAC placed, CLD 12/10: NPO 12/12: CLD 12/13: NPO 12/14: NGT removed 12/16: CLD, s/p CT Guided Drainage of right abdominal and pelvic abscesses   Patient now on full liquids. Ensure has been ordered. Per surgery note, if tolerates diet advancement, TPN to d/c.   TPN continues: Clinimix E 8/10 @ 41 ml/hr + SMOFLIPID infusion, provides 1129 kcals (70% of needs) and 78g protein (97% of needs). D10 infusion at 60 ml/hr is also providing ~490 kcals to better meet kcal needs.  Admission weight: 149 lbs Current weight: 144 lbs  Medications: Thiamine, D10/NS/K20  Labs reviewed: CBGs: 96-145 Low Na   Diet Order:   Diet Order             Diet full liquid Fluid consistency: Thin  Diet effective now                   EDUCATION NEEDS:   Education needs have been  addressed  Skin:  Skin Assessment: Skin Integrity Issues: Skin Integrity Issues:: Incisions Incisions: 12/5 abdomen  Last BM:  12/16 -type 7  Height:   Ht Readings from Last 1 Encounters:  07/26/23 5\' 1"  (1.549 m)    Weight:   Wt Readings from Last 1 Encounters:  08/07/23 65.6 kg    BMI:  Body mass index is 27.33 kg/m.  Estimated Nutritional Needs:   Kcal:  1600-1800  Protein:  80-90g  Fluid:  1.8L/day  Tilda Franco, MS, RD, LDN Inpatient Clinical Dietitian Contact via Secure chat

## 2023-08-07 NOTE — Progress Notes (Signed)
Physical Therapy Treatment Patient Details Name: Laura Bradshaw MRN: [id number] DOB: [birth year] Today's Date: 08/07/2023   History of Present Illness Pt is 72 yo female admitted on 07/26/23 with SBO, perforation, jejunal ischemia and is s/p open small bowel resection on 07/26/23.  Midline wound open - possible wound vac placement Monday, 12/9.  Pt with hx including but not limited to HTN, anxiety, depression, COPD, HSV, low back pain, mild dementia.    PT Comments  General Comments: AxO x 3 very sweet Lady.  Nervous.  Feeling poorly.  Daughter and Son in [place] present.  Very supportive. Assisted OOB to amb required + 2 assist and increased time.  General bed mobility comments: Cues for log roll, using rail and increased assist this session.  General transfer comment: + 2 assist from elevated bed VC's on proper hand placement.  General Gait Details: required + 2 side by side assist (Pt nervous) with VC's on proper walker to self distance.  Son In [place] following with recliner.  Tolerated amb in hallway 55 feet with avg RA 92% and HR 91.  Max c/o weakness/fatigue. Positioned in recliner to comfort.  Reapplied 2 lts oxygen.  Pt plans to return home with family support.     If plan is discharge home, recommend the following: A little help with walking and/or transfers;A little help with bathing/dressing/bathroom;Help with stairs or ramp for entrance;Assistance with cooking/housework   Can travel by private Scientist, research (medical) walker (2 wheels)    Recommendations for Other Services       Precautions / Restrictions Precautions Precautions: Fall Precaution Comments: abdominal wound vac, 2 bulb drains Restrictions Weight Bearing Restrictions Per Provider Order: No     Mobility  Bed Mobility Overal bed mobility: Needs Assistance Bed Mobility: Rolling, Sidelying to Sit Rolling: Min assist, Mod assist Sidelying to sit: Mod assist, Max assist       General bed  mobility comments: Cues for log roll, using rail and increased assist this session    Transfers Overall transfer level: Needs assistance Equipment used: Rolling walker (2 wheels) Transfers: Sit to/from Stand Sit to Stand: Min assist, Mod assist, +2 safety/equipment, +2 physical assistance           General transfer comment: + 2 assist from elevated bed VC's on proper hand placement    Ambulation/Gait Ambulation/Gait assistance: Min assist, Mod assist, +2 physical assistance, +2 safety/equipment Gait Distance (Feet): 55 Feet Assistive device: Rolling walker (2 wheels) Gait Pattern/deviations: Decreased stride length, Step-through pattern Gait velocity: decreased     General Gait Details: required + 2 side by side assist (Pt nervous) with VC's on proper walker to self distance.  Son In [place] following with recliner.  Tolerated amb in hallway 55 feet with avg RA 92% and HR 91.  Max c/o weakness/fatigue.   Stairs             Wheelchair Mobility     Tilt Bed    Modified Rankin (Stroke Patients Only)       Balance                                            Cognition Arousal: Alert Behavior During Therapy: WFL for tasks assessed/performed Overall Cognitive Status: Within Functional Limits for tasks assessed  General Comments: AxO x 3 very sweet Lady.  Nervous.  Feeling poorly.  Daughter and Son in [place] present.  Very supportive.        Exercises      General Comments        Pertinent Vitals/Pain Pain Assessment Pain Assessment: Faces Faces Pain Scale: Hurts a little bit Pain Location: L hip Pain Descriptors / Indicators: Grimacing, Guarding Pain Intervention(s): Repositioned, Monitored during session    Home Living                          Prior Function            PT Goals (current goals can now be found in the care plan section) Progress towards PT goals: Progressing  toward goals    Frequency    Min 1X/week      PT Plan      Co-evaluation              AM-PAC PT "6 Clicks" Mobility   Outcome Measure  Help needed turning from your back to your side while in a flat bed without using bedrails?: A Lot Help needed moving from lying on your back to sitting on the side of a flat bed without using bedrails?: A Lot Help needed moving to and from a bed to a chair (including a wheelchair)?: A Lot Help needed standing up from a chair using your arms (e.g., wheelchair or bedside chair)?: A Lot Help needed to walk in hospital room?: A Lot Help needed climbing 3-5 steps with a railing? : A Lot 6 Click Score: 12    End of Session Equipment Utilized During Treatment: Gait belt Activity Tolerance: Patient limited by fatigue Patient left: in chair;with call bell/phone within reach;with family/visitor present Nurse Communication: Mobility status PT Visit Diagnosis: Difficulty in walking, not elsewhere classified (R26.2)     Time: 8841-6606 PT Time Calculation (min) (ACUTE ONLY): 24 min  Charges:    $Gait Training: 8-22 mins $Therapeutic Activity: 8-22 mins                       Felecia Shelling  PTA Acute  Rehabilitation Services Office M-F          [phone]

## 2023-08-07 NOTE — Progress Notes (Signed)
Subjective:  No new complaints   Antibiotics:  Anti-infectives (From admission, onward)    Start     Dose/Rate Route Frequency Ordered Stop   08/08/23 0000  micafungin (MYCAMINE) 100 mg in sodium chloride 0.9 % 100 mL IVPB        100 mg 105 mL/hr over 1 Hours Intravenous Every 24 hours 08/07/23 1549     08/07/23 0000  micafungin (MYCAMINE) 150 mg in sodium chloride 0.9 % 100 mL IVPB  Status:  Discontinued        150 mg 107.5 mL/hr over 1 Hours Intravenous Every 24 hours 08/06/23 2301 08/07/23 1549   07/28/23 1330  piperacillin-tazobactam (ZOSYN) IVPB 3.375 g        3.375 g 12.5 mL/hr over 240 Minutes Intravenous Every 8 hours 07/28/23 1232     07/26/23 1745  piperacillin-tazobactam (ZOSYN) IVPB 3.375 g        3.375 g 12.5 mL/hr over 240 Minutes Intravenous  Once 07/26/23 1737 07/26/23 1931       Medications: Scheduled Meds:  acetaminophen  1,000 mg Oral Q8H   Chlorhexidine Gluconate Cloth  6 each Topical Daily   cyclobenzaprine  10 mg Oral QHS   enoxaparin (LOVENOX) injection  30 mg Subcutaneous Q24H   escitalopram  20 mg Oral Daily   feeding supplement  237 mL Oral TID WC   Gerhardt's butt cream   Topical BID   insulin aspart  0-15 Units Subcutaneous Q6H   lidocaine  2 patch Transdermal Q24H   lisinopril  10 mg Oral QHS   mometasone-formoterol  2 puff Inhalation BID   montelukast  10 mg Oral QHS   nicotine  21 mg Transdermal Daily   simvastatin  40 mg Oral Daily   sodium chloride flush  10-40 mL Intracatheter Q12H   sodium chloride flush  5 mL Intracatheter Q8H   thiamine (VITAMIN B1) injection  100 mg Intravenous Daily   Continuous Infusions:  TPN (CLINIMIX) Adult with Electrolyte Additives 41 mL/hr at 08/07/23 1740   And   fat emul(SMOFlipid) 250 mL (08/07/23 1740)   [START ON 08/08/2023] micafungin (MYCAMINE) 100 mg in sodium chloride 0.9 % 100 mL IVPB     piperacillin-tazobactam (ZOSYN)  IV 3.375 g (08/07/23 1339)   sodium chloride 154 mEq/L,  potassium chloride 20 mEq/L in dextrose 10 % 1,000 mL Pediatric IV infusion 60 mL/hr at 08/07/23 0632   PRN Meds:.albuterol, fluticasone, guaiFENesin-dextromethorphan, hydrALAZINE, ketorolac, LORazepam, methocarbamol (ROBAXIN) injection, metoprolol tartrate, morphine injection, ondansetron **OR** ondansetron (ZOFRAN) IV, simethicone, sodium chloride flush    Objective: Weight change: 0.5 kg  Intake/Output Summary (Last 24 hours) at 08/07/2023 1930 Last data filed at 08/07/2023 0981 Gross per 24 hour  Intake 1007.04 ml  Output 55 ml  Net 952.04 ml   Blood pressure (!) 143/62, pulse 73, temperature 98.6 F (37 C), temperature source Oral, resp. rate 17, height 5\' 1"  (1.549 m), weight 65.6 kg, SpO2 98%. Temp:  [97.9 F (36.6 C)-98.6 F (37 C)] 98.6 F (37 C) (12/17 1208) Pulse Rate:  [73-83] 73 (12/17 1208) Resp:  [16-17] 17 (12/17 1208) BP: (143-151)/(62-66) 143/62 (12/17 1208) SpO2:  [97 %-99 %] 98 % (12/17 1208) Weight:  [65.6 kg] 65.6 kg (12/17 0449)  Physical Exam: Physical Exam Constitutional:      General: She is not in acute distress.    Appearance: She is well-developed. She is not diaphoretic.  HENT:     Head: Normocephalic and atraumatic.  Right Ear: External ear normal.     Left Ear: External ear normal.     Mouth/Throat:     Pharynx: No oropharyngeal exudate.  Eyes:     General: No scleral icterus.    Conjunctiva/sclera: Conjunctivae normal.     Pupils: Pupils are equal, round, and reactive to light.  Cardiovascular:     Rate and Rhythm: Normal rate and regular rhythm.  Pulmonary:     Effort: Pulmonary effort is normal. No respiratory distress.     Breath sounds: Normal breath sounds. No wheezing.  Abdominal:     Palpations: Abdomen is soft.  Musculoskeletal:        General: No tenderness. Normal range of motion.  Lymphadenopathy:     Cervical: No cervical adenopathy.  Skin:    General: Skin is warm and dry.     Coloration: Skin is not pale.      Findings: No erythema or rash.  Neurological:     General: No focal deficit present.     Mental Status: She is alert and oriented to person, place, and time.     Motor: No abnormal muscle tone.     Coordination: Coordination normal.  Psychiatric:        Mood and Affect: Mood normal.        Behavior: Behavior normal.        Thought Content: Thought content normal.        Judgment: Judgment normal.     Frank pus in JP drain on the right, left one with less material  CBC:    BMET Recent Labs    08/06/23 0231 08/07/23 0227  NA 127* 134*  K 3.4* 3.9  CL 97* 104  CO2 24 23  GLUCOSE 125* 151*  BUN 16 17  CREATININE 0.50 0.59  CALCIUM 9.2 9.4     Liver Panel  Recent Labs    08/06/23 0231 08/07/23 0227  PROT 6.1* 6.1*  ALBUMIN 2.3* 2.3*  AST 44* 41  ALT 84* 85*  ALKPHOS 247* 235*  BILITOT 0.5 0.5       Sedimentation Rate No results for input(s): "ESRSEDRATE" in the last 72 hours. C-Reactive Protein No results for input(s): "CRP" in the last 72 hours.  Micro Results: Recent Results (from the past 720 hours)  Blood culture (routine x 2)     Status: None   Collection Time: 07/26/23  9:16 PM   Specimen: BLOOD  Result Value Ref Range Status   Specimen Description   Final    BLOOD BLOOD RIGHT HAND Performed at [facility], [address], [place], [place] [address]    Special Requests   Final    BOTTLES DRAWN AEROBIC AND ANAEROBIC Blood Culture results may not be optimal due to an inadequate volume of blood received in culture bottles Performed at [facility], [address] [place], [place] [address]    Culture   Final    NO GROWTH 5 DAYS Performed at [facility], [address], [place], [place] [address]    Report Status 07/31/2023 FINAL  Final  Blood culture (routine x 2)     Status: None   Collection Time: 07/26/23  9:16 PM   Specimen: BLOOD  Result Value Ref Range Status   Specimen Description    Final    BLOOD RIGHT ANTECUBITAL Performed at [facility], [address], [place], [place] [address]    Special Requests   Final  BOTTLES DRAWN AEROBIC AND ANAEROBIC Blood Culture adequate volume Performed at [facility], [address], [place], [place] [address]    Culture   Final    NO GROWTH 5 DAYS Performed at [facility], [address], [place], [place] [address]    Report Status 07/31/2023 FINAL  Final  Culture, blood (Routine X 2) w Reflex to ID Panel     Status: None   Collection Time: 08/02/23  4:08 PM   Specimen: BLOOD LEFT ARM  Result Value Ref Range Status   Specimen Description   Final    BLOOD LEFT ARM Performed at [facility], [address] [place], [place] [address]    Special Requests   Final    BOTTLES DRAWN AEROBIC AND ANAEROBIC Blood Culture results may not be optimal due to an inadequate volume of blood received in culture bottles Performed at [facility], [address], [place], [place] [address]    Culture   Final    NO GROWTH 5 DAYS Performed at [facility], [address] [place], [place] [address]    Report Status 08/07/2023 FINAL  Final  Culture, blood (Routine X 2) w Reflex to ID Panel     Status: None   Collection Time: 08/02/23  4:16 PM   Specimen: BLOOD RIGHT HAND  Result Value Ref Range Status   Specimen Description   Final    BLOOD RIGHT HAND Performed at [facility], [address], [place], [place] [address]    Special Requests   Final    BOTTLES DRAWN AEROBIC AND ANAEROBIC Blood Culture results may not be optimal due to an inadequate volume of blood received in culture bottles Performed at [facility], [address] [place], [place] [address]    Culture   Final    NO GROWTH 5 DAYS Performed at [facility], [address], [place], [place] [address]    Report Status 08/07/2023 FINAL  Final  Aerobic/Anaerobic  Culture w Gram Stain (surgical/deep wound)     Status: None (Preliminary result)   Collection Time: 08/06/23  1:49 PM   Specimen: Abscess  Result Value Ref Range Status   Specimen Description   Final    ABSCESS Performed at [facility], [address] [place], [place] [address]    Special Requests   Final    Normal Performed at [facility], [address], [place], [place] [address]    Gram Stain   Final    ABUNDANT WBC PRESENT, PREDOMINANTLY PMN RARE BUDDING YEAST SEEN    Culture   Final    NO GROWTH < 24 HOURS Performed at [facility], [address] [place], [place] [address]    Report Status PENDING  Incomplete  Aerobic/Anaerobic Culture w Gram Stain (surgical/deep wound)     Status: None (Preliminary result)   Collection Time: 08/06/23  1:50 PM   Specimen: Abscess  Result Value Ref Range Status   Specimen Description   Final    ABSCESS Performed at [facility], [address] [place], [place] [address]    Special Requests   Final    Normal Performed at [facility], [address], [place], [place] [address]    Gram Stain   Final    FEW WBC PRESENT,BOTH PMN AND MONONUCLEAR NO ORGANISMS SEEN    Culture   Final  NO GROWTH < 24 HOURS Performed at [facility], [address], [place], [place] [address]    Report Status PENDING  Incomplete    Studies/Results: CT GUIDED PERITONEAL/RETROPERITONEAL FLUID DRAIN BY PERC CATH Result Date: 08/06/2023 CLINICAL DATA:  Status post small bowel resection for closed loop obstruction with small bowel ischemia and perforation on 07/26/2023. Development of postoperative leukocytosis and fluid collections primarily adjacent to the liver and in the lower posterior pelvis. EXAM: 1. CT GUIDED PERCUTANEOUS CATHETER DRAINAGE OF RIGHT ABDOMINAL PERITONEAL ABSCESS 2. CT-GUIDED PERCUTANEOUS CATHETER DRAINAGE OF PELVIC PERITONEAL ABSCESS  ANESTHESIA/SEDATION: Moderate (conscious) sedation was employed during this procedure. A total of Versed 2.5 mg and Fentanyl 100 mcg was administered intravenously. Moderate Sedation Time: 52 minutes. The patient's level of consciousness and vital signs were monitored continuously by radiology nursing throughout the procedure under my direct supervision. PROCEDURE: The procedure, risks, benefits, and alternatives were explained to the patient. Questions regarding the procedure were encouraged and answered. The patient understands and consents to the procedure. A time out was performed prior to initiating the procedure. Initial CT through the upper to mid abdomen was performed in a supine position. The right abdominal wall was prepped with chlorhexidine in a sterile fashion, and a sterile drape was applied covering the operative field. A sterile gown and sterile gloves were used for the procedure. Local anesthesia was provided with 1% Lidocaine. Under CT guidance, an 18 gauge trocar needle was advanced into a fluid collection along the inferior aspect of the liver. After confirming needle tip position, a guidewire was advanced. The tract was dilated over the guidewire and a 10 French percutaneous drainage catheter advanced. Final catheter position was confirmed by CT. A fluid sample was withdrawn and sent for culture analysis. The catheter was then connected to suction bulb drainage. It was secured at the skin exit site with a Prolene retention suture, adhesive StatLock device and overlying dressing. Imaging was performed in a left lateral decubitus position followed by a right lateral decubitus position with the left side up. From a left transgluteal approach, a new 18 gauge trocar needle was advanced into a deep pelvic fluid collection. Confirming needle tip position, a guidewire was advanced into the collection and wire position confirmed by CT. The percutaneous tract was dilated over the guidewire and a 10 French  percutaneous drainage catheter advanced over the wire. Final catheter position was confirmed by CT. A fluid sample was withdrawn from the drain and sent for culture analysis. The catheter was connected to suction bulb drainage. It was secured at the skin exit site with a Prolene retention suture, adhesive StatLock device and overlying dressing. RADIATION DOSE REDUCTION: This exam was performed according to the departmental dose-optimization program which includes automated exposure control, adjustment of the mA and/or kV according to patient size and/or use of iterative reconstruction technique. COMPLICATIONS: None FINDINGS: The fluid collection spanning along much of the anterolateral aspect of the liver was first addressed. From an anterior approach, the inferior aspect of this collection was punctured in a cranial direction such that the guidewire advanced superiorly towards the superior aspect of the collection. After drain placement, the drainage catheter extends up towards the superior aspect. There was return of purulent fluid. Initial decubitus imaging with the right side up was not optimal for approach to the posterior pelvic fluid collection. From the opposite decubitus position, a left transgluteal approach was chosen for access. The fluid collection yielded dark bloody fluid. IMPRESSION: 1. Right-sided perihepatic abscess yielded  purulent fluid. A 10 French drainage catheter was placed and attached to suction bulb drainage. A sample of purulent fluid from this abscess was sent for culture analysis. 2. Left transgluteal approach to the deep pelvic abscess yielded dark bloody fluid. A 10 French drainage catheter was placed and attached to suction bulb drainage. A sample of bloody fluid from this collection was sent for culture analysis. Electronically Signed   By: Irish Lack M.D.   On: 08/06/2023 15:40   CT GUIDED PERITONEAL/RETROPERITONEAL FLUID DRAIN BY PERC CATH Result Date: 08/06/2023 CLINICAL  DATA:  Status post small bowel resection for closed loop obstruction with small bowel ischemia and perforation on 07/26/2023. Development of postoperative leukocytosis and fluid collections primarily adjacent to the liver and in the lower posterior pelvis. EXAM: 1. CT GUIDED PERCUTANEOUS CATHETER DRAINAGE OF RIGHT ABDOMINAL PERITONEAL ABSCESS 2. CT-GUIDED PERCUTANEOUS CATHETER DRAINAGE OF PELVIC PERITONEAL ABSCESS ANESTHESIA/SEDATION: Moderate (conscious) sedation was employed during this procedure. A total of Versed 2.5 mg and Fentanyl 100 mcg was administered intravenously. Moderate Sedation Time: 52 minutes. The patient's level of consciousness and vital signs were monitored continuously by radiology nursing throughout the procedure under my direct supervision. PROCEDURE: The procedure, risks, benefits, and alternatives were explained to the patient. Questions regarding the procedure were encouraged and answered. The patient understands and consents to the procedure. A time out was performed prior to initiating the procedure. Initial CT through the upper to mid abdomen was performed in a supine position. The right abdominal wall was prepped with chlorhexidine in a sterile fashion, and a sterile drape was applied covering the operative field. A sterile gown and sterile gloves were used for the procedure. Local anesthesia was provided with 1% Lidocaine. Under CT guidance, an 18 gauge trocar needle was advanced into a fluid collection along the inferior aspect of the liver. After confirming needle tip position, a guidewire was advanced. The tract was dilated over the guidewire and a 10 French percutaneous drainage catheter advanced. Final catheter position was confirmed by CT. A fluid sample was withdrawn and sent for culture analysis. The catheter was then connected to suction bulb drainage. It was secured at the skin exit site with a Prolene retention suture, adhesive StatLock device and overlying dressing.  Imaging was performed in a left lateral decubitus position followed by a right lateral decubitus position with the left side up. From a left transgluteal approach, a new 18 gauge trocar needle was advanced into a deep pelvic fluid collection. Confirming needle tip position, a guidewire was advanced into the collection and wire position confirmed by CT. The percutaneous tract was dilated over the guidewire and a 10 French percutaneous drainage catheter advanced over the wire. Final catheter position was confirmed by CT. A fluid sample was withdrawn from the drain and sent for culture analysis. The catheter was connected to suction bulb drainage. It was secured at the skin exit site with a Prolene retention suture, adhesive StatLock device and overlying dressing. RADIATION DOSE REDUCTION: This exam was performed according to the departmental dose-optimization program which includes automated exposure control, adjustment of the mA and/or kV according to patient size and/or use of iterative reconstruction technique. COMPLICATIONS: None FINDINGS: The fluid collection spanning along much of the anterolateral aspect of the liver was first addressed. From an anterior approach, the inferior aspect of this collection was punctured in a cranial direction such that the guidewire advanced superiorly towards the superior aspect of the collection. After drain placement, the drainage catheter extends up towards the  superior aspect. There was return of purulent fluid. Initial decubitus imaging with the right side up was not optimal for approach to the posterior pelvic fluid collection. From the opposite decubitus position, a left transgluteal approach was chosen for access. The fluid collection yielded dark bloody fluid. IMPRESSION: 1. Right-sided perihepatic abscess yielded purulent fluid. A 10 French drainage catheter was placed and attached to suction bulb drainage. A sample of purulent fluid from this abscess was sent for  culture analysis. 2. Left transgluteal approach to the deep pelvic abscess yielded dark bloody fluid. A 10 French drainage catheter was placed and attached to suction bulb drainage. A sample of bloody fluid from this collection was sent for culture analysis. Electronically Signed   By: Irish Lack M.D.   On: 08/06/2023 15:40      Assessment/Plan:  INTERVAL HISTORY: sp IR guided drainage with 2 drains placed   Principal Problem:   Intra-abdominal abscess (HCC) Active Problems:   Small bowel obstruction (HCC)   Malnutrition of moderate degree    Laura Bradshaw is a 72 y.o. female with Mall bowel obstruction status post resection, complicated now by intra-abdominal abscesses  #1 Intrabdominal abscesses:  Sp IR guided drainage with 2 JP drains  GS with yeast  Micafungin added to her zosyn  Followup her culture data  #2 SBO sp surgery. Also on TPN via dual lumen PICC  I have personally spent 54 minutes involved in face-to-face and non-face-to-face activities for this patient on the day of the visit. Professional time spent includes the following activities: Preparing to see the patient (review of tests), Obtaining and/or reviewing separately obtained history (admission/discharge record), Performing a medically appropriate examination and/or evaluation , Ordering medications/tests/procedures, referring and communicating with other health care professionals, Documenting clinical information in the EMR, Independently interpreting results (not separately reported), Communicating results to the patient/family/caregiver, Counseling and educating the patient/family/caregiver and Care coordination (not separately reported).      LOS: 12 days   Acey Lav 08/07/2023, 7:30 PM

## 2023-08-07 NOTE — Progress Notes (Signed)
PROGRESS NOTE    DAREEN TENNENBAUM  ZOX:[id number] DOB: 1951-04-30 DOA: 07/26/2023 PCP: Alberteen Sam, FNP    Brief Narrative:  88 with history of HTN, anxiety, depression, COPD, HSV, low back pain, mild dementia comes to the hospital with acute abdominal pain and vomiting.  She was found to have closed-loop obstruction with ischemia.  Taken to the OR for laparoscopic which was eventually converted to open requiring small bowel resection due to perforation on 12/5.  Postop developed ileus requiring NG tube later repeat scan showed intra-abdominal abscess therefore IR consulted.  ID has been following and patient has been on broad-spectrum IV antibiotics.  Patient underwent CT-guided right abdominal/pelvic asp/drainage on 12/16.   Assessment & Plan:  Principal Problem:   Small bowel obstruction (HCC) Active Problems:   Malnutrition of moderate degree    Small bowel obstruction, perforation with ischemia requiring open resection Postop complicated by ileus and now possible abscess Initial surgery performed on 12/5.  Postop developed ileus requiring NG tube which has now been removed.  Repeat scan shows concerns of abscess therefore IR performed CT-guided drainage of the right abdominal/pelvic abscess.  Cultures have been sent.  Further tailor antibiotics as needed. -On empiric Zosyn - TPN, possibly advance diet as tolerated   Hypokalemia/hyponatremia/hypophosphatemia Repleted as needed  Essential hypertension On lisinopril.  IV as needed   Anxiety and Depression -Stable   COPD As needed bronchodilators   Mild Hypercalcemia, improved Resolved   Leukocytosis, worsening in the setting of Intraabdominal Abscesses  Slow improvement   Abnormal LFTs  -Elevated in setting of Abscess along the periphery of R Hepatic Lobe Trend LFTs   Hyperglycemia in the setting of Prediabetes -HbA1c 6.1%.  Sliding scale and Accu-Cheks  Normocytic Anemia Hemoglobin stable   Moderate  Protein Calorie Malnutrition Nutrition Status: Current on TPN, will advance as tolerated  Remove PICC once no longer needed   DVT prophylaxis: Lovenox Code Status: Full code Family Communication:  Family at bedside Status is: Inpatient Remains inpatient appropriate because: Ongoing complicated abd issues. Cont hosp stay    Subjective: Tells me pain is little better controlled this morning, no other complaints.   Examination:  General exam: Appears calm and comfortable  Respiratory system: Clear to auscultation. Respiratory effort normal. Cardiovascular system: S1 & S2 heard, RRR. No JVD, murmurs, rubs, gallops or clicks. No pedal edema. Gastrointestinal system: Abdomen is nondistended, soft and nontender. No organomegaly or masses felt. Normal bowel sounds heard. Central nervous system: Alert and oriented. No focal neurological deficits. Extremities: Symmetric 5 x 5 power. Skin: No rashes, lesions or ulcers Psychiatry: Judgement and insight appear normal. Mood & affect appropriate. Right upper extremity PICC line Right lower quadrant drain Left gluteal drain               Diet Orders (From admission, onward)     Start     Ordered   08/07/23 0905  Diet full liquid Fluid consistency: Thin  Diet effective now       Question:  Fluid consistency:  Answer:  Thin   08/07/23 0904            Objective: Vitals:   08/06/23 2204 08/07/23 0449 08/07/23 0827 08/07/23 1208  BP:  (!) 151/66  (!) 143/62  Pulse:  83  73  Resp:  16  17  Temp:  98 F (36.7 C)  98.6 F (37 C)  TempSrc:  Oral  Oral  SpO2: 97% 99% 97% 98%  Weight:  65.6 kg  Height:        Intake/Output Summary (Last 24 hours) at 08/07/2023 1230 Last data filed at 08/07/2023 1610 Gross per 24 hour  Intake 5395.2 ml  Output 165 ml  Net 5230.2 ml   Filed Weights   08/04/23 0505 08/06/23 0453 08/07/23 0449  Weight: 72.3 kg 65.1 kg 65.6 kg    Scheduled Meds:  acetaminophen  1,000 mg Oral Q8H    Chlorhexidine Gluconate Cloth  6 each Topical Daily   cyclobenzaprine  10 mg Oral QHS   enoxaparin (LOVENOX) injection  30 mg Subcutaneous Q24H   escitalopram  20 mg Oral Daily   feeding supplement  237 mL Oral TID WC   insulin aspart  0-15 Units Subcutaneous Q6H   lidocaine  2 patch Transdermal Q24H   lisinopril  10 mg Oral QHS   mometasone-formoterol  2 puff Inhalation BID   montelukast  10 mg Oral QHS   nicotine  21 mg Transdermal Daily   simvastatin  40 mg Oral Daily   sodium chloride flush  10-40 mL Intracatheter Q12H   sodium chloride flush  5 mL Intracatheter Q8H   thiamine (VITAMIN B1) injection  100 mg Intravenous Daily   Continuous Infusions:  TPN (CLINIMIX) Adult with Electrolyte Additives     And   fat emul(SMOFlipid)     micafungin (MYCAMINE) 150 mg in sodium chloride 0.9 % 100 mL IVPB Stopped (08/07/23 0218)   piperacillin-tazobactam (ZOSYN)  IV 12.5 mL/hr at 08/07/23 9604   sodium chloride 154 mEq/L, potassium chloride 20 mEq/L in dextrose 10 % 1,000 mL Pediatric IV infusion 60 mL/hr at 08/07/23 5409   TPN (CLINIMIX) Adult with Electrolyte Additives 41 mL/hr at 08/07/23 8119    Nutritional status Signs/Symptoms: mild fat depletion, mild muscle depletion, energy intake < 75% for > 7 days Interventions: TPN Body mass index is 27.33 kg/m.  Data Reviewed:   CBC: Recent Labs  Lab 08/02/23 1120 08/03/23 0335 08/04/23 0009 08/05/23 0253 08/06/23 0231 08/07/23 0227  WBC 22.8* 26.2* 26.5* 25.0* 24.8* 19.0*  NEUTROABS 17.6* 20.6* 21.5* 20.3* 19.8*  --   HGB 9.8* 9.6* 9.2* 9.3* 9.1* 8.9*  HCT 29.3* 28.6* 26.8* 29.1* 28.7* 28.3*  MCV 95.8 95.3 94.7 98.6 98.6 99.6  PLT 313 360 457* 552* 627* 689*   Basic Metabolic Panel: Recent Labs  Lab 08/02/23 0418 08/03/23 0335 08/04/23 0009 08/05/23 0253 08/06/23 0231 08/07/23 0227  NA 134* 129* 128* 130* 127* 134*  K 3.1* 3.4* 3.7 3.3* 3.4* 3.9  CL 102 100 97* 100 97* 104  CO2 24 22 24 23 24 23   GLUCOSE 124*  125* 125* 143* 125* 151*  BUN 16 19 17 16 16 17   CREATININE <0.30* 0.37* 0.53 0.36* 0.50 0.59  CALCIUM 9.6 9.7 9.4 9.5 9.2 9.4  MG 2.1 2.3 2.3 2.5* 2.4 2.2  PHOS 3.2 2.7 2.8 3.7 2.5  --    GFR: Estimated Creatinine Clearance: 54.3 mL/min (by C-G formula based on SCr of 0.59 mg/dL). Liver Function Tests: Recent Labs  Lab 08/03/23 0335 08/04/23 0009 08/05/23 0253 08/06/23 0231 08/07/23 0227  AST 73* 49* 49* 44* 41  ALT 82* 81* 85* 84* 85*  ALKPHOS 244* 230* 234* 247* 235*  BILITOT 1.0 0.6 0.5 0.5 0.5  PROT 6.1* 5.9* 5.9* 6.1* 6.1*  ALBUMIN 2.3* 2.2* 2.3* 2.3* 2.3*   No results for input(s): "LIPASE", "AMYLASE" in the last 168 hours. No results for input(s): "AMMONIA" in the last 168 hours. Coagulation Profile: Recent Labs  Lab 08/05/23 0253  INR 1.1   Cardiac Enzymes: No results for input(s): "CKTOTAL", "CKMB", "CKMBINDEX", "TROPONINI" in the last 168 hours. BNP (last 3 results) No results for input(s): "PROBNP" in the last 8760 hours. HbA1C: No results for input(s): "HGBA1C" in the last 72 hours. CBG: Recent Labs  Lab 08/06/23 1426 08/06/23 1751 08/06/23 2337 08/07/23 0452 08/07/23 1207  GLUCAP 121* 96 125* 151* 145*   Lipid Profile: Recent Labs    08/06/23 0231  TRIG 306*   Thyroid Function Tests: No results for input(s): "TSH", "T4TOTAL", "FREET4", "T3FREE", "THYROIDAB" in the last 72 hours. Anemia Panel: No results for input(s): "VITAMINB12", "FOLATE", "FERRITIN", "TIBC", "IRON", "RETICCTPCT" in the last 72 hours. Sepsis Labs: No results for input(s): "PROCALCITON", "LATICACIDVEN" in the last 168 hours.  Recent Results (from the past 240 hours)  Culture, blood (Routine X 2) w Reflex to ID Panel     Status: None   Collection Time: 08/02/23  4:08 PM   Specimen: BLOOD LEFT ARM  Result Value Ref Range Status   Specimen Description   Final    BLOOD LEFT ARM Performed at [facility], [address] [place], [place] [address]    Special  Requests   Final    BOTTLES DRAWN AEROBIC AND ANAEROBIC Blood Culture results may not be optimal due to an inadequate volume of blood received in culture bottles Performed at [facility], [address], [place], [place] [address]    Culture   Final    NO GROWTH 5 DAYS Performed at [facility], [address], [place], [place] [address]    Report Status 08/07/2023 FINAL  Final  Culture, blood (Routine X 2) w Reflex to ID Panel     Status: None   Collection Time: 08/02/23  4:16 PM   Specimen: BLOOD RIGHT HAND  Result Value Ref Range Status   Specimen Description   Final    BLOOD RIGHT HAND Performed at [facility], [address], [place], [place] [address]    Special Requests   Final    BOTTLES DRAWN AEROBIC AND ANAEROBIC Blood Culture results may not be optimal due to an inadequate volume of blood received in culture bottles Performed at [facility], [address] [place], [place] [address]    Culture   Final    NO GROWTH 5 DAYS Performed at [facility], [address], [place], [place] [address]    Report Status 08/07/2023 FINAL  Final  Aerobic/Anaerobic Culture w Gram Stain (surgical/deep wound)     Status: None (Preliminary result)   Collection Time: 08/06/23  1:49 PM   Specimen: Abscess  Result Value Ref Range Status   Specimen Description   Final    ABSCESS Performed at [facility], [address], [place], [place] [address]    Special Requests   Final    Normal Performed at [facility], [address], [place], [place] [address]    Gram Stain   Final    ABUNDANT WBC PRESENT, PREDOMINANTLY PMN RARE BUDDING YEAST SEEN    Culture   Final    NO GROWTH < 24 HOURS Performed at [facility], [address] [place], [place] [address]    Report Status PENDING  Incomplete  Aerobic/Anaerobic Culture w Gram Stain (surgical/deep wound)     Status: None (Preliminary  result)   Collection Time: 08/06/23  1:50 PM  Specimen: Abscess  Result Value Ref Range Status   Specimen Description   Final    ABSCESS Performed at [facility], [address] [place], [place] [address]    Special Requests   Final    Normal Performed at [facility], [address], [place], [place] [address]    Gram Stain   Final    FEW WBC PRESENT,BOTH PMN AND MONONUCLEAR NO ORGANISMS SEEN    Culture   Final    NO GROWTH < 24 HOURS Performed at [facility], [address] [place], [place] [address]    Report Status PENDING  Incomplete         Radiology Studies: CT GUIDED PERITONEAL/RETROPERITONEAL FLUID DRAIN BY PERC CATH Result Date: 08/06/2023 CLINICAL DATA:  Status post small bowel resection for closed loop obstruction with small bowel ischemia and perforation on 07/26/2023. Development of postoperative leukocytosis and fluid collections primarily adjacent to the liver and in the lower posterior pelvis. EXAM: 1. CT GUIDED PERCUTANEOUS CATHETER DRAINAGE OF RIGHT ABDOMINAL PERITONEAL ABSCESS 2. CT-GUIDED PERCUTANEOUS CATHETER DRAINAGE OF PELVIC PERITONEAL ABSCESS ANESTHESIA/SEDATION: Moderate (conscious) sedation was employed during this procedure. A total of Versed 2.5 mg and Fentanyl 100 mcg was administered intravenously. Moderate Sedation Time: 52 minutes. The patient's level of consciousness and vital signs were monitored continuously by radiology nursing throughout the procedure under my direct supervision. PROCEDURE: The procedure, risks, benefits, and alternatives were explained to the patient. Questions regarding the procedure were encouraged and answered. The patient understands and consents to the procedure. A time out was performed prior to initiating the procedure. Initial CT through the upper to mid abdomen was performed in a supine position. The right abdominal wall was prepped with chlorhexidine in a sterile fashion,  and a sterile drape was applied covering the operative field. A sterile gown and sterile gloves were used for the procedure. Local anesthesia was provided with 1% Lidocaine. Under CT guidance, an 18 gauge trocar needle was advanced into a fluid collection along the inferior aspect of the liver. After confirming needle tip position, a guidewire was advanced. The tract was dilated over the guidewire and a 10 French percutaneous drainage catheter advanced. Final catheter position was confirmed by CT. A fluid sample was withdrawn and sent for culture analysis. The catheter was then connected to suction bulb drainage. It was secured at the skin exit site with a Prolene retention suture, adhesive StatLock device and overlying dressing. Imaging was performed in a left lateral decubitus position followed by a right lateral decubitus position with the left side up. From a left transgluteal approach, a new 18 gauge trocar needle was advanced into a deep pelvic fluid collection. Confirming needle tip position, a guidewire was advanced into the collection and wire position confirmed by CT. The percutaneous tract was dilated over the guidewire and a 10 French percutaneous drainage catheter advanced over the wire. Final catheter position was confirmed by CT. A fluid sample was withdrawn from the drain and sent for culture analysis. The catheter was connected to suction bulb drainage. It was secured at the skin exit site with a Prolene retention suture, adhesive StatLock device and overlying dressing. RADIATION DOSE REDUCTION: This exam was performed according to the departmental dose-optimization program which includes automated exposure control, adjustment of the mA and/or kV according to patient size and/or use of iterative reconstruction technique. COMPLICATIONS: None FINDINGS: The fluid collection spanning along much of the anterolateral aspect of the liver was first addressed.  From an anterior approach, the inferior aspect  of this collection was punctured in a cranial direction such that the guidewire advanced superiorly towards the superior aspect of the collection. After drain placement, the drainage catheter extends up towards the superior aspect. There was return of purulent fluid. Initial decubitus imaging with the right side up was not optimal for approach to the posterior pelvic fluid collection. From the opposite decubitus position, a left transgluteal approach was chosen for access. The fluid collection yielded dark bloody fluid. IMPRESSION: 1. Right-sided perihepatic abscess yielded purulent fluid. A 10 French drainage catheter was placed and attached to suction bulb drainage. A sample of purulent fluid from this abscess was sent for culture analysis. 2. Left transgluteal approach to the deep pelvic abscess yielded dark bloody fluid. A 10 French drainage catheter was placed and attached to suction bulb drainage. A sample of bloody fluid from this collection was sent for culture analysis. Electronically Signed   By: Irish Lack M.D.   On: 08/06/2023 15:40   CT GUIDED PERITONEAL/RETROPERITONEAL FLUID DRAIN BY PERC CATH Result Date: 08/06/2023 CLINICAL DATA:  Status post small bowel resection for closed loop obstruction with small bowel ischemia and perforation on 07/26/2023. Development of postoperative leukocytosis and fluid collections primarily adjacent to the liver and in the lower posterior pelvis. EXAM: 1. CT GUIDED PERCUTANEOUS CATHETER DRAINAGE OF RIGHT ABDOMINAL PERITONEAL ABSCESS 2. CT-GUIDED PERCUTANEOUS CATHETER DRAINAGE OF PELVIC PERITONEAL ABSCESS ANESTHESIA/SEDATION: Moderate (conscious) sedation was employed during this procedure. A total of Versed 2.5 mg and Fentanyl 100 mcg was administered intravenously. Moderate Sedation Time: 52 minutes. The patient's level of consciousness and vital signs were monitored continuously by radiology nursing throughout the procedure under my direct supervision.  PROCEDURE: The procedure, risks, benefits, and alternatives were explained to the patient. Questions regarding the procedure were encouraged and answered. The patient understands and consents to the procedure. A time out was performed prior to initiating the procedure. Initial CT through the upper to mid abdomen was performed in a supine position. The right abdominal wall was prepped with chlorhexidine in a sterile fashion, and a sterile drape was applied covering the operative field. A sterile gown and sterile gloves were used for the procedure. Local anesthesia was provided with 1% Lidocaine. Under CT guidance, an 18 gauge trocar needle was advanced into a fluid collection along the inferior aspect of the liver. After confirming needle tip position, a guidewire was advanced. The tract was dilated over the guidewire and a 10 French percutaneous drainage catheter advanced. Final catheter position was confirmed by CT. A fluid sample was withdrawn and sent for culture analysis. The catheter was then connected to suction bulb drainage. It was secured at the skin exit site with a Prolene retention suture, adhesive StatLock device and overlying dressing. Imaging was performed in a left lateral decubitus position followed by a right lateral decubitus position with the left side up. From a left transgluteal approach, a new 18 gauge trocar needle was advanced into a deep pelvic fluid collection. Confirming needle tip position, a guidewire was advanced into the collection and wire position confirmed by CT. The percutaneous tract was dilated over the guidewire and a 10 French percutaneous drainage catheter advanced over the wire. Final catheter position was confirmed by CT. A fluid sample was withdrawn from the drain and sent for culture analysis. The catheter was connected to suction bulb drainage. It was secured at the skin exit site with a Prolene retention suture, adhesive StatLock device and overlying dressing.  RADIATION  DOSE REDUCTION: This exam was performed according to the departmental dose-optimization program which includes automated exposure control, adjustment of the mA and/or kV according to patient size and/or use of iterative reconstruction technique. COMPLICATIONS: None FINDINGS: The fluid collection spanning along much of the anterolateral aspect of the liver was first addressed. From an anterior approach, the inferior aspect of this collection was punctured in a cranial direction such that the guidewire advanced superiorly towards the superior aspect of the collection. After drain placement, the drainage catheter extends up towards the superior aspect. There was return of purulent fluid. Initial decubitus imaging with the right side up was not optimal for approach to the posterior pelvic fluid collection. From the opposite decubitus position, a left transgluteal approach was chosen for access. The fluid collection yielded dark bloody fluid. IMPRESSION: 1. Right-sided perihepatic abscess yielded purulent fluid. A 10 French drainage catheter was placed and attached to suction bulb drainage. A sample of purulent fluid from this abscess was sent for culture analysis. 2. Left transgluteal approach to the deep pelvic abscess yielded dark bloody fluid. A 10 French drainage catheter was placed and attached to suction bulb drainage. A sample of bloody fluid from this collection was sent for culture analysis. Electronically Signed   By: Irish Lack M.D.   On: 08/06/2023 15:40           LOS: 12 days   Time spent= 35 mins    Miguel Rota, MD [facility]  If 7PM-7AM, please contact night-coverage  08/07/2023, 12:30 PM

## 2023-08-08 DIAGNOSIS — K56609 Unspecified intestinal obstruction, unspecified as to partial versus complete obstruction: Secondary | ICD-10-CM | POA: Diagnosis not present

## 2023-08-08 DIAGNOSIS — E440 Moderate protein-calorie malnutrition: Secondary | ICD-10-CM | POA: Diagnosis not present

## 2023-08-08 DIAGNOSIS — K651 Peritoneal abscess: Secondary | ICD-10-CM | POA: Diagnosis not present

## 2023-08-08 DIAGNOSIS — T8143XA Infection following a procedure, organ and space surgical site, initial encounter: Secondary | ICD-10-CM | POA: Diagnosis not present

## 2023-08-08 LAB — COMPREHENSIVE METABOLIC PANEL
ALT: 81 U/L — ABNORMAL HIGH (ref 0–44)
AST: 36 U/L (ref 15–41)
Albumin: 2.2 g/dL — ABNORMAL LOW (ref 3.5–5.0)
Alkaline Phosphatase: 211 U/L — ABNORMAL HIGH (ref 38–126)
Anion gap: 6 (ref 5–15)
BUN: 17 mg/dL (ref 8–23)
CO2: 23 mmol/L (ref 22–32)
Calcium: 9.3 mg/dL (ref 8.9–10.3)
Chloride: 103 mmol/L (ref 98–111)
Creatinine, Ser: 0.47 mg/dL (ref 0.44–1.00)
GFR, Estimated: >60 mL/min (ref >60)
Glucose, Bld: 142 mg/dL — ABNORMAL HIGH (ref 70–99)
Potassium: 3.8 mmol/L (ref 3.5–5.1)
Sodium: 132 mmol/L — ABNORMAL LOW (ref 135–145)
Total Bilirubin: 0.6 mg/dL (ref <1.2)
Total Protein: 6.3 g/dL — ABNORMAL LOW (ref 6.5–8.1)

## 2023-08-08 LAB — GLUCOSE, CAPILLARY
Glucose-Capillary: 147 mg/dL — ABNORMAL HIGH (ref 70–99)
Glucose-Capillary: 154 mg/dL — ABNORMAL HIGH (ref 70–99)
Glucose-Capillary: 115 mg/dL — ABNORMAL HIGH (ref 70–99)
Glucose-Capillary: 127 mg/dL — ABNORMAL HIGH (ref 70–99)

## 2023-08-08 LAB — CBC
HCT: 27.2 % — ABNORMAL LOW (ref 36.0–46.0)
Hemoglobin: 8.6 g/dL — ABNORMAL LOW (ref 12.0–15.0)
MCH: 31.6 pg (ref 26.0–34.0)
MCHC: 31.6 g/dL (ref 30.0–36.0)
MCV: 100 fL (ref 80.0–100.0)
Platelets: 746 10*3/uL — ABNORMAL HIGH (ref 150–400)
RBC: 2.72 MIL/uL — ABNORMAL LOW (ref 3.87–5.11)
RDW: 14 % (ref 11.5–15.5)
WBC: 17 10*3/uL — ABNORMAL HIGH (ref 4.0–10.5)
nRBC: 0 % (ref 0.0–0.2)

## 2023-08-08 LAB — MAGNESIUM: Magnesium: 2 mg/dL (ref 1.7–2.4)

## 2023-08-08 MED ORDER — DEXTROSE-SODIUM CHLORIDE 5-0.45 % IV SOLN: INTRAVENOUS | Status: DC

## 2023-08-08 MED ORDER — MORPHINE SULFATE (PF) 2 MG/ML IV SOLN: 1.0000 mg | INTRAVENOUS | Status: DC | PRN

## 2023-08-08 MED ORDER — OXYCODONE HCL 5 MG PO TABS
5.0000 mg | ORAL_TABLET | ORAL | Status: DC | PRN
  Administered 2023-08-08 – 2023-08-09 (×2): 5 mg via ORAL
  Filled 2023-08-08 (×2): qty 1

## 2023-08-08 MED ORDER — INSULIN ASPART 100 UNIT/ML IJ SOLN
0.0000 [IU] | Freq: Three times a day (TID) | INTRAMUSCULAR | Status: AC
  Administered 2023-08-08: 2 [IU] via SUBCUTANEOUS

## 2023-08-08 NOTE — Progress Notes (Signed)
Occupational Therapy Treatment Patient Details Name: Laura Bradshaw MRN: [id number] DOB: 10-14-1950 Today's Date: 08/08/2023   History of present illness Pt is 72 yo female admitted on 07/26/23 with SBO, perforation, jejunal ischemia and is s/p open small bowel resection on 07/26/23.  Midline wound open - possible wound vac placement Monday, 12/9.  Pt with hx including but not limited to HTN, anxiety, depression, COPD, HSV, low back pain, mild dementia.   OT comments  Pt making good progress toward goals and appears to be more motivated this afternoon to mobilize. Pt overall min assist with most mobility in the room and adl transfers and min assist to occasional mod assist for LE adls. Pt continues to be very weak and fatigues quickly.  Family very supportive.       If plan is discharge home, recommend the following:  A little help with walking and/or transfers;A little help with bathing/dressing/bathroom;Help with stairs or ramp for entrance;Assist for transportation;Assistance with cooking/housework   Equipment Recommendations  None recommended by OT    Recommendations for Other Services      Precautions / Restrictions Precautions Precautions: Fall Precaution Comments: abdominal wound vac, 2 bulb drains Restrictions Weight Bearing Restrictions Per Provider Order: No       Mobility Bed Mobility Overal bed mobility: Needs Assistance Bed Mobility: Rolling, Sidelying to Sit Rolling: Supervision Sidelying to sit: Min assist, Used rails       General bed mobility comments: cues to use bedrails instead of pulling up on therapist.    Transfers Overall transfer level: Needs assistance Equipment used: Rolling walker (2 wheels) Transfers: Bed to chair/wheelchair/BSC, Sit to/from Stand Sit to Stand: Contact guard assist     Step pivot transfers: Contact guard assist     General transfer comment: cues for hand placement     Balance Overall balance assessment: Needs  assistance Sitting-balance support: No upper extremity supported Sitting balance-Leahy Scale: Good     Standing balance support: Bilateral upper extremity supported Standing balance-Leahy Scale: Poor Standing balance comment: RW and CGA                           ADL either performed or assessed with clinical judgement   ADL Overall ADL's : Needs assistance/impaired Eating/Feeding: Set up;Sitting   Grooming: Wash/dry hands;Wash/dry face;Supervision/safety;Standing Grooming Details (indicate cue type and reason): at sink Upper Body Bathing: Set up;Sitting   Lower Body Bathing: Minimal assistance;Sit to/from stand;Cueing for compensatory techniques   Upper Body Dressing : Minimal assistance;Sitting Upper Body Dressing Details (indicate cue type and reason): assist due to drains and lines Lower Body Dressing: Minimal assistance;Sit to/from stand;Cueing for compensatory techniques Lower Body Dressing Details (indicate cue type and reason): pt doing better with figure 4position.  Holding off on adaptive equipment feeling pt will not need it and helping to do figure 4 for her hip mobility. Toilet Transfer: Contact guard assist;BSC/3in1   Toileting- Clothing Manipulation and Hygiene: Minimal assistance;Sit to/from stand;Cueing for safety       Functional mobility during ADLs: Minimal assistance;Rolling walker (2 wheels) General ADL Comments: Pt much more steady on feet today during adls and pt more motivated to participate.    Extremity/Trunk Assessment Upper Extremity Assessment Upper Extremity Assessment: Overall WFL for tasks assessed   Lower Extremity Assessment Lower Extremity Assessment: Defer to PT evaluation        Vision   Vision Assessment?: No apparent visual deficits   Perception Perception Perception: Within Functional Limits  Praxis Praxis Praxis: WFL    Cognition Arousal: Alert Behavior During Therapy: WFL for tasks assessed/performed Overall  Cognitive Status: Within Functional Limits for tasks assessed                                 General Comments: AxO x 3 very sweet Lady.  Nervous.  Feeling poorly.  Daughter and Son in [place] present.  Very supportive.        Exercises      Shoulder Instructions       General Comments Pt much more steady on feet this session    Pertinent Vitals/ Pain       Pain Assessment Pain Assessment: 0-10 Pain Score: 2  Pain Location: L hip Pain Descriptors / Indicators: Grimacing, Guarding Pain Intervention(s): Limited activity within patient's tolerance, Monitored during session, Repositioned  Home Living                                          Prior Functioning/Environment              Frequency  Min 1X/week        Progress Toward Goals  OT Goals(current goals can now be found in the care plan section)  Progress towards OT goals: Progressing toward goals  Acute Rehab OT Goals Patient Stated Goal: to get stronger to go home OT Goal Formulation: With patient/family Time For Goal Achievement: 08/16/23 Potential to Achieve Goals: Good ADL Goals Pt Will Perform Lower Body Bathing: sit to/from stand;with adaptive equipment;with supervision Pt Will Perform Lower Body Dressing: with supervision;sitting/lateral leans;sit to/from stand;with adaptive equipment Pt Will Transfer to Toilet: with supervision;ambulating Additional ADL Goal #1: Pt to verbalize at least 3 energy conservation strategies to implement in home environment  Plan      Co-evaluation                 AM-PAC OT "6 Clicks" Daily Activity     Outcome Measure   Help from another person eating meals?: A Little Help from another person taking care of personal grooming?: A Little Help from another person toileting, which includes using toliet, bedpan, or urinal?: A Little Help from another person bathing (including washing, rinsing, drying)?: A Little Help from another  person to put on and taking off regular upper body clothing?: A Little Help from another person to put on and taking off regular lower body clothing?: A Little 6 Click Score: 18    End of Session Equipment Utilized During Treatment: Rolling walker (2 wheels)  OT Visit Diagnosis: Other abnormalities of gait and mobility (R26.89);Muscle weakness (generalized) (M62.81)   Activity Tolerance Patient tolerated treatment well   Patient Left in chair;with call bell/phone within reach;with family/visitor present   Nurse Communication Mobility status        Time: 1250-1316 OT Time Calculation (min): 26 min  Charges: OT Treatments $Self Care/Home Management : 23-37 mins   Meike, Brasington 08/08/2023, 1:38 PM

## 2023-08-08 NOTE — Progress Notes (Signed)
Subjective:  No new complaints   Antibiotics:  Anti-infectives (From admission, onward)    Start     Dose/Rate Route Frequency Ordered Stop   08/08/23 0000  micafungin (MYCAMINE) 100 mg in sodium chloride 0.9 % 100 mL IVPB        100 mg 105 mL/hr over 1 Hours Intravenous Every 24 hours 08/07/23 1549     08/07/23 0000  micafungin (MYCAMINE) 150 mg in sodium chloride 0.9 % 100 mL IVPB  Status:  Discontinued        150 mg 107.5 mL/hr over 1 Hours Intravenous Every 24 hours 08/06/23 2301 08/07/23 1549   07/28/23 1330  piperacillin-tazobactam (ZOSYN) IVPB 3.375 g        3.375 g 12.5 mL/hr over 240 Minutes Intravenous Every 8 hours 07/28/23 1232     07/26/23 1745  piperacillin-tazobactam (ZOSYN) IVPB 3.375 g        3.375 g 12.5 mL/hr over 240 Minutes Intravenous  Once 07/26/23 1737 07/26/23 1931       Medications: Scheduled Meds:  acetaminophen  1,000 mg Oral Q8H   Chlorhexidine Gluconate Cloth  6 each Topical Daily   cyclobenzaprine  10 mg Oral QHS   enoxaparin (LOVENOX) injection  30 mg Subcutaneous Q24H   escitalopram  20 mg Oral Daily   feeding supplement  237 mL Oral TID WC   Gerhardt's butt cream   Topical BID   insulin aspart  0-15 Units Subcutaneous Q8H   lidocaine  2 patch Transdermal Q24H   lisinopril  10 mg Oral QHS   mometasone-formoterol  2 puff Inhalation BID   montelukast  10 mg Oral QHS   nicotine  21 mg Transdermal Daily   simvastatin  40 mg Oral Daily   sodium chloride flush  10-40 mL Intracatheter Q12H   sodium chloride flush  5 mL Intracatheter Q8H   Continuous Infusions:  [START ON 08/09/2023] dextrose 5 % and 0.45 % NaCl     micafungin (MYCAMINE) 100 mg in sodium chloride 0.9 % 100 mL IVPB Stopped (08/08/23 0106)   piperacillin-tazobactam (ZOSYN)  IV 3.375 g (08/08/23 1423)   sodium chloride 154 mEq/L, potassium chloride 20 mEq/L in dextrose 10 % 1,000 mL Pediatric IV infusion 40 mL/hr at 08/08/23 1042   TPN (CLINIMIX) Adult with  Electrolyte Additives 41 mL/hr at 08/07/23 1740   PRN Meds:.albuterol, fluticasone, guaiFENesin-dextromethorphan, hydrALAZINE, ketorolac, LORazepam, methocarbamol (ROBAXIN) injection, metoprolol tartrate, morphine injection, ondansetron **OR** ondansetron (ZOFRAN) IV, oxyCODONE, simethicone, sodium chloride flush    Objective: Weight change:   Intake/Output Summary (Last 24 hours) at 08/08/2023 1648 Last data filed at 08/08/2023 1500 Gross per 24 hour  Intake 3163.43 ml  Output 70 ml  Net 3093.43 ml   Blood pressure (!) 135/59, pulse 84, temperature 97.9 F (36.6 C), temperature source Oral, resp. rate 18, height 5\' 1"  (1.549 m), weight 65.6 kg, SpO2 100%. Temp:  [97.8 F (36.6 C)-98 F (36.7 C)] 97.9 F (36.6 C) (12/18 1253) Pulse Rate:  [76-84] 84 (12/18 1253) Resp:  [16-18] 18 (12/18 1253) BP: (135-160)/(59-70) 135/59 (12/18 1253) SpO2:  [98 %-100 %] 100 % (12/18 1253)  Physical Exam: Physical Exam Constitutional:      General: She is not in acute distress.    Appearance: She is well-developed. She is not diaphoretic.  HENT:     Head: Normocephalic and atraumatic.     Right Ear: External ear normal.     Left Ear: External ear normal.  Mouth/Throat:     Pharynx: No oropharyngeal exudate.  Eyes:     General: No scleral icterus.    Conjunctiva/sclera: Conjunctivae normal.     Pupils: Pupils are equal, round, and reactive to light.  Cardiovascular:     Rate and Rhythm: Normal rate and regular rhythm.     Heart sounds: Normal heart sounds. No murmur heard.    No friction rub. No gallop.  Pulmonary:     Effort: Pulmonary effort is normal. No respiratory distress.     Breath sounds: No wheezing.  Abdominal:     Palpations: Abdomen is soft.  Musculoskeletal:        General: No tenderness. Normal range of motion.  Lymphadenopathy:     Cervical: No cervical adenopathy.  Skin:    General: Skin is warm and dry.     Coloration: Skin is not pale.     Findings: No  erythema or rash.  Neurological:     General: No focal deficit present.     Mental Status: She is alert and oriented to person, place, and time.     Motor: No abnormal muscle tone.     Coordination: Coordination normal.  Psychiatric:        Mood and Affect: Mood normal.        Behavior: Behavior normal.        Thought Content: Thought content normal.        Judgment: Judgment normal.     Frank pus though less than yesterday in JP drain on the right, left one with less material  CBC:    BMET Recent Labs    08/07/23 0227 08/08/23 0346  NA 134* 132*  K 3.9 3.8  CL 104 103  CO2 23 23  GLUCOSE 151* 142*  BUN 17 17  CREATININE 0.59 0.47  CALCIUM 9.4 9.3     Liver Panel  Recent Labs    08/07/23 0227 08/08/23 0346  PROT 6.1* 6.3*  ALBUMIN 2.3* 2.2*  AST 41 36  ALT 85* 81*  ALKPHOS 235* 211*  BILITOT 0.5 0.6       Sedimentation Rate No results for input(s): "ESRSEDRATE" in the last 72 hours. C-Reactive Protein No results for input(s): "CRP" in the last 72 hours.  Micro Results: Recent Results (from the past 720 hours)  Blood culture (routine x 2)     Status: None   Collection Time: 07/26/23  9:16 PM   Specimen: BLOOD  Result Value Ref Range Status   Specimen Description   Final    BLOOD BLOOD RIGHT HAND Performed at [facility], [address] [place], [place] [address]    Special Requests   Final    BOTTLES DRAWN AEROBIC AND ANAEROBIC Blood Culture results may not be optimal due to an inadequate volume of blood received in culture bottles Performed at [facility], [address] [place], [place] [address]    Culture   Final    NO GROWTH 5 DAYS Performed at [facility], [address], [place], [place] [address]    Report Status 07/31/2023 FINAL  Final  Blood culture (routine x 2)     Status: None   Collection Time: 07/26/23  9:16 PM   Specimen: BLOOD  Result Value Ref Range Status   Specimen  Description   Final    BLOOD RIGHT ANTECUBITAL Performed at [facility], [address], [place], [place] [address]    Special Requests  Final    BOTTLES DRAWN AEROBIC AND ANAEROBIC Blood Culture adequate volume Performed at [facility], [address] [place], [place] [address]    Culture   Final    NO GROWTH 5 DAYS Performed at [facility], [address] [place], [place] [address]    Report Status 07/31/2023 FINAL  Final  Culture, blood (Routine X 2) w Reflex to ID Panel     Status: None   Collection Time: 08/02/23  4:08 PM   Specimen: BLOOD LEFT ARM  Result Value Ref Range Status   Specimen Description   Final    BLOOD LEFT ARM Performed at [facility], [address], [place], [place] [address]    Special Requests   Final    BOTTLES DRAWN AEROBIC AND ANAEROBIC Blood Culture results may not be optimal due to an inadequate volume of blood received in culture bottles Performed at [facility], [address] [place], [place] [address]    Culture   Final    NO GROWTH 5 DAYS Performed at [facility], [address], [place], [place] [address]    Report Status 08/07/2023 FINAL  Final  Culture, blood (Routine X 2) w Reflex to ID Panel     Status: None   Collection Time: 08/02/23  4:16 PM   Specimen: BLOOD RIGHT HAND  Result Value Ref Range Status   Specimen Description   Final    BLOOD RIGHT HAND Performed at [facility], [address] [place], [place] [address]    Special Requests   Final    BOTTLES DRAWN AEROBIC AND ANAEROBIC Blood Culture results may not be optimal due to an inadequate volume of blood received in culture bottles Performed at [facility], [address] [place], [place] [address]    Culture   Final    NO GROWTH 5 DAYS Performed at [facility], [address], [place], [place] [address]    Report Status 08/07/2023 FINAL  Final   Aerobic/Anaerobic Culture w Gram Stain (surgical/deep wound)     Status: None (Preliminary result)   Collection Time: 08/06/23  1:49 PM   Specimen: Abscess  Result Value Ref Range Status   Specimen Description   Final    ABSCESS Performed at [facility], [address], [place], [place] [address]    Special Requests   Final    Normal Performed at [facility], [address] [place], [place] [address]    Gram Stain   Final    ABUNDANT WBC PRESENT, PREDOMINANTLY PMN RARE BUDDING YEAST SEEN Performed at [facility], [address] [place], [place] [address]    Culture   Final    CULTURE REINCUBATED FOR BETTER GROWTH NO ANAEROBES ISOLATED; CULTURE IN PROGRESS FOR 5 DAYS    Report Status PENDING  Incomplete  Aerobic/Anaerobic Culture w Gram Stain (surgical/deep wound)     Status: None (Preliminary result)   Collection Time: 08/06/23  1:50 PM   Specimen: Abscess  Result Value Ref Range Status   Specimen Description   Final    ABSCESS Performed at [facility], [address] [place], [place] [address]    Special Requests   Final    Normal Performed at [facility], [address], [place], [place] [address]    Gram Stain   Final    FEW WBC PRESENT,BOTH  PMN AND MONONUCLEAR NO ORGANISMS SEEN    Culture   Final    NO GROWTH 2 DAYS NO ANAEROBES ISOLATED; CULTURE IN PROGRESS FOR 5 DAYS Performed at [facility], [address] [place], [place] [address]    Report Status PENDING  Incomplete    Studies/Results: No results found.     Assessment/Plan:  INTERVAL HISTORY:cultures without growth yet  Principal Problem:   Intra-abdominal abscess (HCC) Active Problems:   Small bowel obstruction (HCC)   Malnutrition of moderate degree    Laura Bradshaw is a 72 y.o. female with Mall bowel obstruction status post resection, complicated now by intra-abdominal abscesses  #1  Intrabdominal abscesses:  Sp IR guided drainage with 2 JP drains  GS with yeast though no organism has grown yet.  Micafungin added to her zosyn  Followup her culture data  #2 SBO sp surgery. Also on TPN via dual lumen PICC  I have personally spent 38 minutes involved in face-to-face and non-face-to-face activities for this patient on the day of the visit. Professional time spent includes the following activities: Preparing to see the patient (review of tests), Obtaining and/or reviewing separately obtained history (admission/discharge record), Performing a medically appropriate examination and/or evaluation , Ordering medications/tests/procedures, referring and communicating with other health care professionals, Documenting clinical information in the EMR, Independently interpreting results (not separately reported), Communicating results to the patient/family/caregiver, Counseling and educating the patient/family/caregiver and Care coordination (not separately reported).      LOS: 13 days   Acey Lav 08/08/2023, 4:48 PM

## 2023-08-08 NOTE — Plan of Care (Signed)
  Problem: Clinical Measurements: Goal: Respiratory complications will improve Outcome: Progressing   Problem: Nutrition: Goal: Adequate nutrition will be maintained Outcome: Progressing   Problem: Safety: Goal: Ability to remain free from injury will improve Outcome: Progressing   Problem: Tissue Perfusion: Goal: Adequacy of tissue perfusion will improve Outcome: Progressing

## 2023-08-08 NOTE — Progress Notes (Signed)
13 Days Post-Op   Subjective/Chief Complaint: NAEO. Tolerating FLD. Walked in the hall yesterday and sat up in the chair. +flatus. Last Bm was yseterday  Objective: Vital signs in last 24 hours: Temp:  [97.8 F (36.6 C)-98.6 F (37 C)] 98 F (36.7 C) (12/18 0531) Pulse Rate:  [73-78] 78 (12/18 0531) Resp:  [16-17] 16 (12/18 0531) BP: (143-160)/(62-70) 159/67 (12/18 0531) SpO2:  [97 %-100 %] 100 % (12/18 0531) Last BM Date : 08/06/23  Intake/Output from previous day: 12/17 0701 - 12/18 0700 In: 2333.9 [I.V.:2091.3; IV Piggyback:242.6] Out: 70 [Drains:70] Intake/Output this shift: No intake/output data recorded.  Alert, NAD Resp: slightly labored on nasal cannula  Abd: soft, appropraitely tender, no peritonitis, vac in place holding suction, laparoscopic incisions c/d/I without cellulitis. Right IR drain w/ purulent fluid (50 mL), transgluteal drain with minimal, cloudy SS fluid (20 mL)   Lab Results:  Recent Labs    08/07/23 0227 08/08/23 0346  WBC 19.0* 17.0*  HGB 8.9* 8.6*  HCT 28.3* 27.2*  PLT 689* 746*   BMET Recent Labs    08/07/23 0227 08/08/23 0346  NA 134* 132*  K 3.9 3.8  CL 104 103  CO2 23 23  GLUCOSE 151* 142*  BUN 17 17  CREATININE 0.59 0.47  CALCIUM 9.4 9.3   PT/INR No results for input(s): "LABPROT", "INR" in the last 72 hours.  ABG No results for input(s): "PHART", "HCO3" in the last 72 hours.  Invalid input(s): "PCO2", "PO2"  Studies/Results: CT GUIDED PERITONEAL/RETROPERITONEAL FLUID DRAIN BY PERC CATH Result Date: 08/06/2023 CLINICAL DATA:  Status post small bowel resection for closed loop obstruction with small bowel ischemia and perforation on 07/26/2023. Development of postoperative leukocytosis and fluid collections primarily adjacent to the liver and in the lower posterior pelvis. EXAM: 1. CT GUIDED PERCUTANEOUS CATHETER DRAINAGE OF RIGHT ABDOMINAL PERITONEAL ABSCESS 2. CT-GUIDED PERCUTANEOUS CATHETER DRAINAGE OF PELVIC PERITONEAL  ABSCESS ANESTHESIA/SEDATION: Moderate (conscious) sedation was employed during this procedure. A total of Versed 2.5 mg and Fentanyl 100 mcg was administered intravenously. Moderate Sedation Time: 52 minutes. The patient's level of consciousness and vital signs were monitored continuously by radiology nursing throughout the procedure under my direct supervision. PROCEDURE: The procedure, risks, benefits, and alternatives were explained to the patient. Questions regarding the procedure were encouraged and answered. The patient understands and consents to the procedure. A time out was performed prior to initiating the procedure. Initial CT through the upper to mid abdomen was performed in a supine position. The right abdominal wall was prepped with chlorhexidine in a sterile fashion, and a sterile drape was applied covering the operative field. A sterile gown and sterile gloves were used for the procedure. Local anesthesia was provided with 1% Lidocaine. Under CT guidance, an 18 gauge trocar needle was advanced into a fluid collection along the inferior aspect of the liver. After confirming needle tip position, a guidewire was advanced. The tract was dilated over the guidewire and a 10 French percutaneous drainage catheter advanced. Final catheter position was confirmed by CT. A fluid sample was withdrawn and sent for culture analysis. The catheter was then connected to suction bulb drainage. It was secured at the skin exit site with a Prolene retention suture, adhesive StatLock device and overlying dressing. Imaging was performed in a left lateral decubitus position followed by a right lateral decubitus position with the left side up. From a left transgluteal approach, a new 18 gauge trocar needle was advanced into a deep pelvic fluid collection. Confirming needle  tip position, a guidewire was advanced into the collection and wire position confirmed by CT. The percutaneous tract was dilated over the guidewire and a  10 French percutaneous drainage catheter advanced over the wire. Final catheter position was confirmed by CT. A fluid sample was withdrawn from the drain and sent for culture analysis. The catheter was connected to suction bulb drainage. It was secured at the skin exit site with a Prolene retention suture, adhesive StatLock device and overlying dressing. RADIATION DOSE REDUCTION: This exam was performed according to the departmental dose-optimization program which includes automated exposure control, adjustment of the mA and/or kV according to patient size and/or use of iterative reconstruction technique. COMPLICATIONS: None FINDINGS: The fluid collection spanning along much of the anterolateral aspect of the liver was first addressed. From an anterior approach, the inferior aspect of this collection was punctured in a cranial direction such that the guidewire advanced superiorly towards the superior aspect of the collection. After drain placement, the drainage catheter extends up towards the superior aspect. There was return of purulent fluid. Initial decubitus imaging with the right side up was not optimal for approach to the posterior pelvic fluid collection. From the opposite decubitus position, a left transgluteal approach was chosen for access. The fluid collection yielded dark bloody fluid. IMPRESSION: 1. Right-sided perihepatic abscess yielded purulent fluid. A 10 French drainage catheter was placed and attached to suction bulb drainage. A sample of purulent fluid from this abscess was sent for culture analysis. 2. Left transgluteal approach to the deep pelvic abscess yielded dark bloody fluid. A 10 French drainage catheter was placed and attached to suction bulb drainage. A sample of bloody fluid from this collection was sent for culture analysis. Electronically Signed   By: Irish Lack M.D.   On: 08/06/2023 15:40   CT GUIDED PERITONEAL/RETROPERITONEAL FLUID DRAIN BY PERC CATH Result Date:  08/06/2023 CLINICAL DATA:  Status post small bowel resection for closed loop obstruction with small bowel ischemia and perforation on 07/26/2023. Development of postoperative leukocytosis and fluid collections primarily adjacent to the liver and in the lower posterior pelvis. EXAM: 1. CT GUIDED PERCUTANEOUS CATHETER DRAINAGE OF RIGHT ABDOMINAL PERITONEAL ABSCESS 2. CT-GUIDED PERCUTANEOUS CATHETER DRAINAGE OF PELVIC PERITONEAL ABSCESS ANESTHESIA/SEDATION: Moderate (conscious) sedation was employed during this procedure. A total of Versed 2.5 mg and Fentanyl 100 mcg was administered intravenously. Moderate Sedation Time: 52 minutes. The patient's level of consciousness and vital signs were monitored continuously by radiology nursing throughout the procedure under my direct supervision. PROCEDURE: The procedure, risks, benefits, and alternatives were explained to the patient. Questions regarding the procedure were encouraged and answered. The patient understands and consents to the procedure. A time out was performed prior to initiating the procedure. Initial CT through the upper to mid abdomen was performed in a supine position. The right abdominal wall was prepped with chlorhexidine in a sterile fashion, and a sterile drape was applied covering the operative field. A sterile gown and sterile gloves were used for the procedure. Local anesthesia was provided with 1% Lidocaine. Under CT guidance, an 18 gauge trocar needle was advanced into a fluid collection along the inferior aspect of the liver. After confirming needle tip position, a guidewire was advanced. The tract was dilated over the guidewire and a 10 French percutaneous drainage catheter advanced. Final catheter position was confirmed by CT. A fluid sample was withdrawn and sent for culture analysis. The catheter was then connected to suction bulb drainage. It was secured at the skin exit site  with a Prolene retention suture, adhesive StatLock device and  overlying dressing. Imaging was performed in a left lateral decubitus position followed by a right lateral decubitus position with the left side up. From a left transgluteal approach, a new 18 gauge trocar needle was advanced into a deep pelvic fluid collection. Confirming needle tip position, a guidewire was advanced into the collection and wire position confirmed by CT. The percutaneous tract was dilated over the guidewire and a 10 French percutaneous drainage catheter advanced over the wire. Final catheter position was confirmed by CT. A fluid sample was withdrawn from the drain and sent for culture analysis. The catheter was connected to suction bulb drainage. It was secured at the skin exit site with a Prolene retention suture, adhesive StatLock device and overlying dressing. RADIATION DOSE REDUCTION: This exam was performed according to the departmental dose-optimization program which includes automated exposure control, adjustment of the mA and/or kV according to patient size and/or use of iterative reconstruction technique. COMPLICATIONS: None FINDINGS: The fluid collection spanning along much of the anterolateral aspect of the liver was first addressed. From an anterior approach, the inferior aspect of this collection was punctured in a cranial direction such that the guidewire advanced superiorly towards the superior aspect of the collection. After drain placement, the drainage catheter extends up towards the superior aspect. There was return of purulent fluid. Initial decubitus imaging with the right side up was not optimal for approach to the posterior pelvic fluid collection. From the opposite decubitus position, a left transgluteal approach was chosen for access. The fluid collection yielded dark bloody fluid. IMPRESSION: 1. Right-sided perihepatic abscess yielded purulent fluid. A 10 French drainage catheter was placed and attached to suction bulb drainage. A sample of purulent fluid from this abscess  was sent for culture analysis. 2. Left transgluteal approach to the deep pelvic abscess yielded dark bloody fluid. A 10 French drainage catheter was placed and attached to suction bulb drainage. A sample of bloody fluid from this collection was sent for culture analysis. Electronically Signed   By: Irish Lack M.D.   On: 08/06/2023 15:40     Anti-infectives: Anti-infectives (From admission, onward)    Start     Dose/Rate Route Frequency Ordered Stop   08/08/23 0000  micafungin (MYCAMINE) 100 mg in sodium chloride 0.9 % 100 mL IVPB        100 mg 105 mL/hr over 1 Hours Intravenous Every 24 hours 08/07/23 1549     08/07/23 0000  micafungin (MYCAMINE) 150 mg in sodium chloride 0.9 % 100 mL IVPB  Status:  Discontinued        150 mg 107.5 mL/hr over 1 Hours Intravenous Every 24 hours 08/06/23 2301 08/07/23 1549   07/28/23 1330  piperacillin-tazobactam (ZOSYN) IVPB 3.375 g        3.375 g 12.5 mL/hr over 240 Minutes Intravenous Every 8 hours 07/28/23 1232     07/26/23 1745  piperacillin-tazobactam (ZOSYN) IVPB 3.375 g        3.375 g 12.5 mL/hr over 240 Minutes Intravenous  Once 07/26/23 1737 07/26/23 1931       Assessment/Plan: Closed loop SBO with ischemic small bowel and perforation  POD 12 s/p laparoscopy converted to open small bowel resection Dr. Maisie Fus - post-op ileus seems to be resolving - s/p IR drain x2 on 12/16; follow Cx, abx per ID - advance to soft diet and ensure, wean TPN.  - VAC M/W/F - continue to mobilize as tolerated - continue tylenol, toradol,  lidoderm for pain    Severe protein calorie malnutrition - prealbumin 5 12/10, now tolerated a diet. Wean TPN. Encourage protein shakes.   FEN: Soft  VTE: LMWH ID: Zosyn 12/5>>, micafungin 12/17 >> started by ID for budding yeast on cultures   Adam Phenix 08/08/2023

## 2023-08-08 NOTE — Progress Notes (Addendum)
PHARMACY - TOTAL PARENTERAL NUTRITION CONSULT NOTE   Indication: Prolonged ileus  Patient Measurements: Height: 5\' 1"  (154.9 cm) Weight: 65.6 kg (144 lb 10 oz) IBW/kg (Calculated) : 47.8 TPN AdjBW (KG): 52.7 Body mass index is 27.33 kg/m. Usual Weight:   Assessment:  Pharmacy is consulted to start TPN on 72 yo female with a prolonged ileus. Pt was taken emergently to OR on 12/5 after CT was concerning for closed loop bowel obstruction with ischemia.  Small bowel obstruction, perforation, jejunal ischemia s/p Resection and now with Ileus which is improved complicated by Intraabdominal Abscesses now   Glucose / Insulin:  - HbA1c 6.1%. prediabetic  - 9 units of insulin on SSI given in past 24 hours Electrolytes:  Lytes wnl exc Na slightly low Renal:  SCr and BUN wnl and stable Hepatic:  - Alk phos mildly elevated and improving; other LFTs stable WNL; albumin remains low/stable Intake / Output; MIVF:  - UOP/PO intake uncharted, LBM 12/16 - On D10-NS + 20K at 55ml/hr  GI Imaging: 12/13 Abdominal xray: Multiple fluid collections concerning for abscesses. Possible post op ileus and enteritis.  GI Surgeries / Procedures:  - 12/5 SBO and perforated closed loop bowel obstruction with ischemic jejunum   Central access: 12/10 TPN start date: 12/10   Nutritional Goals:   Clinimix E 8/10 1000 mL daily will provide 80 grams of protein at 1164 Kcal with Lipids. D10 at 60 ml/hr will provide 490 kcal for a total of 1654 kcal. This will meet both the protein and calorie goals   Due to the Baxter IV fluid disruption, all adult parenteral nutrition will utilize premade Clinimix products +/- fat emulsion infusion. Our goal will be to continue providing as close to 100% of our patient's nutritional needs, but due to limited TPN Clinimix concentrations we will meet at least 80% of protein and 75% of kcal goals.   RD Assessment: Estimated Needs Total Energy Estimated Needs: 1600-1800 Total  Protein Estimated Needs: 80-90g Total Fluid Estimated Needs: 1.8L/day  Current Nutrition:  NPO  Plan: Per discussion with Surgery team, will let Clinimix 8/10 stop at 1800 today; already at 40 ml/hr and contains relatively low dextrose, so no taper-down needed For now will continue D10 + NS + K 20 mEq and reduce rate to 40 ml/hr. At 0600 tomorrow will transition to D5-1/2 NS at 50 ml/hr. In this way will provide an effective ramp-down for the dextrose in IVF as Clinimix is stopped this evening Can stop SSI/CBG monitoring once TPN off, given excellent control on full-rate TPN (can still monitor via chem panels) Pharmacy will sign off  Bernadene Person, PharmD, BCPS [phone] 08/08/2023, 9:51 AM

## 2023-08-08 NOTE — Progress Notes (Signed)
PROGRESS NOTE    Laura Bradshaw  GHW:[id number] DOB: [birth year] DOA: 07/26/2023 PCP: Alberteen Sam, FNP    Brief Narrative:  1 with history of HTN, anxiety, depression, COPD, HSV, low back pain, mild dementia comes to the hospital with acute abdominal pain and vomiting.  She was found to have closed-loop obstruction with ischemia.  Taken to the OR for laparoscopic which was eventually converted to open requiring small bowel resection due to perforation on 12/5.  Postop developed ileus requiring NG tube later repeat scan showed intra-abdominal abscess therefore IR consulted.  ID has been following and patient has been on broad-spectrum IV antibiotics.  Patient underwent CT-guided right abdominal/pelvic asp/drainage on 12/16.  Currently on TPN, slowly plan to wean this off as her p.o. intake improves.   Assessment & Plan:  Principal Problem:   Small bowel obstruction (HCC) Active Problems:   Malnutrition of moderate degree    Small bowel obstruction, perforation with ischemia requiring open resection Postop complicated by ileus and now possible abscess Initial surgery performed on 12/5.  Postop developed ileus requiring NG tube which has now been removed.  Repeat scan shows concerns of abscess therefore IR performed CT-guided drainage of the right abdominal/pelvic abscess.  Follow cultures, tailor antibiotics.  Advised out of bed to chair -On empiric Zosyn, ID following - TPN, possibly advance diet as tolerated   Hypokalemia/hyponatremia/hypophosphatemia Repleted as needed  Essential hypertension On lisinopril.  IV as needed   Anxiety and Depression -Stable   COPD As needed bronchodilators   Mild Hypercalcemia, improved Resolved   Leukocytosis, worsening in the setting of Intraabdominal Abscesses  Slow improvement   Abnormal LFTs  -Elevated in setting of Abscess along the periphery of R Hepatic Lobe Trend LFTs   Hyperglycemia in the setting of Prediabetes -HbA1c  6.1%.  Sliding scale and Accu-Cheks  Normocytic Anemia Hemoglobin stable   Moderate Protein Calorie Malnutrition Nutrition Status: Current on TPN, will advance as tolerated. Slowly wean off TPN  Remove PICC once no longer needed   DVT prophylaxis: Lovenox Code Status: Full code Family Communication:  Family at bedside Status is: Inpatient Remains inpatient appropriate because: Ongoing complicated abd issues. Cont hosp stay    Subjective: Seen at bedside, no complaints.  Tells me she was able to get out of bed to chair yesterday and ambulate some in the hallway.  Pain is better controlled as well.  Examination:  General exam: Appears calm and comfortable  Respiratory system: Clear to auscultation. Respiratory effort normal. Cardiovascular system: S1 & S2 heard, RRR. No JVD, murmurs, rubs, gallops or clicks. No pedal edema. Gastrointestinal system: Abdomen is nondistended, soft and nontender. No organomegaly or masses felt. Normal bowel sounds heard. Central nervous system: Alert and oriented. No focal neurological deficits. Extremities: Symmetric 5 x 5 power. Skin: No rashes, lesions or ulcers Psychiatry: Judgement and insight appear normal. Mood & affect appropriate. Right upper extremity PICC line Right lower quadrant drain Left gluteal drain               Diet Orders (From admission, onward)     Start     Ordered   08/08/23 0806  DIET SOFT Room service appropriate? Yes; Fluid consistency: Thin  Diet effective now       Question Answer Comment  Room service appropriate? Yes   Fluid consistency: Thin      08/08/23 0805            Objective: Vitals:   08/07/23 2014 08/07/23 2036 08/08/23  0531 08/08/23 0844  BP: (!) 160/70  (!) 159/67   Pulse: 76  78   Resp: 16  16   Temp: 97.8 F (36.6 C)  98 F (36.7 C)   TempSrc:      SpO2: 100% 98% 100% 100%  Weight:      Height:        Intake/Output Summary (Last 24 hours) at 08/08/2023 1229 Last  data filed at 08/08/2023 0600 Gross per 24 hour  Intake 2333.89 ml  Output 70 ml  Net 2263.89 ml   Filed Weights   08/04/23 0505 08/06/23 0453 08/07/23 0449  Weight: 72.3 kg 65.1 kg 65.6 kg    Scheduled Meds:  acetaminophen  1,000 mg Oral Q8H   Chlorhexidine Gluconate Cloth  6 each Topical Daily   cyclobenzaprine  10 mg Oral QHS   enoxaparin (LOVENOX) injection  30 mg Subcutaneous Q24H   escitalopram  20 mg Oral Daily   feeding supplement  237 mL Oral TID WC   Gerhardt's butt cream   Topical BID   insulin aspart  0-15 Units Subcutaneous Q8H   lidocaine  2 patch Transdermal Q24H   lisinopril  10 mg Oral QHS   mometasone-formoterol  2 puff Inhalation BID   montelukast  10 mg Oral QHS   nicotine  21 mg Transdermal Daily   simvastatin  40 mg Oral Daily   sodium chloride flush  10-40 mL Intracatheter Q12H   sodium chloride flush  5 mL Intracatheter Q8H   Continuous Infusions:  [START ON 08/09/2023] dextrose 5 % and 0.45 % NaCl     micafungin (MYCAMINE) 100 mg in sodium chloride 0.9 % 100 mL IVPB Stopped (08/08/23 0106)   piperacillin-tazobactam (ZOSYN)  IV 3.375 g (08/08/23 0556)   sodium chloride 154 mEq/L, potassium chloride 20 mEq/L in dextrose 10 % 1,000 mL Pediatric IV infusion 40 mL/hr at 08/08/23 1042   TPN (CLINIMIX) Adult with Electrolyte Additives 41 mL/hr at 08/07/23 1740    Nutritional status Signs/Symptoms: mild fat depletion, mild muscle depletion, energy intake < 75% for > 7 days Interventions: TPN Body mass index is 27.33 kg/m.  Data Reviewed:   CBC: Recent Labs  Lab 08/02/23 1120 08/03/23 0335 08/04/23 0009 08/05/23 0253 08/06/23 0231 08/07/23 0227 08/08/23 0346  WBC 22.8* 26.2* 26.5* 25.0* 24.8* 19.0* 17.0*  NEUTROABS 17.6* 20.6* 21.5* 20.3* 19.8*  --   --   HGB 9.8* 9.6* 9.2* 9.3* 9.1* 8.9* 8.6*  HCT 29.3* 28.6* 26.8* 29.1* 28.7* 28.3* 27.2*  MCV 95.8 95.3 94.7 98.6 98.6 99.6 100.0  PLT 313 360 457* 552* 627* 689* 746*   Basic Metabolic  Panel: Recent Labs  Lab 08/02/23 0418 08/03/23 0335 08/04/23 0009 08/05/23 0253 08/06/23 0231 08/07/23 0227 08/08/23 0346  NA 134* 129* 128* 130* 127* 134* 132*  K 3.1* 3.4* 3.7 3.3* 3.4* 3.9 3.8  CL 102 100 97* 100 97* 104 103  CO2 24 22 24 23 24 23 23   GLUCOSE 124* 125* 125* 143* 125* 151* 142*  BUN 16 19 17 16 16 17 17   CREATININE <0.30* 0.37* 0.53 0.36* 0.50 0.59 0.47  CALCIUM 9.6 9.7 9.4 9.5 9.2 9.4 9.3  MG 2.1 2.3 2.3 2.5* 2.4 2.2 2.0  PHOS 3.2 2.7 2.8 3.7 2.5  --   --    GFR: Estimated Creatinine Clearance: 54.3 mL/min (by C-G formula based on SCr of 0.47 mg/dL). Liver Function Tests: Recent Labs  Lab 08/04/23 0009 08/05/23 7829 08/06/23 0231 08/07/23 0227 08/08/23 0346  AST 49* 49* 44* 41 36  ALT 81* 85* 84* 85* 81*  ALKPHOS 230* 234* 247* 235* 211*  BILITOT 0.6 0.5 0.5 0.5 0.6  PROT 5.9* 5.9* 6.1* 6.1* 6.3*  ALBUMIN 2.2* 2.3* 2.3* 2.3* 2.2*   No results for input(s): "LIPASE", "AMYLASE" in the last 168 hours. No results for input(s): "AMMONIA" in the last 168 hours. Coagulation Profile: Recent Labs  Lab 08/05/23 0253  INR 1.1   Cardiac Enzymes: No results for input(s): "CKTOTAL", "CKMB", "CKMBINDEX", "TROPONINI" in the last 168 hours. BNP (last 3 results) No results for input(s): "PROBNP" in the last 8760 hours. HbA1C: No results for input(s): "HGBA1C" in the last 72 hours. CBG: Recent Labs  Lab 08/07/23 1207 08/07/23 1753 08/07/23 2351 08/08/23 0529 08/08/23 1129  GLUCAP 145* 135* 152* 147* 154*   Lipid Profile: Recent Labs    08/06/23 0231  TRIG 306*   Thyroid Function Tests: No results for input(s): "TSH", "T4TOTAL", "FREET4", "T3FREE", "THYROIDAB" in the last 72 hours. Anemia Panel: No results for input(s): "VITAMINB12", "FOLATE", "FERRITIN", "TIBC", "IRON", "RETICCTPCT" in the last 72 hours. Sepsis Labs: No results for input(s): "PROCALCITON", "LATICACIDVEN" in the last 168 hours.  Recent Results (from the past 240 hours)   Culture, blood (Routine X 2) w Reflex to ID Panel     Status: None   Collection Time: 08/02/23  4:08 PM   Specimen: BLOOD LEFT ARM  Result Value Ref Range Status   Specimen Description   Final    BLOOD LEFT ARM Performed at [facility], [address], [place], [place] [address]    Special Requests   Final    BOTTLES DRAWN AEROBIC AND ANAEROBIC Blood Culture results may not be optimal due to an inadequate volume of blood received in culture bottles Performed at [facility], [address] [place], [place] [address]    Culture   Final    NO GROWTH 5 DAYS Performed at [facility], [address], [place], [place] [address]    Report Status 08/07/2023 FINAL  Final  Culture, blood (Routine X 2) w Reflex to ID Panel     Status: None   Collection Time: 08/02/23  4:16 PM   Specimen: BLOOD RIGHT HAND  Result Value Ref Range Status   Specimen Description   Final    BLOOD RIGHT HAND Performed at [facility], [address] [place], [place] [address]    Special Requests   Final    BOTTLES DRAWN AEROBIC AND ANAEROBIC Blood Culture results may not be optimal due to an inadequate volume of blood received in culture bottles Performed at [facility], [address], [place], [place] [address]    Culture   Final    NO GROWTH 5 DAYS Performed at [facility], [address] [place], [place] [address]    Report Status 08/07/2023 FINAL  Final  Aerobic/Anaerobic Culture w Gram Stain (surgical/deep wound)     Status: None (Preliminary result)   Collection Time: 08/06/23  1:49 PM   Specimen: Abscess  Result Value Ref Range Status   Specimen Description   Final    ABSCESS Performed at [facility], [address], [place], [place] [address]    Special Requests   Final    Normal Performed at [facility], [address] [place], [place] [address]    Gram Stain   Final    ABUNDANT WBC  PRESENT, PREDOMINANTLY PMN RARE BUDDING YEAST SEEN Performed at [facility], [address] [place], [place] [address]    Culture   Final    CULTURE REINCUBATED FOR BETTER GROWTH NO ANAEROBES ISOLATED; CULTURE IN PROGRESS FOR 5 DAYS    Report Status PENDING  Incomplete  Aerobic/Anaerobic Culture w Gram Stain (surgical/deep wound)     Status: None (Preliminary result)   Collection Time: 08/06/23  1:50 PM   Specimen: Abscess  Result Value Ref Range Status   Specimen Description   Final    ABSCESS Performed at [facility], [address], [place], [place] [address]    Special Requests   Final    Normal Performed at [facility], [address] [place], [place] [address]    Gram Stain   Final    FEW WBC PRESENT,BOTH PMN AND MONONUCLEAR NO ORGANISMS SEEN    Culture   Final    NO GROWTH 2 DAYS NO ANAEROBES ISOLATED; CULTURE IN PROGRESS FOR 5 DAYS Performed at [facility], [address] [place], [place] [address]    Report Status PENDING  Incomplete         Radiology Studies: CT GUIDED PERITONEAL/RETROPERITONEAL FLUID DRAIN BY PERC CATH Result Date: 08/06/2023 CLINICAL DATA:  Status post small bowel resection for closed loop obstruction with small bowel ischemia and perforation on 07/26/2023. Development of postoperative leukocytosis and fluid collections primarily adjacent to the liver and in the lower posterior pelvis. EXAM: 1. CT GUIDED PERCUTANEOUS CATHETER DRAINAGE OF RIGHT ABDOMINAL PERITONEAL ABSCESS 2. CT-GUIDED PERCUTANEOUS CATHETER DRAINAGE OF PELVIC PERITONEAL ABSCESS ANESTHESIA/SEDATION: Moderate (conscious) sedation was employed during this procedure. A total of Versed 2.5 mg and Fentanyl 100 mcg was administered intravenously. Moderate Sedation Time: 52 minutes. The patient's level of consciousness and vital signs were monitored continuously by radiology nursing throughout the procedure under my direct supervision.  PROCEDURE: The procedure, risks, benefits, and alternatives were explained to the patient. Questions regarding the procedure were encouraged and answered. The patient understands and consents to the procedure. A time out was performed prior to initiating the procedure. Initial CT through the upper to mid abdomen was performed in a supine position. The right abdominal wall was prepped with chlorhexidine in a sterile fashion, and a sterile drape was applied covering the operative field. A sterile gown and sterile gloves were used for the procedure. Local anesthesia was provided with 1% Lidocaine. Under CT guidance, an 18 gauge trocar needle was advanced into a fluid collection along the inferior aspect of the liver. After confirming needle tip position, a guidewire was advanced. The tract was dilated over the guidewire and a 10 French percutaneous drainage catheter advanced. Final catheter position was confirmed by CT. A fluid sample was withdrawn and sent for culture analysis. The catheter was then connected to suction bulb drainage. It was secured at the skin exit site with a Prolene retention suture, adhesive StatLock device and overlying dressing. Imaging was performed in a left lateral decubitus position followed by a right lateral decubitus position with the left side up. From a left transgluteal approach, a new 18 gauge trocar needle was advanced into a deep pelvic fluid collection. Confirming needle tip position, a guidewire was advanced into the collection and wire position confirmed by CT. The percutaneous tract was dilated over the guidewire and a 10 French percutaneous drainage catheter advanced over the wire. Final catheter position was confirmed by CT. A fluid sample was withdrawn from the  drain and sent for culture analysis. The catheter was connected to suction bulb drainage. It was secured at the skin exit site with a Prolene retention suture, adhesive StatLock device and overlying dressing. RADIATION  DOSE REDUCTION: This exam was performed according to the departmental dose-optimization program which includes automated exposure control, adjustment of the mA and/or kV according to patient size and/or use of iterative reconstruction technique. COMPLICATIONS: None FINDINGS: The fluid collection spanning along much of the anterolateral aspect of the liver was first addressed. From an anterior approach, the inferior aspect of this collection was punctured in a cranial direction such that the guidewire advanced superiorly towards the superior aspect of the collection. After drain placement, the drainage catheter extends up towards the superior aspect. There was return of purulent fluid. Initial decubitus imaging with the right side up was not optimal for approach to the posterior pelvic fluid collection. From the opposite decubitus position, a left transgluteal approach was chosen for access. The fluid collection yielded dark bloody fluid. IMPRESSION: 1. Right-sided perihepatic abscess yielded purulent fluid. A 10 French drainage catheter was placed and attached to suction bulb drainage. A sample of purulent fluid from this abscess was sent for culture analysis. 2. Left transgluteal approach to the deep pelvic abscess yielded dark bloody fluid. A 10 French drainage catheter was placed and attached to suction bulb drainage. A sample of bloody fluid from this collection was sent for culture analysis. Electronically Signed   By: Irish Lack M.D.   On: 08/06/2023 15:40   CT GUIDED PERITONEAL/RETROPERITONEAL FLUID DRAIN BY PERC CATH Result Date: 08/06/2023 CLINICAL DATA:  Status post small bowel resection for closed loop obstruction with small bowel ischemia and perforation on 07/26/2023. Development of postoperative leukocytosis and fluid collections primarily adjacent to the liver and in the lower posterior pelvis. EXAM: 1. CT GUIDED PERCUTANEOUS CATHETER DRAINAGE OF RIGHT ABDOMINAL PERITONEAL ABSCESS 2.  CT-GUIDED PERCUTANEOUS CATHETER DRAINAGE OF PELVIC PERITONEAL ABSCESS ANESTHESIA/SEDATION: Moderate (conscious) sedation was employed during this procedure. A total of Versed 2.5 mg and Fentanyl 100 mcg was administered intravenously. Moderate Sedation Time: 52 minutes. The patient's level of consciousness and vital signs were monitored continuously by radiology nursing throughout the procedure under my direct supervision. PROCEDURE: The procedure, risks, benefits, and alternatives were explained to the patient. Questions regarding the procedure were encouraged and answered. The patient understands and consents to the procedure. A time out was performed prior to initiating the procedure. Initial CT through the upper to mid abdomen was performed in a supine position. The right abdominal wall was prepped with chlorhexidine in a sterile fashion, and a sterile drape was applied covering the operative field. A sterile gown and sterile gloves were used for the procedure. Local anesthesia was provided with 1% Lidocaine. Under CT guidance, an 18 gauge trocar needle was advanced into a fluid collection along the inferior aspect of the liver. After confirming needle tip position, a guidewire was advanced. The tract was dilated over the guidewire and a 10 French percutaneous drainage catheter advanced. Final catheter position was confirmed by CT. A fluid sample was withdrawn and sent for culture analysis. The catheter was then connected to suction bulb drainage. It was secured at the skin exit site with a Prolene retention suture, adhesive StatLock device and overlying dressing. Imaging was performed in a left lateral decubitus position followed by a right lateral decubitus position with the left side up. From a left transgluteal approach, a new 18 gauge trocar needle was advanced into a deep  pelvic fluid collection. Confirming needle tip position, a guidewire was advanced into the collection and wire position confirmed by CT.  The percutaneous tract was dilated over the guidewire and a 10 French percutaneous drainage catheter advanced over the wire. Final catheter position was confirmed by CT. A fluid sample was withdrawn from the drain and sent for culture analysis. The catheter was connected to suction bulb drainage. It was secured at the skin exit site with a Prolene retention suture, adhesive StatLock device and overlying dressing. RADIATION DOSE REDUCTION: This exam was performed according to the departmental dose-optimization program which includes automated exposure control, adjustment of the mA and/or kV according to patient size and/or use of iterative reconstruction technique. COMPLICATIONS: None FINDINGS: The fluid collection spanning along much of the anterolateral aspect of the liver was first addressed. From an anterior approach, the inferior aspect of this collection was punctured in a cranial direction such that the guidewire advanced superiorly towards the superior aspect of the collection. After drain placement, the drainage catheter extends up towards the superior aspect. There was return of purulent fluid. Initial decubitus imaging with the right side up was not optimal for approach to the posterior pelvic fluid collection. From the opposite decubitus position, a left transgluteal approach was chosen for access. The fluid collection yielded dark bloody fluid. IMPRESSION: 1. Right-sided perihepatic abscess yielded purulent fluid. A 10 French drainage catheter was placed and attached to suction bulb drainage. A sample of purulent fluid from this abscess was sent for culture analysis. 2. Left transgluteal approach to the deep pelvic abscess yielded dark bloody fluid. A 10 French drainage catheter was placed and attached to suction bulb drainage. A sample of bloody fluid from this collection was sent for culture analysis. Electronically Signed   By: Irish Lack M.D.   On: 08/06/2023 15:40           LOS: 13  days   Time spent= 35 mins    Miguel Rota, MD [facility]  If 7PM-7AM, please contact night-coverage  08/08/2023, 12:29 PM

## 2023-08-09 DIAGNOSIS — D72829 Elevated white blood cell count, unspecified: Secondary | ICD-10-CM | POA: Diagnosis not present

## 2023-08-09 DIAGNOSIS — E440 Moderate protein-calorie malnutrition: Secondary | ICD-10-CM | POA: Diagnosis not present

## 2023-08-09 DIAGNOSIS — K56609 Unspecified intestinal obstruction, unspecified as to partial versus complete obstruction: Secondary | ICD-10-CM | POA: Diagnosis not present

## 2023-08-09 DIAGNOSIS — E876 Hypokalemia: Secondary | ICD-10-CM | POA: Diagnosis not present

## 2023-08-09 LAB — CBC
HCT: 26.7 % — ABNORMAL LOW (ref 36.0–46.0)
Hemoglobin: 8.6 g/dL — ABNORMAL LOW (ref 12.0–15.0)
MCH: 31.5 pg (ref 26.0–34.0)
MCHC: 32.2 g/dL (ref 30.0–36.0)
MCV: 97.8 fL (ref 80.0–100.0)
Platelets: 828 10*3/uL — ABNORMAL HIGH (ref 150–400)
RBC: 2.73 MIL/uL — ABNORMAL LOW (ref 3.87–5.11)
RDW: 14.1 % (ref 11.5–15.5)
WBC: 17.9 10*3/uL — ABNORMAL HIGH (ref 4.0–10.5)
nRBC: 0 % (ref 0.0–0.2)

## 2023-08-09 LAB — COMPREHENSIVE METABOLIC PANEL
ALT: 74 U/L — ABNORMAL HIGH (ref 0–44)
AST: 34 U/L (ref 15–41)
Albumin: 2.2 g/dL — ABNORMAL LOW (ref 3.5–5.0)
Alkaline Phosphatase: 202 U/L — ABNORMAL HIGH (ref 38–126)
Anion gap: 9 (ref 5–15)
BUN: 15 mg/dL (ref 8–23)
CO2: 24 mmol/L (ref 22–32)
Calcium: 9.5 mg/dL (ref 8.9–10.3)
Chloride: 100 mmol/L (ref 98–111)
Creatinine, Ser: 0.56 mg/dL (ref 0.44–1.00)
GFR, Estimated: >60 mL/min (ref >60)
Glucose, Bld: 98 mg/dL (ref 70–99)
Potassium: 3.9 mmol/L (ref 3.5–5.1)
Sodium: 133 mmol/L — ABNORMAL LOW (ref 135–145)
Total Bilirubin: 0.4 mg/dL (ref <1.2)
Total Protein: 6 g/dL — ABNORMAL LOW (ref 6.5–8.1)

## 2023-08-09 LAB — MAGNESIUM: Magnesium: 2 mg/dL (ref 1.7–2.4)

## 2023-08-09 LAB — GLUCOSE, CAPILLARY
Glucose-Capillary: 103 mg/dL — ABNORMAL HIGH (ref 70–99)
Glucose-Capillary: 106 mg/dL — ABNORMAL HIGH (ref 70–99)
Glucose-Capillary: 136 mg/dL — ABNORMAL HIGH (ref 70–99)

## 2023-08-09 NOTE — Progress Notes (Signed)
        Date: 08/09/2023  Patient name: Laura Bradshaw  Medical record number: [id number]  Date of birth: [birth year]   Patient growing Candida albicans from abscess culture Hopefully it is fluconazole sensitive but in the interim we will continue with the micafungin along with Zosyn  Acey Lav 08/09/2023, 11:41 AM

## 2023-08-09 NOTE — Plan of Care (Signed)

## 2023-08-09 NOTE — Progress Notes (Signed)
PROGRESS NOTE    Laura Bradshaw  NWG:[id number] DOB: [birth year] DOA: 07/26/2023 PCP: Alberteen Sam, FNP   Brief Narrative:  The patient is a 85 with history of HTN, anxiety, depression, COPD, HSV, low back pain, mild dementia comes to the hospital with acute abdominal pain and vomiting.  She was found to have closed-loop obstruction with ischemia.  Taken to the OR for laparoscopic which was eventually converted to open requiring small bowel resection due to perforation on 07/26/23 by Dr. Maisie Fus.   Postop developed ileus requiring NG tube later repeat scan showed intra-abdominal abscess therefore IR consulted.  ID has been following and patient has been on broad-spectrum IV antibiotics.  Patient underwent CT-guided right abdominal/pelvic asp/drainage on 08/06/23.  Her p.o. intake continues to improve and TPN has been weaned off.  Her left pelvic/transgluteal drain is removed today and cultures are also growing rare Candida albicans so ID added micafungin for the abscess culture but are hoping that is fluconazole sensitive.  Continue IV Zosyn and micafungin for now.  She did desaturate on home ambulatory O2 screen today and PT OT recommending discharging back home when she is medically stable.  Assessment & Plan:  Principal Problem:   Small bowel obstruction (HCC) Active Problems:   Malnutrition of moderate degree   Small bowel obstruction, perforation with ischemia requiring open resection Postop complicated by ileus and now possible abscess -Initial surgery performed on 12/5.  Postop developed ileus requiring NG tube which has now been removed.   -Repeat scan shows concerns of abscess therefore IR performed CT-guided drainage of the right abdominal/pelvic abscess.   -WBC Trend: Recent Labs  Lab 08/03/23 0335 08/04/23 0009 08/05/23 0253 08/06/23 0231 08/07/23 0227 08/08/23 0346 08/09/23 0301  WBC 26.2* 26.5* 25.0* 24.8* 19.0* 17.0* 17.9*  -Follow cultures, tailor antibiotics.   Advised out of bed to chair -On empiric Zosyn, ID following have added IV -TPN was weaned and discontinued -Continue with pain control per general surgery recommendations and she is on cyclobenzaprine 10 mg p.o. nightly, lidocaine 2 patches transdermally q. 12, ketorolac 50 mg IV every 8 as needed for moderate pain as well as methocarbamol   Electrolyte Abnormalities including Hypokalemia Hyponatremia Hypophosphatemia Hypercalcemia, resolved Recent Labs  Lab 08/03/23 0335 08/04/23 0009 08/05/23 0253 08/06/23 0231 08/07/23 0227 08/08/23 0346 08/09/23 0301  NA 129* 128* 130* 127* 134* 132* 133*  K 3.4* 3.7 3.3* 3.4* 3.9 3.8 3.9  MG 2.3 2.3 2.5* 2.4 2.2 2.0 2.0  PHOS 2.7 2.8 3.7 2.5  --   --   --   CALCIUM 9.7 9.4 9.5 9.2 9.4 9.3 9.5  -Continue to monitor replete as necessary and currently getting D5 half-normal saline at 40 mL/h for 1 day -Repeat CMP, mag Phos in the a.m.  Essential hypertension -On Lisinopril 10 mg p.o. nightly -Continue with IV Hydralazine 10 mg every 4 as needed for systolic blood >180 -Continue to monitor blood pressures per protocol -Last blood pressure reading was   Anxiety and Depression -Stable and have resumed Escitalopram 20 mg p.o. daily   COPD -C/w As needed bronchodilators with Dulera, Singulair and as needed albuterol 3 mL IH Q4 as needed wheezing and shortness of breath SpO2: 97 % O2 Flow Rate (L/min): 2 L/min -Desaturated on ambulatory O2 screen    Leukocytosis, worsening in the setting of Intraabdominal Abscesses  -Slow improvement as above    Abnormal LFTs  -Elevated in setting of Abscess along the periphery of R Hepatic Lobe -AST/ALT Trend: Recent  Labs  Lab 08/03/23 0335 08/04/23 0009 08/05/23 0253 08/06/23 0231 08/07/23 0227 08/08/23 0346 08/09/23 0301  AST 73* 49* 49* 44* 41 36 34  ALT 82* 81* 85* 84* 85* 81* 74*  -Continue to Monitor and Trend and repeat CMP in the AM and may need to limit the amount of Acetaminophen that  she is using as it is 1000 mg every 8 hours   Hyperglycemia in the setting of Prediabetes -HbA1c 6.1%.  -Continue sliding scale and Accu-Cheks -CBG trend: Recent Labs  Lab 08/07/23 2351 08/08/23 0529 08/08/23 1129 08/08/23 1756 08/08/23 2036 08/09/23 0421 08/09/23 1141  GLUCAP 152* 147* 154* 115* 127* 103* 106*   Normocytic Anemia -Hgb/Hct Trend relatively stable in mid 8 to low 9 range: Recent Labs  Lab 08/03/23 0335 08/04/23 0009 08/05/23 0253 08/06/23 0231 08/07/23 0227 08/08/23 0346 08/09/23 0301  HGB 9.6* 9.2* 9.3* 9.1* 8.9* 8.6* 8.6*  HCT 28.6* 26.8* 29.1* 28.7* 28.3* 27.2* 26.7*  MCV 95.3 94.7 98.6 98.6 99.6 100.0 97.8  -Continue to monitor for signs or symptoms of bleeding; no overt bleeding noted -Repeat CBC in a.m.  Thrombocytosis -Likely reactive in the setting of above -Platelet Count Trend: Recent Labs  Lab 08/03/23 0335 08/04/23 0009 08/05/23 0253 08/06/23 0231 08/07/23 0227 08/08/23 0346 08/09/23 0301  PLT 360 457* 552* 627* 689* 746* 828*  -Continue to Monitor and Trend and repeat CMP in the AM   Moderate Protein Calorie Malnutrition Nutrition Status: Nutrition Problem: Moderate Malnutrition Etiology: acute illness Signs/Symptoms: mild fat depletion, mild muscle depletion, energy intake < 75% for > 7 days Interventions: TPN  Hypoalbuminemia -Patient's Albumin Trend: Recent Labs  Lab 08/03/23 0335 08/04/23 0009 08/05/23 0253 08/06/23 0231 08/07/23 0227 08/08/23 0346 08/09/23 0301  ALBUMIN 2.3* 2.2* 2.3* 2.3* 2.3* 2.2* 2.2*  -Continue to Monitor and Trend and repeat CMP in the AM  Remove PICC once no longer needed   DVT prophylaxis: enoxaparin (LOVENOX) injection 30 mg Start: 07/27/23 0800 SCD's Start: 07/26/23 2150    Code Status: Full Code Family Communication: Discussed with patient's daughter and other family members at bedside  Disposition Plan:  Level of care: Telemetry Status is: Inpatient Remains inpatient  appropriate because: Needs further surgical, IR and ID clearance prior to discharge for home with home health   Consultants:  Interventional Radiology General Surgery Infectious Diseases  Procedures:  As delineated as above LAPAROSCOPY DIAGNOSTIC CONVERTED TO OPEN SMALL BOWEL RESECTION   Antimicrobials:  Anti-infectives (From admission, onward)    Start     Dose/Rate Route Frequency Ordered Stop   08/08/23 0000  micafungin (MYCAMINE) 100 mg in sodium chloride 0.9 % 100 mL IVPB        100 mg 105 mL/hr over 1 Hours Intravenous Every 24 hours 08/07/23 1549     08/07/23 0000  micafungin (MYCAMINE) 150 mg in sodium chloride 0.9 % 100 mL IVPB  Status:  Discontinued        150 mg 107.5 mL/hr over 1 Hours Intravenous Every 24 hours 08/06/23 2301 08/07/23 1549   07/28/23 1330  piperacillin-tazobactam (ZOSYN) IVPB 3.375 g        3.375 g 12.5 mL/hr over 240 Minutes Intravenous Every 8 hours 07/28/23 1232     07/26/23 1745  piperacillin-tazobactam (ZOSYN) IVPB 3.375 g        3.375 g 12.5 mL/hr over 240 Minutes Intravenous  Once 07/26/23 1737 07/26/23 1931       Subjective: Seen and examined at bedside and was having some abdominal  soreness.  No nausea or vomiting.  Happy that she is off of TPN and tolerating food.  Having bowel movements.  No other concerns or close at this time.  Objective: Vitals:   08/09/23 0421 08/09/23 0917 08/09/23 1141 08/09/23 1840  BP: (!) 156/75  129/65   Pulse: 77  74   Resp: 18  17   Temp: 98.4 F (36.9 C)  98.2 F (36.8 C)   TempSrc: Oral  Oral   SpO2: 99% 98% 99% 97%  Weight:      Height:        Intake/Output Summary (Last 24 hours) at 08/09/2023 1905 Last data filed at 08/09/2023 0300 Gross per 24 hour  Intake 660.45 ml  Output 20 ml  Net 640.45 ml   Filed Weights   08/04/23 0505 08/06/23 0453 08/07/23 0449  Weight: 72.3 kg 65.1 kg 65.6 kg   Examination: Physical Exam:  Constitutional: WN/WD overweight elderly Caucasian female in no  acute distress appears calm Respiratory: Diminished to auscultation bilaterally, no wheezing, rales, rhonchi or crackles. Normal respiratory effort and patient is not tachypenic. No accessory muscle use.  Unlabored breathing but wearing 2 L of supplemental oxygen via nasal cannula Cardiovascular: RRR, no murmurs / rubs / gallops. S1 and S2 auscultated. No appreciable LE extremity edema.  Abdomen: Soft, slightly tender to palpate has 2 drains noted. Bowel sounds positive.  GU: Deferred. Musculoskeletal: No clubbing / cyanosis of digits/nails. No joint deformity upper and lower extremities.  Skin: No rashes, lesions, ulcers on a limited skin evaluation. No induration; Warm and dry.  Neurologic: CN 2-12 grossly intact with no focal deficits. Romberg sign and cerebellar reflexes not assessed.  Psychiatric: Normal judgment and insight. Alert and oriented x 3. Normal mood and appropriate affect.   Data Reviewed: I have personally reviewed following labs and imaging studies  CBC: Recent Labs  Lab 08/03/23 0335 08/04/23 0009 08/05/23 0253 08/06/23 0231 08/07/23 0227 08/08/23 0346 08/09/23 0301  WBC 26.2* 26.5* 25.0* 24.8* 19.0* 17.0* 17.9*  NEUTROABS 20.6* 21.5* 20.3* 19.8*  --   --   --   HGB 9.6* 9.2* 9.3* 9.1* 8.9* 8.6* 8.6*  HCT 28.6* 26.8* 29.1* 28.7* 28.3* 27.2* 26.7*  MCV 95.3 94.7 98.6 98.6 99.6 100.0 97.8  PLT 360 457* 552* 627* 689* 746* 828*   Basic Metabolic Panel: Recent Labs  Lab 08/03/23 0335 08/04/23 0009 08/05/23 0253 08/06/23 0231 08/07/23 0227 08/08/23 0346 08/09/23 0301  NA 129* 128* 130* 127* 134* 132* 133*  K 3.4* 3.7 3.3* 3.4* 3.9 3.8 3.9  CL 100 97* 100 97* 104 103 100  CO2 22 24 23 24 23 23 24   GLUCOSE 125* 125* 143* 125* 151* 142* 98  BUN 19 17 16 16 17 17 15   CREATININE 0.37* 0.53 0.36* 0.50 0.59 0.47 0.56  CALCIUM 9.7 9.4 9.5 9.2 9.4 9.3 9.5  MG 2.3 2.3 2.5* 2.4 2.2 2.0 2.0  PHOS 2.7 2.8 3.7 2.5  --   --   --    GFR: Estimated Creatinine  Clearance: 54.3 mL/min (by C-G formula based on SCr of 0.56 mg/dL). Liver Function Tests: Recent Labs  Lab 08/05/23 0253 08/06/23 0231 08/07/23 0227 08/08/23 0346 08/09/23 0301  AST 49* 44* 41 36 34  ALT 85* 84* 85* 81* 74*  ALKPHOS 234* 247* 235* 211* 202*  BILITOT 0.5 0.5 0.5 0.6 0.4  PROT 5.9* 6.1* 6.1* 6.3* 6.0*  ALBUMIN 2.3* 2.3* 2.3* 2.2* 2.2*   No results for input(s): "LIPASE", "AMYLASE"  in the last 168 hours. No results for input(s): "AMMONIA" in the last 168 hours. Coagulation Profile: Recent Labs  Lab 08/05/23 0253  INR 1.1   Cardiac Enzymes: No results for input(s): "CKTOTAL", "CKMB", "CKMBINDEX", "TROPONINI" in the last 168 hours. BNP (last 3 results) No results for input(s): "PROBNP" in the last 8760 hours. HbA1C: No results for input(s): "HGBA1C" in the last 72 hours. CBG: Recent Labs  Lab 08/08/23 1129 08/08/23 1756 08/08/23 2036 08/09/23 0421 08/09/23 1141  GLUCAP 154* 115* 127* 103* 106*   Lipid Profile: No results for input(s): "CHOL", "HDL", "LDLCALC", "TRIG", "CHOLHDL", "LDLDIRECT" in the last 72 hours. Thyroid Function Tests: No results for input(s): "TSH", "T4TOTAL", "FREET4", "T3FREE", "THYROIDAB" in the last 72 hours. Anemia Panel: No results for input(s): "VITAMINB12", "FOLATE", "FERRITIN", "TIBC", "IRON", "RETICCTPCT" in the last 72 hours. Sepsis Labs: No results for input(s): "PROCALCITON", "LATICACIDVEN" in the last 168 hours.  Recent Results (from the past 240 hours)  Culture, blood (Routine X 2) w Reflex to ID Panel     Status: None   Collection Time: 08/02/23  4:08 PM   Specimen: BLOOD LEFT ARM  Result Value Ref Range Status   Specimen Description   Final    BLOOD LEFT ARM Performed at [facility], [address] [place], [place] [address]    Special Requests   Final    BOTTLES DRAWN AEROBIC AND ANAEROBIC Blood Culture results may not be optimal due to an inadequate volume of blood received in culture  bottles Performed at [facility], [address] [place], [place] [address]    Culture   Final    NO GROWTH 5 DAYS Performed at [facility], [address], [place], [place] [address]    Report Status 08/07/2023 FINAL  Final  Culture, blood (Routine X 2) w Reflex to ID Panel     Status: None   Collection Time: 08/02/23  4:16 PM   Specimen: BLOOD RIGHT HAND  Result Value Ref Range Status   Specimen Description   Final    BLOOD RIGHT HAND Performed at [facility], [address], [place], [place] [address]    Special Requests   Final    BOTTLES DRAWN AEROBIC AND ANAEROBIC Blood Culture results may not be optimal due to an inadequate volume of blood received in culture bottles Performed at [facility], [address], [place], [place] [address]    Culture   Final    NO GROWTH 5 DAYS Performed at [facility], [address], [place], [place] [address]    Report Status 08/07/2023 FINAL  Final  Aerobic/Anaerobic Culture w Gram Stain (surgical/deep wound)     Status: None (Preliminary result)   Collection Time: 08/06/23  1:49 PM   Specimen: Abscess  Result Value Ref Range Status   Specimen Description   Final    ABSCESS Performed at [facility], [address], [place], [place] [address]    Special Requests   Final    Normal Performed at [facility], [address], [place], [place] [address]    Gram Stain   Final    ABUNDANT WBC PRESENT, PREDOMINANTLY PMN RARE BUDDING YEAST SEEN Performed at [facility], [address], [place], [place] [address]    Culture   Final    RARE CANDIDA ALBICANS NO ANAEROBES ISOLATED; CULTURE IN PROGRESS FOR 5 DAYS    Report Status  PENDING  Incomplete  Aerobic/Anaerobic Culture w Gram Stain (surgical/deep wound)     Status: None (Preliminary result)   Collection Time: 08/06/23  1:50 PM   Specimen: Abscess  Result Value Ref Range Status    Specimen Description   Final    ABSCESS Performed at [facility], [address] [place], [place] [address]    Special Requests   Final    Normal Performed at [facility], [address], [place], [place] [address]    Gram Stain   Final    FEW WBC PRESENT,BOTH PMN AND MONONUCLEAR NO ORGANISMS SEEN    Culture   Final    NO GROWTH 3 DAYS NO ANAEROBES ISOLATED; CULTURE IN PROGRESS FOR 5 DAYS Performed at [facility], [address] [place], [place] [address]    Report Status PENDING  Incomplete    Radiology Studies: No results found.  Scheduled Meds:  acetaminophen  1,000 mg Oral Q8H   Chlorhexidine Gluconate Cloth  6 each Topical Daily   cyclobenzaprine  10 mg Oral QHS   enoxaparin (LOVENOX) injection  30 mg Subcutaneous Q24H   escitalopram  20 mg Oral Daily   feeding supplement  237 mL Oral TID WC   Gerhardt's butt cream   Topical BID   lidocaine  2 patch Transdermal Q24H   lisinopril  10 mg Oral QHS   mometasone-formoterol  2 puff Inhalation BID   montelukast  10 mg Oral QHS   nicotine  21 mg Transdermal Daily   simvastatin  40 mg Oral Daily   sodium chloride flush  10-40 mL Intracatheter Q12H   sodium chloride flush  5 mL Intracatheter Q8H   Continuous Infusions:  dextrose 5 % and 0.45 % NaCl 40 mL/hr at 08/09/23 0622   micafungin (MYCAMINE) 100 mg in sodium chloride 0.9 % 100 mL IVPB 100 mg (08/09/23 0040)   piperacillin-tazobactam (ZOSYN)  IV 3.375 g (08/09/23 1526)    LOS: 14 days   Marguerita Merles, DO [facility] Available via Epic secure chat 7am-7pm After these hours, please refer to coverage provider listed on [email or web address] 08/09/2023, 7:05 PM

## 2023-08-09 NOTE — Progress Notes (Signed)
Physical Therapy Treatment Patient Details Name: Laura Bradshaw MRN: [id number] DOB: [birth year] Today's Date: 08/09/2023   History of Present Illness Pt is 72 yo female admitted on 07/26/23 with SBO, perforation, jejunal ischemia and is s/p open small bowel resection on 07/26/23.  Midline wound open - possible wound vac placement Monday, 12/9.  Pt with hx including but not limited to HTN, anxiety, depression, COPD, HSV, low back pain, mild dementia.    PT Comments  General Comments: AxO x 3 very sweet Lady.  Nervous.  Feeling poorly.  Daughter and Son in [place] present.  Very supportive. Pt hopes to be able to go home by CHRISTMAS.   Assisted OOB to amb in hallway.  General transfer comment: 25% VC's on proper hand placement to avoid pulling up on walker as well as safety with turn completion using walker.  Also asissted with a BSC tranfer/peri care. General Gait Details: pt tolerated an increased distance of 40 feet x 2 needing one seated rest break du7e to weakness/fatigue.  Ra decreased from 92% at rest 84% so reapplied 2 lts nasal cannula to return to low 90"s.  Avg HR 91.  Son in Social worker asissted by following with recliner. Assisted back to bed per pt request/comfort.   Pt plans to D/C to home.  Has a VERY supportive daughter and Son In [place].    SATURATION QUALIFICATIONS: (This note is used to comply with regulatory documentation for home oxygen)  Patient Saturations on Room Air at Rest = 91%  Patient Saturations on Room Air while Ambulating 40 feet = 84%  Patient Saturations on 2 Liters of oxygen while Ambulating = 90%  Please briefly explain why patient needs home oxygen:  Pt required supplemental oxygen during activity to achieve therapeutic levels.    If plan is discharge home, recommend the following: A little help with walking and/or transfers;A little help with bathing/dressing/bathroom;Help with stairs or ramp for entrance;Assistance with cooking/housework   Can travel by private  Scientist, research (medical) walker (2 wheels)    Recommendations for Other Services       Precautions / Restrictions Precautions Precautions: Fall Precaution Comments: abdominal wound vac, 1 bulb drain RIGHT Restrictions Weight Bearing Restrictions Per Provider Order: No     Mobility  Bed Mobility Overal bed mobility: Needs Assistance Bed Mobility: Rolling, Sidelying to Sit Rolling: Supervision, Contact guard assist Sidelying to sit: Min assist, Used rails       General bed mobility comments: increased time and assist with upper body as well as scooting to EOB    Transfers Overall transfer level: Needs assistance Equipment used: Rolling walker (2 wheels) Transfers: Bed to chair/wheelchair/BSC, Sit to/from Stand Sit to Stand: Contact guard assist, Min assist           General transfer comment: 25% VC's on proper hand placement to avoid pulling up on walker as well as safety with turn completion using walker.  Also asissted with a BSC tranfer/peri care.    Ambulation/Gait Ambulation/Gait assistance: Contact guard assist, Min assist Gait Distance (Feet): 80 Feet (40 feet x 2) Assistive device: Rolling walker (2 wheels) Gait Pattern/deviations: Decreased stride length, Step-through pattern Gait velocity: decreased     General Gait Details: pt tolerated an increased distance of 40 feet x 2 needing one seated rest break du7e to weakness/fatigue.  Ra decreased from 92% at rest 84% so reapplied 2 lts nasal cannula to return to low 90"s.  Avg HR  8.  Son in law asissted by following with recliner.   Stairs             Wheelchair Mobility     Tilt Bed    Modified Rankin (Stroke Patients Only)       Balance                                            Cognition Arousal: Alert Behavior During Therapy: WFL for tasks assessed/performed Overall Cognitive Status: Within Functional Limits for tasks assessed                                  General Comments: AxO x 3 very sweet Lady.  Nervous.  Feeling poorly.  Daughter and Son in [place] present.  Very supportive.        Exercises      General Comments        Pertinent Vitals/Pain Pain Assessment Pain Assessment: No/denies pain    Home Living                          Prior Function            PT Goals (current goals can now be found in the care plan section) Progress towards PT goals: Progressing toward goals    Frequency    Min 1X/week      PT Plan      Co-evaluation              AM-PAC PT "6 Clicks" Mobility   Outcome Measure  Help needed turning from your back to your side while in a flat bed without using bedrails?: A Lot Help needed moving from lying on your back to sitting on the side of a flat bed without using bedrails?: A Lot Help needed moving to and from a bed to a chair (including a wheelchair)?: A Lot Help needed standing up from a chair using your arms (e.g., wheelchair or bedside chair)?: A Lot Help needed to walk in hospital room?: A Lot Help needed climbing 3-5 steps with a railing? : A Lot 6 Click Score: 12    End of Session Equipment Utilized During Treatment: Gait belt Activity Tolerance: Patient limited by fatigue Patient left: in bed;with call bell/phone within reach;with family/visitor present;with bed alarm set Nurse Communication: Mobility status PT Visit Diagnosis: Difficulty in walking, not elsewhere classified (R26.2)     Time: 4098-1191 PT Time Calculation (min) (ACUTE ONLY): 25 min  Charges:    $Gait Training: 8-22 mins $Therapeutic Activity: 8-22 mins PT General Charges $$ ACUTE PT VISIT: 1 Visit                    Felecia Shelling  PTA Acute  Rehabilitation Services Office M-F          [phone]

## 2023-08-09 NOTE — Progress Notes (Signed)
14 Days Post-Op   Subjective/Chief Complaint: NAEO. Appetite slowly improving, eating more solid food. Had a BM this morning.  Objective: Vital signs in last 24 hours: Temp:  [97.9 F (36.6 C)-98.4 F (36.9 C)] 98.4 F (36.9 C) (12/19 0421) Pulse Rate:  [77-84] 77 (12/19 0421) Resp:  [18] 18 (12/19 0421) BP: (135-156)/(56-75) 156/75 (12/19 0421) SpO2:  [98 %-100 %] 99 % (12/19 0421) Last BM Date : 08/08/23  Intake/Output from previous day: 12/18 0701 - 12/19 0700 In: 1490 [I.V.:1295; IV Piggyback:195] Out: 20 [Drains:20] Intake/Output this shift: No intake/output data recorded.  Alert, NAD Resp: normal work of breathing Abd: soft, nondistended, vac in place holding suction. RUQ drain with minimal purulent fluid, transgluteal drain serosanguinous.   Lab Results:  Recent Labs    08/08/23 0346 08/09/23 0301  WBC 17.0* 17.9*  HGB 8.6* 8.6*  HCT 27.2* 26.7*  PLT 746* 828*   BMET Recent Labs    08/08/23 0346 08/09/23 0301  NA 132* 133*  K 3.8 3.9  CL 103 100  CO2 23 24  GLUCOSE 142* 98  BUN 17 15  CREATININE 0.47 0.56  CALCIUM 9.3 9.5   PT/INR No results for input(s): "LABPROT", "INR" in the last 72 hours.  ABG No results for input(s): "PHART", "HCO3" in the last 72 hours.  Invalid input(s): "PCO2", "PO2"  Studies/Results: No results found.    Anti-infectives: Anti-infectives (From admission, onward)    Start     Dose/Rate Route Frequency Ordered Stop   08/08/23 0000  micafungin (MYCAMINE) 100 mg in sodium chloride 0.9 % 100 mL IVPB        100 mg 105 mL/hr over 1 Hours Intravenous Every 24 hours 08/07/23 1549     08/07/23 0000  micafungin (MYCAMINE) 150 mg in sodium chloride 0.9 % 100 mL IVPB  Status:  Discontinued        150 mg 107.5 mL/hr over 1 Hours Intravenous Every 24 hours 08/06/23 2301 08/07/23 1549   07/28/23 1330  piperacillin-tazobactam (ZOSYN) IVPB 3.375 g        3.375 g 12.5 mL/hr over 240 Minutes Intravenous Every 8 hours 07/28/23  1232     07/26/23 1745  piperacillin-tazobactam (ZOSYN) IVPB 3.375 g        3.375 g 12.5 mL/hr over 240 Minutes Intravenous  Once 07/26/23 1737 07/26/23 1931       Assessment/Plan: Closed loop SBO with ischemic small bowel and perforation  POD 14 s/p laparoscopy converted to open small bowel resection Dr. Maisie Fus - Ileus resolved, continue soft diet. TPN off. - s/p IR drain x2 on 12/16. Abx per ID. Scant yeast in one drain, cultures otherwise negative. - Will discus removal of transgluteal drain today with IR as output is minimal and serosanguinous. - VAC M/W/F - Mobilize, PT recommending home health - continue tylenol, toradol, lidoderm for pain    Severe protein calorie malnutrition - Soft diet, protein shakes, appetite improving   Sophronia Simas, MD [facility] General, Hepatobiliary and Pancreatic Surgery 08/09/23 8:23 AM

## 2023-08-09 NOTE — Progress Notes (Signed)
Referring Physician(s): Darnelle Spangle  Supervising Physician: Simonne Come  Patient Status:  [facility] - In-pt  Chief Complaint: Abdominal/pelvic fluid collections    Subjective: Patient without new complaints; states she feels a little better today.  Has ambulated.  Denies fever, nausea, vomiting, worsening abdominal pain.   Allergies: Ciprofloxacin, Codeine, Sulfonamide derivatives, and Hydrocodone  Medications: Prior to Admission medications   Medication Sig Start Date End Date Taking? Authorizing Provider  acetaminophen (TYLENOL) 500 MG tablet Take 1,000 mg by mouth as needed for moderate pain (pain score 4-6).   Yes [provider]  albuterol (VENTOLIN HFA) 108 (90 Base) MCG/ACT inhaler Inhale 2 puffs into the lungs every 4 (four) hours as needed for wheezing or shortness of breath. 07/25/23  Yes [provider]  aspirin 325 MG tablet Take 325 mg by mouth daily.   Yes [provider]  cyclobenzaprine (FLEXERIL) 10 MG tablet Take 10 mg by mouth at bedtime. 06/04/23  Yes [provider]  diclofenac (VOLTAREN) 75 MG EC tablet Take 75 mg by mouth 3 (three) times daily.   Yes [provider]  escitalopram (LEXAPRO) 20 MG tablet Take 20 mg by mouth daily.   Yes [provider]  fluticasone (FLONASE) 50 MCG/ACT nasal spray Place 2 sprays into both nostrils as needed for allergies. 07/25/23  Yes [provider]  fluticasone-salmeterol (ADVAIR) 250-50 MCG/ACT AEPB Inhale 2 puffs into the lungs as needed (wheezing/SOB). 07/25/23  Yes [provider]  gabapentin (NEURONTIN) 300 MG capsule Take 300 mg by mouth 3 (three) times daily.   Yes [provider]  hydrochlorothiazide (HYDRODIURIL) 25 MG tablet Take 25 mg by mouth daily.   Yes [provider]  lisinopril (ZESTRIL) 10 MG tablet Take 10 mg by mouth at bedtime.   Yes [provider]  montelukast (SINGULAIR) 10 MG tablet Take 10 mg by mouth at  bedtime.   Yes [provider]  simvastatin (ZOCOR) 40 MG tablet Take 40 mg by mouth daily.   Yes [provider]  Sodium Citrate Dihydrate (EMETROL) 230 MG CHEW Chew 460 mg by mouth as needed (nausea).   Yes [provider]  ALPRAZolam Prudy Feeler) 0.5 MG tablet Take 0.5 mg by mouth in the morning and at bedtime.    [provider]  naproxen (NAPROSYN) 500 MG tablet Take 500 mg by mouth 2 (two) times daily with a meal. Patient not taking: Reported on 07/27/2023    [provider]     Vital Signs: BP 129/65 (BP Location: Left Arm)   Pulse 74   Temp 98.2 F (36.8 C) (Oral)   Resp 17   Ht 5\' 1"  (1.549 m)   Wt 144 lb 10 oz (65.6 kg)   SpO2 99%   BMI 27.33 kg/m   Physical Exam ; awake, alert.  Right lower abdominal drain intact, insertion site okay, mildly tender to palpation, output 10 to 15 cc purulent beige-colored fluid.  Drain flushed earlier today without difficulty.  Transgluteal drain removed today by CCS team.  Imaging: CT GUIDED PERITONEAL/RETROPERITONEAL FLUID DRAIN BY PERC CATH Result Date: 08/06/2023 CLINICAL DATA:  Status post small bowel resection for closed loop obstruction with small bowel ischemia and perforation on 07/26/2023. Development of postoperative leukocytosis and fluid collections primarily adjacent to the liver and in the lower posterior pelvis. EXAM: 1. CT GUIDED PERCUTANEOUS CATHETER DRAINAGE OF RIGHT ABDOMINAL PERITONEAL ABSCESS 2. CT-GUIDED PERCUTANEOUS CATHETER DRAINAGE OF PELVIC PERITONEAL ABSCESS ANESTHESIA/SEDATION: Moderate (conscious) sedation was employed during this  procedure. A total of Versed 2.5 mg and Fentanyl 100 mcg was administered intravenously. Moderate Sedation Time: 52 minutes. The patient's level of consciousness and vital signs were monitored continuously by radiology nursing throughout the procedure under my direct supervision. PROCEDURE: The procedure, risks, benefits, and alternatives were explained  to the patient. Questions regarding the procedure were encouraged and answered. The patient understands and consents to the procedure. A time out was performed prior to initiating the procedure. Initial CT through the upper to mid abdomen was performed in a supine position. The right abdominal wall was prepped with chlorhexidine in a sterile fashion, and a sterile drape was applied covering the operative field. A sterile gown and sterile gloves were used for the procedure. Local anesthesia was provided with 1% Lidocaine. Under CT guidance, an 18 gauge trocar needle was advanced into a fluid collection along the inferior aspect of the liver. After confirming needle tip position, a guidewire was advanced. The tract was dilated over the guidewire and a 10 French percutaneous drainage catheter advanced. Final catheter position was confirmed by CT. A fluid sample was withdrawn and sent for culture analysis. The catheter was then connected to suction bulb drainage. It was secured at the skin exit site with a Prolene retention suture, adhesive StatLock device and overlying dressing. Imaging was performed in a left lateral decubitus position followed by a right lateral decubitus position with the left side up. From a left transgluteal approach, a new 18 gauge trocar needle was advanced into a deep pelvic fluid collection. Confirming needle tip position, a guidewire was advanced into the collection and wire position confirmed by CT. The percutaneous tract was dilated over the guidewire and a 10 French percutaneous drainage catheter advanced over the wire. Final catheter position was confirmed by CT. A fluid sample was withdrawn from the drain and sent for culture analysis. The catheter was connected to suction bulb drainage. It was secured at the skin exit site with a Prolene retention suture, adhesive StatLock device and overlying dressing. RADIATION DOSE REDUCTION: This exam was performed according to the departmental  dose-optimization program which includes automated exposure control, adjustment of the mA and/or kV according to patient size and/or use of iterative reconstruction technique. COMPLICATIONS: None FINDINGS: The fluid collection spanning along much of the anterolateral aspect of the liver was first addressed. From an anterior approach, the inferior aspect of this collection was punctured in a cranial direction such that the guidewire advanced superiorly towards the superior aspect of the collection. After drain placement, the drainage catheter extends up towards the superior aspect. There was return of purulent fluid. Initial decubitus imaging with the right side up was not optimal for approach to the posterior pelvic fluid collection. From the opposite decubitus position, a left transgluteal approach was chosen for access. The fluid collection yielded dark bloody fluid. IMPRESSION: 1. Right-sided perihepatic abscess yielded purulent fluid. A 10 French drainage catheter was placed and attached to suction bulb drainage. A sample of purulent fluid from this abscess was sent for culture analysis. 2. Left transgluteal approach to the deep pelvic abscess yielded dark bloody fluid. A 10 French drainage catheter was placed and attached to suction bulb drainage. A sample of bloody fluid from this collection was sent for culture analysis. Electronically Signed   By: Irish Lack M.D.   On: 08/06/2023 15:40   CT GUIDED PERITONEAL/RETROPERITONEAL FLUID DRAIN BY PERC CATH Result Date: 08/06/2023 CLINICAL DATA:  Status post small bowel resection for closed loop obstruction with small  bowel ischemia and perforation on 07/26/2023. Development of postoperative leukocytosis and fluid collections primarily adjacent to the liver and in the lower posterior pelvis. EXAM: 1. CT GUIDED PERCUTANEOUS CATHETER DRAINAGE OF RIGHT ABDOMINAL PERITONEAL ABSCESS 2. CT-GUIDED PERCUTANEOUS CATHETER DRAINAGE OF PELVIC PERITONEAL ABSCESS  ANESTHESIA/SEDATION: Moderate (conscious) sedation was employed during this procedure. A total of Versed 2.5 mg and Fentanyl 100 mcg was administered intravenously. Moderate Sedation Time: 52 minutes. The patient's level of consciousness and vital signs were monitored continuously by radiology nursing throughout the procedure under my direct supervision. PROCEDURE: The procedure, risks, benefits, and alternatives were explained to the patient. Questions regarding the procedure were encouraged and answered. The patient understands and consents to the procedure. A time out was performed prior to initiating the procedure. Initial CT through the upper to mid abdomen was performed in a supine position. The right abdominal wall was prepped with chlorhexidine in a sterile fashion, and a sterile drape was applied covering the operative field. A sterile gown and sterile gloves were used for the procedure. Local anesthesia was provided with 1% Lidocaine. Under CT guidance, an 18 gauge trocar needle was advanced into a fluid collection along the inferior aspect of the liver. After confirming needle tip position, a guidewire was advanced. The tract was dilated over the guidewire and a 10 French percutaneous drainage catheter advanced. Final catheter position was confirmed by CT. A fluid sample was withdrawn and sent for culture analysis. The catheter was then connected to suction bulb drainage. It was secured at the skin exit site with a Prolene retention suture, adhesive StatLock device and overlying dressing. Imaging was performed in a left lateral decubitus position followed by a right lateral decubitus position with the left side up. From a left transgluteal approach, a new 18 gauge trocar needle was advanced into a deep pelvic fluid collection. Confirming needle tip position, a guidewire was advanced into the collection and wire position confirmed by CT. The percutaneous tract was dilated over the guidewire and a 10 French  percutaneous drainage catheter advanced over the wire. Final catheter position was confirmed by CT. A fluid sample was withdrawn from the drain and sent for culture analysis. The catheter was connected to suction bulb drainage. It was secured at the skin exit site with a Prolene retention suture, adhesive StatLock device and overlying dressing. RADIATION DOSE REDUCTION: This exam was performed according to the departmental dose-optimization program which includes automated exposure control, adjustment of the mA and/or kV according to patient size and/or use of iterative reconstruction technique. COMPLICATIONS: None FINDINGS: The fluid collection spanning along much of the anterolateral aspect of the liver was first addressed. From an anterior approach, the inferior aspect of this collection was punctured in a cranial direction such that the guidewire advanced superiorly towards the superior aspect of the collection. After drain placement, the drainage catheter extends up towards the superior aspect. There was return of purulent fluid. Initial decubitus imaging with the right side up was not optimal for approach to the posterior pelvic fluid collection. From the opposite decubitus position, a left transgluteal approach was chosen for access. The fluid collection yielded dark bloody fluid. IMPRESSION: 1. Right-sided perihepatic abscess yielded purulent fluid. A 10 French drainage catheter was placed and attached to suction bulb drainage. A sample of purulent fluid from this abscess was sent for culture analysis. 2. Left transgluteal approach to the deep pelvic abscess yielded dark bloody fluid. A 10 French drainage catheter was placed and attached to suction bulb drainage. A  sample of bloody fluid from this collection was sent for culture analysis. Electronically Signed   By: Irish Lack M.D.   On: 08/06/2023 15:40    Labs:  CBC: Recent Labs    08/06/23 0231 08/07/23 0227 08/08/23 0346 08/09/23 0301   WBC 24.8* 19.0* 17.0* 17.9*  HGB 9.1* 8.9* 8.6* 8.6*  HCT 28.7* 28.3* 27.2* 26.7*  PLT 627* 689* 746* 828*    COAGS: Recent Labs    08/05/23 0253  INR 1.1    BMP: Recent Labs    08/06/23 0231 08/07/23 0227 08/08/23 0346 08/09/23 0301  NA 127* 134* 132* 133*  K 3.4* 3.9 3.8 3.9  CL 97* 104 103 100  CO2 24 23 23 24   GLUCOSE 125* 151* 142* 98  BUN 16 17 17 15   CALCIUM 9.2 9.4 9.3 9.5  CREATININE 0.50 0.59 0.47 0.56  GFRNONAA >60 >60 >60 >60    LIVER FUNCTION TESTS: Recent Labs    08/06/23 0231 08/07/23 0227 08/08/23 0346 08/09/23 0301  BILITOT 0.5 0.5 0.6 0.4  AST 44* 41 36 34  ALT 84* 85* 81* 74*  ALKPHOS 247* 235* 211* 202*  PROT 6.1* 6.1* 6.3* 6.0*  ALBUMIN 2.3* 2.3* 2.2* 2.2*    Assessment and Plan: 71 yo female with history of loop small bowel obstruction with ischemic small bowel perforation, status post laparoscopy converted to open small bowel resection on 12/5; status post drainage of post op perihepatic and left pelvic abscesses on 12/16; afebrile, WBC 17.9 up slightly from 17, hemoglobin stable at 8.6, creatinine normal, abdominal abscess culture growing  rare Candida albicans Left pelvic/transgluteal drain removed today by CCS team.  Output by Drain (mL) 08/07/23 0701 - 08/07/23 1900 08/07/23 1901 - 08/08/23 0700 08/08/23 0701 - 08/08/23 1900 08/08/23 1901 - 08/09/23 0700 08/09/23 0701 - 08/09/23 1525  Closed System Drain 1 RUQ Bulb (JP) 10 Fr.  50  10   Closed System Drain 1 Left Buttock Bulb (JP) 10 Fr.  20  10   Negative Pressure Wound Therapy Abdomen Medial  0  0    Continue current treatment, drain irrigation, close output follow-up, lab checks; obtain follow-up CT once drain output minimal or if WBC abruptly increases; will likely need drain injection prior to removal.  Other plans as per CCS/TRH/ID     Electronically Signed: D. Jeananne Rama, PA-C 08/09/2023, 3:21 PM   I spent a total of 15 Minutes at the the patient's bedside AND on  the patient's hospital floor or unit, greater than 50% of which was counseling/coordinating care for right abdominal abscess drain    Patient ID: Laura Bradshaw, female   DOB: August 09, 1951, 72 y.o.   MRN: [id number]

## 2023-08-09 NOTE — Plan of Care (Signed)
  Problem: Coping: Goal: Level of anxiety will decrease Outcome: Progressing   Problem: Skin Integrity: Goal: Risk for impaired skin integrity will decrease Outcome: Progressing   Problem: Metabolic: Goal: Ability to maintain appropriate glucose levels will improve Outcome: Progressing   Problem: Tissue Perfusion: Goal: Adequacy of tissue perfusion will improve Outcome: Progressing

## 2023-08-10 DIAGNOSIS — K56609 Unspecified intestinal obstruction, unspecified as to partial versus complete obstruction: Secondary | ICD-10-CM | POA: Diagnosis not present

## 2023-08-10 DIAGNOSIS — K651 Peritoneal abscess: Secondary | ICD-10-CM | POA: Diagnosis not present

## 2023-08-10 DIAGNOSIS — E440 Moderate protein-calorie malnutrition: Secondary | ICD-10-CM | POA: Diagnosis not present

## 2023-08-10 LAB — CBC WITH DIFFERENTIAL/PLATELET
Abs Immature Granulocytes: 0.27 10*3/uL — ABNORMAL HIGH (ref 0.00–0.07)
Basophils Absolute: 0.1 10*3/uL (ref 0.0–0.1)
Basophils Relative: 1 %
Eosinophils Absolute: 0.5 10*3/uL (ref 0.0–0.5)
Eosinophils Relative: 3 %
HCT: 27.5 % — ABNORMAL LOW (ref 36.0–46.0)
Hemoglobin: 8.6 g/dL — ABNORMAL LOW (ref 12.0–15.0)
Immature Granulocytes: 2 %
Lymphocytes Relative: 15 %
Lymphs Abs: 2.6 10*3/uL (ref 0.7–4.0)
MCH: 31 pg (ref 26.0–34.0)
MCHC: 31.3 g/dL (ref 30.0–36.0)
MCV: 99.3 fL (ref 80.0–100.0)
Monocytes Absolute: 1.7 10*3/uL — ABNORMAL HIGH (ref 0.1–1.0)
Monocytes Relative: 9 %
Neutro Abs: 13 10*3/uL — ABNORMAL HIGH (ref 1.7–7.7)
Neutrophils Relative %: 70 %
Platelets: 856 10*3/uL — ABNORMAL HIGH (ref 150–400)
RBC: 2.77 MIL/uL — ABNORMAL LOW (ref 3.87–5.11)
RDW: 13.9 % (ref 11.5–15.5)
WBC: 18.3 10*3/uL — ABNORMAL HIGH (ref 4.0–10.5)
nRBC: 0 % (ref 0.0–0.2)

## 2023-08-10 LAB — COMPREHENSIVE METABOLIC PANEL
ALT: 74 U/L — ABNORMAL HIGH (ref 0–44)
AST: 39 U/L (ref 15–41)
Albumin: 2.2 g/dL — ABNORMAL LOW (ref 3.5–5.0)
Alkaline Phosphatase: 205 U/L — ABNORMAL HIGH (ref 38–126)
Anion gap: 7 (ref 5–15)
BUN: 12 mg/dL (ref 8–23)
CO2: 27 mmol/L (ref 22–32)
Calcium: 9.6 mg/dL (ref 8.9–10.3)
Chloride: 98 mmol/L (ref 98–111)
Creatinine, Ser: 0.57 mg/dL (ref 0.44–1.00)
GFR, Estimated: >60 mL/min (ref >60)
Glucose, Bld: 101 mg/dL — ABNORMAL HIGH (ref 70–99)
Potassium: 3.8 mmol/L (ref 3.5–5.1)
Sodium: 132 mmol/L — ABNORMAL LOW (ref 135–145)
Total Bilirubin: 0.4 mg/dL (ref <1.2)
Total Protein: 5.9 g/dL — ABNORMAL LOW (ref 6.5–8.1)

## 2023-08-10 LAB — PHOSPHORUS: Phosphorus: 4 mg/dL (ref 2.5–4.6)

## 2023-08-10 LAB — MAGNESIUM: Magnesium: 2 mg/dL (ref 1.7–2.4)

## 2023-08-10 MED ORDER — FLUCONAZOLE 200 MG PO TABS
400.0000 mg | ORAL_TABLET | Freq: Every day | ORAL | Status: DC
  Administered 2023-08-10 – 2023-08-11 (×2): 400 mg via ORAL
  Filled 2023-08-10 (×3): qty 2

## 2023-08-10 MED ORDER — SODIUM CHLORIDE 0.9 % IV SOLN
3.0000 g | Freq: Four times a day (QID) | INTRAVENOUS | Status: DC
  Administered 2023-08-10 – 2023-08-12 (×8): 3 g via INTRAVENOUS
  Filled 2023-08-10 (×9): qty 8

## 2023-08-10 MED ORDER — METHOCARBAMOL 500 MG PO TABS: 500.0000 mg | ORAL_TABLET | Freq: Three times a day (TID) | ORAL | 0 refills | Status: AC | PRN

## 2023-08-10 MED ORDER — OXYCODONE HCL 5 MG PO TABS: 5.0000 mg | ORAL_TABLET | ORAL | 0 refills | Status: AC | PRN

## 2023-08-10 MED ORDER — FUROSEMIDE 10 MG/ML IJ SOLN
40.0000 mg | Freq: Once | INTRAMUSCULAR | Status: AC
  Administered 2023-08-10: 40 mg via INTRAVENOUS
  Filled 2023-08-10: qty 4

## 2023-08-10 NOTE — Progress Notes (Signed)
PROGRESS NOTE    Laura Bradshaw  NFA:[id number] DOB: 04/24/1951 DOA: 07/26/2023 PCP: Alberteen Sam, FNP   Brief Narrative:  The patient is a 12 with history of HTN, anxiety, depression, COPD, HSV, low back pain, mild dementia comes to the hospital with acute abdominal pain and vomiting.  She was found to have closed-loop obstruction with ischemia.  Taken to the OR for laparoscopic which was eventually converted to open requiring small bowel resection due to perforation on 07/26/23 by Dr. Maisie Fus.   Postop developed ileus requiring NG tube later repeat scan showed intra-abdominal abscess therefore IR consulted.  ID has been following and patient has been on broad-spectrum IV antibiotics.  Patient underwent CT-guided right abdominal/pelvic asp/drainage on 08/06/23.  Her p.o. intake continues to improve and TPN has been weaned off.  Her left pelvic/transgluteal drain is removed today and cultures are also growing rare Candida albicans so ID added micafungin for the abscess culture but are hoping that is fluconazole sensitive.  Continue IV Zosyn and micafungin for now.  She did desaturate on home ambulatory O2 screen today and PT OT recommending discharging back home when she is medically stable.  Assessment & Plan:  Principal Problem:   Intra-abdominal abscess (HCC) Active Problems:   Small bowel obstruction (HCC)   Malnutrition of moderate degree   Small bowel obstruction, perforation with ischemia requiring open resection Postop complicated by ileus and intra-abdominal abscesses -Initial surgery performed on 12/5.  Postop developed ileus requiring NG tube which has now been removed.   -Repeat scan shows concerns of abscess therefore IR performed CT-guided drainage of the right abdominal/pelvic abscess and 2 drains were placed.  Currently one of them has been removed -WBC Trend: Recent Labs  Lab 08/04/23 0009 08/05/23 0253 08/06/23 0231 08/07/23 0227 08/08/23 0346 08/09/23 0301  08/10/23 0346  WBC 26.5* 25.0* 24.8* 19.0* 17.0* 17.9* 18.3*  -Follow cultures, tailor antibiotics.  Advised out of bed to chair -Was on empiric Zosyn, ID following have added Micafungin; now infectious disease has changed her to Unasyn and changing to fluconazole given the fact that most Candida albicans species are sensitive to the azoles: Per ID if she does well on Unasyn and the regular transition to oral equivalent of Augmentin -TPN was weaned and discontinued -Continue with pain control per general surgery recommendations and she is on cyclobenzaprine 10 mg p.o. nightly, lidocaine 2 patches transdermally q. 12, ketorolac 50 mg IV every 8 as needed for moderate pain as well as methocarbamol as well as IV morphine 1 mg every 4 as needed severe pain -Patient has an infectious disease appointment on 08/29/2023 with Dr. Thedore Mins at 10 AM -IR recommending continuing current treatment and drain irrigation and recommending follow-up CT 1 drainage output is minimal or if WBC abruptly increases and recommends that she will need drain irrigation prior to removal -WOC nurse changed her wound VAC today -Surgery has signed off the patient's case now and home health and wound VAC are being arranged given that she is approaching surgical readiness for discharge   Electrolyte Abnormalities including Hypokalemia Hyponatremia Hypophosphatemia Hypercalcemia, resolved Recent Labs  Lab 08/04/23 0009 08/05/23 0253 08/06/23 0231 08/07/23 0227 08/08/23 0346 08/09/23 0301 08/10/23 0346  NA 128* 130* 127* 134* 132* 133* 132*  K 3.7 3.3* 3.4* 3.9 3.8 3.9 3.8  MG 2.3 2.5* 2.4 2.2 2.0 2.0 2.0  PHOS 2.8 3.7 2.5  --   --   --  4.0  CALCIUM 9.4 9.5 9.2 9.4 9.3 9.5 9.6  -  IVF now stopped  -Repeat CMP, mag Phos in the a.m.  Essential Hypertension -On Lisinopril 10 mg p.o. nightly -Continue with IV Hydralazine 10 mg every 4 as needed for systolic blood >180 -Continue to monitor blood pressures per protocol -Last  blood pressure reading was 151/62   Anxiety and Depression -Stable and have resumed Escitalopram 20 mg p.o. daily   COPD -C/w As needed bronchodilators with Dulera, Singulair and as needed albuterol 3 mL IH Q4 as needed wheezing and shortness of breath SpO2: 99 % O2 Flow Rate (L/min): 2 L/min -Desaturated on ambulatory O2 screen yesterday and will repeat again today and give her a dose of Lasix given volume overload -Check chest x-ray in the a.m.    Leukocytosis, worsened in the setting of Intraabdominal Abscesses  -See Above  Volume Overload -Has documented that she is 11.665 L positive -Will give her a dose of IV Lasix 40 mg today and repeat CXR in the AM -Strict I's and O's and Daily Weights;  Intake/Output Summary (Last 24 hours) at 08/10/2023 2101 Last data filed at 08/10/2023 1638 Gross per 24 hour  Intake 305 ml  Output 0 ml  Net 305 ml  -Continue to Monitor for S/Sx of Volume Overlade   Abnormal LFTs  -Elevated in setting of Abscess along the periphery of R Hepatic Lobe -AST/ALT Trend: Recent Labs  Lab 08/04/23 0009 08/05/23 0253 08/06/23 0231 08/07/23 0227 08/08/23 0346 08/09/23 0301 08/10/23 0346  AST 49* 49* 44* 41 36 34 39  ALT 81* 85* 84* 85* 81* 74* 74*  -Continue to Monitor and Trend and repeat CMP in the AM and may need to limit the amount of Acetaminophen that she is using as it is 1000 mg every 8 hours   Hyperglycemia in the setting of Prediabetes -HbA1c 6.1%.  -Continue sliding scale and Accu-Cheks -CBG trend: Recent Labs  Lab 08/08/23 0529 08/08/23 1129 08/08/23 1756 08/08/23 2036 08/09/23 0421 08/09/23 1141 08/09/23 2001  GLUCAP 147* 154* 115* 127* 103* 106* 136*   Normocytic Anemia -Hgb/Hct Trend relatively stable in mid 8 to low 9 range: Recent Labs  Lab 08/04/23 0009 08/05/23 0253 08/06/23 0231 08/07/23 0227 08/08/23 0346 08/09/23 0301 08/10/23 0346  HGB 9.2* 9.3* 9.1* 8.9* 8.6* 8.6* 8.6*  HCT 26.8* 29.1* 28.7* 28.3* 27.2*  26.7* 27.5*  MCV 94.7 98.6 98.6 99.6 100.0 97.8 99.3  -Continue to monitor for signs or symptoms of bleeding; no overt bleeding noted -Repeat CBC in a.m.  Thrombocytosis -Likely reactive in the setting of above -Platelet Count Trend: Recent Labs  Lab 08/04/23 0009 08/05/23 0253 08/06/23 0231 08/07/23 0227 08/08/23 0346 08/09/23 0301 08/10/23 0346  PLT 457* 552* 627* 689* 746* 828* 856*  -Continue to Monitor and Trend and repeat CMP in the AM   Moderate Protein Calorie Malnutrition Nutrition Status: Nutrition Problem: Moderate Malnutrition Etiology: acute illness Signs/Symptoms: mild fat depletion, mild muscle depletion, energy intake < 75% for > 7 days Interventions: TPN  Hypoalbuminemia -Patient's Albumin Trend: Recent Labs  Lab 08/04/23 0009 08/05/23 0253 08/06/23 0231 08/07/23 0227 08/08/23 0346 08/09/23 0301 08/10/23 0346  ALBUMIN 2.2* 2.3* 2.3* 2.3* 2.2* 2.2* 2.2*  -Continue to Monitor and Trend and repeat CMP in the AM   DVT prophylaxis: enoxaparin (LOVENOX) injection 30 mg Start: 07/27/23 0800 SCD's Start: 07/26/23 2150    Code Status: Full Code Family Communication: Discussed with Daughter and other family members at bedside  Disposition Plan:  Level of care: Telemetry Status is: Inpatient Remains inpatient appropriate because:  Needs further clinical improvement and clearance by the specialists   Consultants:  Interventional Radiology General Surgery Infectious Diseases  Procedures:  As delineated as above LAPAROSCOPY DIAGNOSTIC CONVERTED TO OPEN SMALL BOWEL RESECTION   Antimicrobials:  Anti-infectives (From admission, onward)    Start     Dose/Rate Route Frequency Ordered Stop   08/10/23 2200  fluconazole (DIFLUCAN) tablet 400 mg        400 mg Oral Daily at bedtime 08/10/23 1203     08/10/23 1300  Ampicillin-Sulbactam (UNASYN) 3 g in sodium chloride 0.9 % 100 mL IVPB        3 g 200 mL/hr over 30 Minutes Intravenous Every 6 hours 08/10/23  1203     08/08/23 0000  micafungin (MYCAMINE) 100 mg in sodium chloride 0.9 % 100 mL IVPB  Status:  Discontinued        100 mg 105 mL/hr over 1 Hours Intravenous Every 24 hours 08/07/23 1549 08/10/23 1203   08/07/23 0000  micafungin (MYCAMINE) 150 mg in sodium chloride 0.9 % 100 mL IVPB  Status:  Discontinued        150 mg 107.5 mL/hr over 1 Hours Intravenous Every 24 hours 08/06/23 2301 08/07/23 1549   07/28/23 1330  piperacillin-tazobactam (ZOSYN) IVPB 3.375 g  Status:  Discontinued        3.375 g 12.5 mL/hr over 240 Minutes Intravenous Every 8 hours 07/28/23 1232 08/10/23 1203   07/26/23 1745  piperacillin-tazobactam (ZOSYN) IVPB 3.375 g        3.375 g 12.5 mL/hr over 240 Minutes Intravenous  Once 07/26/23 1737 07/26/23 1931       Subjective: Seen and examined at bedside and was doing okay but desaturated on ambulatory home O2 screen.  Daughter thinks that she has some more leg swelling.  Patient having bowel movements and passing flatus.  No nausea or vomiting.  No other concerns or complaints at this time.  Objective: Vitals:   08/10/23 0528 08/10/23 0634 08/10/23 1414 08/10/23 2019  BP: (!) 145/58  (!) 129/59 (!) 151/62  Pulse: 79  81 88  Resp:   17 18  Temp: 98.6 F (37 C)  98.5 F (36.9 C) 98.2 F (36.8 C)  TempSrc:   Oral Oral  SpO2: 100% 100% 98% 99%  Weight:      Height:        Intake/Output Summary (Last 24 hours) at 08/10/2023 2101 Last data filed at 08/10/2023 1638 Gross per 24 hour  Intake 305 ml  Output 0 ml  Net 305 ml   Filed Weights   08/04/23 0505 08/06/23 0453 08/07/23 0449  Weight: 72.3 kg 65.1 kg 65.6 kg   Examination: Physical Exam:  Constitutional: WN/WD overweight elderly Caucasian female no acute distress appears calm Respiratory: Diminished to auscultation bilaterally with some coarse breath sounds and some crackles, no wheezing, rales, rhonchi. Normal respiratory effort and patient is not tachypenic. No accessory muscle use.  Unlabored  breathing but is wearing 2 L of supplemental oxygen via nasal cannula Cardiovascular: RRR, no murmurs / rubs / gallops. S1 and S2 auscultated.  Has 1+ lower extremity edema Abdomen: Soft, non-tender, distended secondary to body habitus and has a wound VAC in place and drain in place.. Bowel sounds positive.  GU: Deferred. Musculoskeletal: No clubbing / cyanosis of digits/nails. No joint deformity upper and lower extremities.  Skin: No rashes, lesions, ulcers on limited skin evaluation. No induration; Warm and dry.  Neurologic: CN 2-12 grossly intact with no focal deficits. Romberg  sign and cerebellar reflexes not assessed.  Psychiatric: Normal judgment and insight. Alert and oriented x 3. Normal mood and appropriate affect.   Data Reviewed: I have personally reviewed following labs and imaging studies  CBC: Recent Labs  Lab 08/04/23 0009 08/05/23 0253 08/06/23 0231 08/07/23 0227 08/08/23 0346 08/09/23 0301 08/10/23 0346  WBC 26.5* 25.0* 24.8* 19.0* 17.0* 17.9* 18.3*  NEUTROABS 21.5* 20.3* 19.8*  --   --   --  13.0*  HGB 9.2* 9.3* 9.1* 8.9* 8.6* 8.6* 8.6*  HCT 26.8* 29.1* 28.7* 28.3* 27.2* 26.7* 27.5*  MCV 94.7 98.6 98.6 99.6 100.0 97.8 99.3  PLT 457* 552* 627* 689* 746* 828* 856*   Basic Metabolic Panel: Recent Labs  Lab 08/04/23 0009 08/05/23 0253 08/06/23 0231 08/07/23 0227 08/08/23 0346 08/09/23 0301 08/10/23 0346  NA 128* 130* 127* 134* 132* 133* 132*  K 3.7 3.3* 3.4* 3.9 3.8 3.9 3.8  CL 97* 100 97* 104 103 100 98  CO2 24 23 24 23 23 24 27   GLUCOSE 125* 143* 125* 151* 142* 98 101*  BUN 17 16 16 17 17 15 12   CREATININE 0.53 0.36* 0.50 0.59 0.47 0.56 0.57  CALCIUM 9.4 9.5 9.2 9.4 9.3 9.5 9.6  MG 2.3 2.5* 2.4 2.2 2.0 2.0 2.0  PHOS 2.8 3.7 2.5  --   --   --  4.0   GFR: Estimated Creatinine Clearance: 54.3 mL/min (by C-G formula based on SCr of 0.57 mg/dL). Liver Function Tests: Recent Labs  Lab 08/06/23 0231 08/07/23 0227 08/08/23 0346 08/09/23 0301  08/10/23 0346  AST 44* 41 36 34 39  ALT 84* 85* 81* 74* 74*  ALKPHOS 247* 235* 211* 202* 205*  BILITOT 0.5 0.5 0.6 0.4 0.4  PROT 6.1* 6.1* 6.3* 6.0* 5.9*  ALBUMIN 2.3* 2.3* 2.2* 2.2* 2.2*   No results for input(s): "LIPASE", "AMYLASE" in the last 168 hours. No results for input(s): "AMMONIA" in the last 168 hours. Coagulation Profile: Recent Labs  Lab 08/05/23 0253  INR 1.1   Cardiac Enzymes: No results for input(s): "CKTOTAL", "CKMB", "CKMBINDEX", "TROPONINI" in the last 168 hours. BNP (last 3 results) No results for input(s): "PROBNP" in the last 8760 hours. HbA1C: No results for input(s): "HGBA1C" in the last 72 hours. CBG: Recent Labs  Lab 08/08/23 1756 08/08/23 2036 08/09/23 0421 08/09/23 1141 08/09/23 2001  GLUCAP 115* 127* 103* 106* 136*   Lipid Profile: No results for input(s): "CHOL", "HDL", "LDLCALC", "TRIG", "CHOLHDL", "LDLDIRECT" in the last 72 hours. Thyroid Function Tests: No results for input(s): "TSH", "T4TOTAL", "FREET4", "T3FREE", "THYROIDAB" in the last 72 hours. Anemia Panel: No results for input(s): "VITAMINB12", "FOLATE", "FERRITIN", "TIBC", "IRON", "RETICCTPCT" in the last 72 hours. Sepsis Labs: No results for input(s): "PROCALCITON", "LATICACIDVEN" in the last 168 hours.  Recent Results (from the past 240 hours)  Culture, blood (Routine X 2) w Reflex to ID Panel     Status: None   Collection Time: 08/02/23  4:08 PM   Specimen: BLOOD LEFT ARM  Result Value Ref Range Status   Specimen Description   Final    BLOOD LEFT ARM Performed at [facility], [address] [place], [place] [address]    Special Requests   Final    BOTTLES DRAWN AEROBIC AND ANAEROBIC Blood Culture results may not be optimal due to an inadequate volume of blood received in culture bottles Performed at [facility], [address] [place], [place] [address]    Culture   Final  NO GROWTH 5 DAYS Performed at [facility], [address] [place], [place] [address]    Report Status 08/07/2023 FINAL  Final  Culture, blood (Routine X 2) w Reflex to ID Panel     Status: None   Collection Time: 08/02/23  4:16 PM   Specimen: BLOOD RIGHT HAND  Result Value Ref Range Status   Specimen Description   Final    BLOOD RIGHT HAND Performed at [facility], [address], [place], [place] [address]    Special Requests   Final    BOTTLES DRAWN AEROBIC AND ANAEROBIC Blood Culture results may not be optimal due to an inadequate volume of blood received in culture bottles Performed at [facility], [address] [place], [place] [address]    Culture   Final    NO GROWTH 5 DAYS Performed at [facility], [address], [place], [place] [address]    Report Status 08/07/2023 FINAL  Final  Aerobic/Anaerobic Culture w Gram Stain (surgical/deep wound)     Status: None (Preliminary result)   Collection Time: 08/06/23  1:49 PM   Specimen: Abscess  Result Value Ref Range Status   Specimen Description   Final    ABSCESS Performed at [facility], [address] [place], [place] [address]    Special Requests   Final    Normal Performed at [facility], [address], [place], [place] [address]    Gram Stain   Final    ABUNDANT WBC PRESENT, PREDOMINANTLY PMN RARE BUDDING YEAST SEEN Performed at [facility], [address], [place], [place] [address]    Culture   Final    RARE CANDIDA ALBICANS Sent to [facility] for further susceptibility testing. NO ANAEROBES ISOLATED; CULTURE IN PROGRESS FOR 5 DAYS    Report Status PENDING  Incomplete  Aerobic/Anaerobic Culture w Gram Stain (surgical/deep wound)     Status: None (Preliminary result)   Collection Time: 08/06/23  1:50 PM   Specimen: Abscess  Result Value Ref Range Status   Specimen Description   Final    ABSCESS Performed at [facility], [address], [place], [place] [address]     Special Requests   Final    Normal Performed at [facility], [address] [place], [place] [address]    Gram Stain   Final    FEW WBC PRESENT,BOTH PMN AND MONONUCLEAR NO ORGANISMS SEEN    Culture   Final    NO GROWTH 4 DAYS NO ANAEROBES ISOLATED; CULTURE IN PROGRESS FOR 5 DAYS Performed at [facility], [address] [place], [place] [address]    Report Status PENDING  Incomplete    Radiology Studies: No results found.  Scheduled Meds:  acetaminophen  1,000 mg Oral Q8H   Chlorhexidine Gluconate Cloth  6 each Topical Daily   cyclobenzaprine  10 mg Oral QHS   enoxaparin (LOVENOX) injection  30 mg Subcutaneous Q24H   escitalopram  20 mg Oral Daily   feeding supplement  237 mL Oral TID WC   fluconazole  400 mg Oral QHS   furosemide  40 mg Intravenous Once   Gerhardt's butt cream   Topical BID   lidocaine  2 patch Transdermal Q24H   lisinopril  10 mg Oral QHS   mometasone-formoterol  2 puff Inhalation BID   montelukast  10 mg Oral QHS   nicotine  21  mg Transdermal Daily   simvastatin  40 mg Oral Daily   sodium chloride flush  10-40 mL Intracatheter Q12H   sodium chloride flush  5 mL Intracatheter Q8H   Continuous Infusions:  ampicillin-sulbactam (UNASYN) IV 3 g (08/10/23 1821)    LOS: 15 days   Marguerita Merles, DO [facility] Available via Epic secure chat 7am-7pm After these hours, please refer to coverage provider listed on [email or web address] 08/10/2023, 9:01 PM

## 2023-08-10 NOTE — TOC Progression Note (Signed)
Transition of Care [facility]) - Progression Note    Patient Details  Name: Laura Bradshaw MRN: [id number] Date of Birth: 09/02/1950  Transition of Care [facility]) CM/SW Contact  Otelia Santee, LCSW Phone Number: 08/10/2023, 12:54 PM  Clinical Narrative:    Pt tentatively to be medically ready for discharge on Sunday or Monday.  [employer] wound vac to be delivered to pt's room today. POD form signed and emailed to [employer] with [employer].  RW ordered through [employer] to be delivered to pt's room.  Pt will be receiving HH services through [place].    Expected Discharge Plan: Home w Home Health Services Barriers to Discharge: Continued Medical Work up  Expected Discharge Plan and Services   Discharge Planning Services: CM Consult Post Acute Care Choice: Home Health Living arrangements for the past 2 months: Single Family Home                 DME Arranged: Vac DME Agency: [employer] Date DME Agency Contacted: 07/30/23 Time DME Agency Contacted: [phone] Representative spoke with at DME Agency: [facility] HH Arranged: PT, OT, RN HH Agency: [facility] Date [facility] Agency Contacted: 07/30/23 Time HH Agency Contacted: 1356 Representative spoke with at [facility] Agency: [facility]   Social Determinants of Health (SDOH) Interventions SDOH Screenings   Food Insecurity: No Food Insecurity (07/29/2023)  Housing: Low Risk  (07/29/2023)  Transportation Needs: No Transportation Needs (07/29/2023)  Utilities: Not At Risk (07/27/2023)  Financial Resource Strain: Low Risk  (06/28/2023)   Received from [facility]  Physical Activity: Unknown (06/28/2023)   Received from [facility]  Social Connections: Somewhat Isolated (06/28/2023)   Received from [facility]  Stress: Stress Concern Present (06/28/2023)   Received from [facility]  Tobacco Use: High Risk (08/04/2023)    Readmission Risk Interventions     No data to display

## 2023-08-10 NOTE — Plan of Care (Signed)

## 2023-08-10 NOTE — Progress Notes (Signed)
15 Days Post-Op   Subjective/Chief Complaint: Tolerating soft diet.  Eating well.  Gluteal drain removed 12/19.  Working well with therapies.  VAC not changed since Monday.  Objective: Vital signs in last 24 hours: Temp:  [98.2 F (36.8 C)-98.6 F (37 C)] 98.6 F (37 C) (12/20 0528) Pulse Rate:  [74-84] 79 (12/20 0528) Resp:  [17-20] 20 (12/19 1951) BP: (128-145)/(58-72) 145/58 (12/20 0528) SpO2:  [97 %-100 %] 100 % (12/20 0634) Last BM Date : 08/09/23  Intake/Output from previous day: 12/19 0701 - 12/20 0700 In: 200 [P.O.:200] Out: 0  Intake/Output this shift: No intake/output data recorded.  Alert, NAD Resp: normal respiratory effort Abd: soft, appropraitely tender, vac in place holding suction, laparoscopic incisions c/d/I without cellulitis. Right IR drain w/ purulent fluid (20 mL)   Lab Results:  Recent Labs    08/09/23 0301 08/10/23 0346  WBC 17.9* 18.3*  HGB 8.6* 8.6*  HCT 26.7* 27.5*  PLT 828* 856*   BMET Recent Labs    08/09/23 0301 08/10/23 0346  NA 133* 132*  K 3.9 3.8  CL 100 98  CO2 24 27  GLUCOSE 98 101*  BUN 15 12  CREATININE 0.56 0.57  CALCIUM 9.5 9.6   PT/INR No results for input(s): "LABPROT", "INR" in the last 72 hours.  ABG No results for input(s): "PHART", "HCO3" in the last 72 hours.  Invalid input(s): "PCO2", "PO2"  Studies/Results: No results found.    Anti-infectives: Anti-infectives (From admission, onward)    Start     Dose/Rate Route Frequency Ordered Stop   08/08/23 0000  micafungin (MYCAMINE) 100 mg in sodium chloride 0.9 % 100 mL IVPB        100 mg 105 mL/hr over 1 Hours Intravenous Every 24 hours 08/07/23 1549     08/07/23 0000  micafungin (MYCAMINE) 150 mg in sodium chloride 0.9 % 100 mL IVPB  Status:  Discontinued        150 mg 107.5 mL/hr over 1 Hours Intravenous Every 24 hours 08/06/23 2301 08/07/23 1549   07/28/23 1330  piperacillin-tazobactam (ZOSYN) IVPB 3.375 g        3.375 g 12.5 mL/hr over 240  Minutes Intravenous Every 8 hours 07/28/23 1232     07/26/23 1745  piperacillin-tazobactam (ZOSYN) IVPB 3.375 g        3.375 g 12.5 mL/hr over 240 Minutes Intravenous  Once 07/26/23 1737 07/26/23 1931       Assessment/Plan: Closed loop SBO with ischemic small bowel and perforation  POD 15, s/p laparoscopy converted to open small bowel resection Dr. Maisie Fus - post-op ileus resolved - s/p IR drain x2 on 12/16; culture with Candida, abx per ID, TG drain removed on 12/19 as culture was negative and output serous - TNA off - VAC M/W/F, will reach out to [facility].  Daughter at bedside states last VAC change was Monday. - continue to mobilize as tolerated - continue tylenol, toradol, lidoderm for pain  -will reach out to [facility] regarding HH and VAC at home as she is approaching surgical readiness for discharge.   Severe protein calorie malnutrition - eating well   FEN: Soft  VTE: LMWH ID: Zosyn 12/5>>, micafungin 12/17 >> started by ID for budding yeast on cultures   Letha Cape 08/10/2023

## 2023-08-10 NOTE — Discharge Instructions (Signed)

## 2023-08-10 NOTE — Progress Notes (Addendum)
Subjective:  No new complaints   Antibiotics:  Anti-infectives (From admission, onward)    Start     Dose/Rate Route Frequency Ordered Stop   08/10/23 2200  fluconazole (DIFLUCAN) tablet 400 mg        400 mg Oral Daily at bedtime 08/10/23 1203     08/10/23 1300  Ampicillin-Sulbactam (UNASYN) 3 g in sodium chloride 0.9 % 100 mL IVPB        3 g 200 mL/hr over 30 Minutes Intravenous Every 6 hours 08/10/23 1203     08/08/23 0000  micafungin (MYCAMINE) 100 mg in sodium chloride 0.9 % 100 mL IVPB  Status:  Discontinued        100 mg 105 mL/hr over 1 Hours Intravenous Every 24 hours 08/07/23 1549 08/10/23 1203   08/07/23 0000  micafungin (MYCAMINE) 150 mg in sodium chloride 0.9 % 100 mL IVPB  Status:  Discontinued        150 mg 107.5 mL/hr over 1 Hours Intravenous Every 24 hours 08/06/23 2301 08/07/23 1549   07/28/23 1330  piperacillin-tazobactam (ZOSYN) IVPB 3.375 g  Status:  Discontinued        3.375 g 12.5 mL/hr over 240 Minutes Intravenous Every 8 hours 07/28/23 1232 08/10/23 1203   07/26/23 1745  piperacillin-tazobactam (ZOSYN) IVPB 3.375 g        3.375 g 12.5 mL/hr over 240 Minutes Intravenous  Once 07/26/23 1737 07/26/23 1931       Medications: Scheduled Meds:  acetaminophen  1,000 mg Oral Q8H   Chlorhexidine Gluconate Cloth  6 each Topical Daily   cyclobenzaprine  10 mg Oral QHS   enoxaparin (LOVENOX) injection  30 mg Subcutaneous Q24H   escitalopram  20 mg Oral Daily   feeding supplement  237 mL Oral TID WC   fluconazole  400 mg Oral QHS   Gerhardt's butt cream   Topical BID   lidocaine  2 patch Transdermal Q24H   lisinopril  10 mg Oral QHS   mometasone-formoterol  2 puff Inhalation BID   montelukast  10 mg Oral QHS   nicotine  21 mg Transdermal Daily   simvastatin  40 mg Oral Daily   sodium chloride flush  10-40 mL Intracatheter Q12H   sodium chloride flush  5 mL Intracatheter Q8H   Continuous Infusions:  ampicillin-sulbactam (UNASYN) IV Stopped  (08/10/23 1340)   PRN Meds:.albuterol, fluticasone, guaiFENesin-dextromethorphan, hydrALAZINE, ketorolac, LORazepam, methocarbamol (ROBAXIN) injection, metoprolol tartrate, morphine injection, ondansetron **OR** ondansetron (ZOFRAN) IV, oxyCODONE, simethicone, sodium chloride flush    Objective: Weight change:   Intake/Output Summary (Last 24 hours) at 08/10/2023 1746 Last data filed at 08/10/2023 1638 Gross per 24 hour  Intake 305 ml  Output 0 ml  Net 305 ml   Blood pressure (!) 129/59, pulse 81, temperature 98.5 F (36.9 C), temperature source Oral, resp. rate 17, height 5\' 1"  (1.549 m), weight 65.6 kg, SpO2 98%. Temp:  [98.3 F (36.8 C)-98.6 F (37 C)] 98.5 F (36.9 C) (12/20 1414) Pulse Rate:  [79-84] 81 (12/20 1414) Resp:  [17-20] 17 (12/20 1414) BP: (128-145)/(58-72) 129/59 (12/20 1414) SpO2:  [97 %-100 %] 98 % (12/20 1414)  Physical Exam: Physical Exam Constitutional:      General: She is not in acute distress.    Appearance: She is well-developed. She is not diaphoretic.  HENT:     Head: Normocephalic and atraumatic.     Right Ear: External ear normal.     Left Ear: External  ear normal.     Mouth/Throat:     Pharynx: No oropharyngeal exudate.  Eyes:     General: No scleral icterus.    Conjunctiva/sclera: Conjunctivae normal.     Pupils: Pupils are equal, round, and reactive to light.  Cardiovascular:     Rate and Rhythm: Normal rate and regular rhythm.  Pulmonary:     Effort: Pulmonary effort is normal. No respiratory distress.     Breath sounds: No wheezing.  Abdominal:     General: There is no distension.     Palpations: Abdomen is soft.  Musculoskeletal:        General: No tenderness. Normal range of motion.  Lymphadenopathy:     Cervical: No cervical adenopathy.  Skin:    General: Skin is warm and dry.     Coloration: Skin is not pale.     Findings: No erythema or rash.  Neurological:     General: No focal deficit present.     Mental Status: She  is alert and oriented to person, place, and time.     Motor: No abnormal muscle tone.     Coordination: Coordination normal.  Psychiatric:        Mood and Affect: Mood normal.        Behavior: Behavior normal.        Thought Content: Thought content normal.        Judgment: Judgment normal.      Surgical site    Frank pus though less than yesterday in JP drain on the right,  CBC:    BMET Recent Labs    08/09/23 0301 08/10/23 0346  NA 133* 132*  K 3.9 3.8  CL 100 98  CO2 24 27  GLUCOSE 98 101*  BUN 15 12  CREATININE 0.56 0.57  CALCIUM 9.5 9.6     Liver Panel  Recent Labs    08/09/23 0301 08/10/23 0346  PROT 6.0* 5.9*  ALBUMIN 2.2* 2.2*  AST 34 39  ALT 74* 74*  ALKPHOS 202* 205*  BILITOT 0.4 0.4       Sedimentation Rate No results for input(s): "ESRSEDRATE" in the last 72 hours. C-Reactive Protein No results for input(s): "CRP" in the last 72 hours.  Micro Results: Recent Results (from the past 720 hours)  Blood culture (routine x 2)     Status: None   Collection Time: 07/26/23  9:16 PM   Specimen: BLOOD  Result Value Ref Range Status   Specimen Description   Final    BLOOD BLOOD RIGHT HAND Performed at [facility], [address], [place], [place] [address]    Special Requests   Final    BOTTLES DRAWN AEROBIC AND ANAEROBIC Blood Culture results may not be optimal due to an inadequate volume of blood received in culture bottles Performed at [facility], [address] [place], [place] [address]    Culture   Final    NO GROWTH 5 DAYS Performed at [facility], [address], [place], [place] [address]    Report Status 07/31/2023 FINAL  Final  Blood culture (routine x 2)     Status: None   Collection Time: 07/26/23  9:16 PM   Specimen: BLOOD  Result Value Ref Range Status   Specimen Description   Final    BLOOD RIGHT ANTECUBITAL Performed at [facility], [address], [place], [place] [address]    Special Requests   Final  BOTTLES DRAWN AEROBIC AND ANAEROBIC Blood Culture adequate volume Performed at [facility], [address] [place], [place] [address]    Culture   Final    NO GROWTH 5 DAYS Performed at [facility], [address], [place], [place] [address]    Report Status 07/31/2023 FINAL  Final  Culture, blood (Routine X 2) w Reflex to ID Panel     Status: None   Collection Time: 08/02/23  4:08 PM   Specimen: BLOOD LEFT ARM  Result Value Ref Range Status   Specimen Description   Final    BLOOD LEFT ARM Performed at [facility], [address], [place], [place] [address]    Special Requests   Final    BOTTLES DRAWN AEROBIC AND ANAEROBIC Blood Culture results may not be optimal due to an inadequate volume of blood received in culture bottles Performed at [facility], [address], [place], [place] [address]    Culture   Final    NO GROWTH 5 DAYS Performed at [facility], [address], [place], [place] [address]    Report Status 08/07/2023 FINAL  Final  Culture, blood (Routine X 2) w Reflex to ID Panel     Status: None   Collection Time: 08/02/23  4:16 PM   Specimen: BLOOD RIGHT HAND  Result Value Ref Range Status   Specimen Description   Final    BLOOD RIGHT HAND Performed at [facility], [address], [place], [place] [address]    Special Requests   Final    BOTTLES DRAWN AEROBIC AND ANAEROBIC Blood Culture results may not be optimal due to an inadequate volume of blood received in culture bottles Performed at [facility], [address], [place], [place] [address]    Culture   Final    NO GROWTH 5 DAYS Performed at [facility], [address] [place], [place] [address]    Report Status 08/07/2023 FINAL  Final  Aerobic/Anaerobic Culture w Gram Stain (surgical/deep wound)     Status: None (Preliminary result)   Collection Time:  08/06/23  1:49 PM   Specimen: Abscess  Result Value Ref Range Status   Specimen Description   Final    ABSCESS Performed at [facility], [address], [place], [place] [address]    Special Requests   Final    Normal Performed at [facility], [address] [place], [place] [address]    Gram Stain   Final    ABUNDANT WBC PRESENT, PREDOMINANTLY PMN RARE BUDDING YEAST SEEN Performed at [facility], [address], [place], [place] [address]    Culture   Final    RARE CANDIDA ALBICANS Sent to [facility] for further susceptibility testing. NO ANAEROBES ISOLATED; CULTURE IN PROGRESS FOR 5 DAYS    Report Status PENDING  Incomplete  Aerobic/Anaerobic Culture w Gram Stain (surgical/deep wound)     Status: None (Preliminary result)   Collection Time: 08/06/23  1:50 PM   Specimen: Abscess  Result Value Ref Range Status   Specimen Description   Final    ABSCESS Performed at [facility], [address] [place], [place] [address]    Special Requests   Final    Normal Performed at [facility], [address], [place], [place] [address]    Gram Stain   Final    FEW WBC  PRESENT,BOTH PMN AND MONONUCLEAR NO ORGANISMS SEEN    Culture   Final    NO GROWTH 4 DAYS NO ANAEROBES ISOLATED; CULTURE IN PROGRESS FOR 5 DAYS Performed at [facility], [address] [place], [place] [address]    Report Status PENDING  Incomplete    Studies/Results: No results found.     Assessment/Plan:  INTERVAL HISTORY: Cultures have yielded Candida albicans  Principal Problem:   Intra-abdominal abscess (HCC) Active Problems:   Small bowel obstruction (HCC)   Malnutrition of moderate degree    Laura Bradshaw is a 72 y.o. female with Mall bowel obstruction status post resection, complicated now by intra-abdominal abscesses  #1 Intrabdominal abscesses:  Sp IR guided drainage with 2 JP drains, 1 of which  is removed Culture has yielded Candida albicans but no bacteria we added micafungin when a yeast was first seen to the patient's Zosyn  We will change the patient over to fluconazole given the fact that most Candida albicans species are sensitive to azoles and this would be a nice oral antibiotic to send her home on.  We are also changing her from Zosyn to Unasyn since if she does well on Unasyn we can transition her to the oral equivalent which would be Augmentin.  She should be treated until intra-abdominal abscesses have resolved altogether.    #2 SBO sp surgery. Also on TPN via dual lumen PICC   Jina S Altschuler has an appointment on 08/29/2023 at 10 AM with Dr. Thedore Mins  at  [facility], which  is located in the [facility] at  [address] in [place].  [address], which is located to the left of the elevators.  Phone: [phone]  Fax: [phone]  [email or web address]  The patient should arrive 30 minutes prior to their appoitment.   I am available for questions this weekend and will otherwise follow-up with the patient on Monday.   LOS: 15 days   Acey Lav 08/10/2023, 5:46 PM

## 2023-08-10 NOTE — Consult Note (Signed)
WOC Nurse Consult Note: patient had SB resection 07/26/2023 by Dr. Maisie Fus; subsequently had NPWT placed 12/9, changed last by bedside nurse 08/06/2023 WOC consulted for placement going forward Monday/Thursday  Reason for Consult: change NPWT  Wound type: full thickness surgical as above  Pressure Injury POA: NA  Measurement:7 cm x 3 cm x 1.5 cm  Wound bed: 100% pale pink  Drainage (amount, consistency, odor) minimal serosanguinous present at this time, milky in cannister  Periwound:some maceration noted around wound bed, attempted to cut sponge smaller and used barrier ring at top portion to fill in umbilicus and keep sponge off intact skin  Dressing procedure/placement/frequency: Removed old NPWT dressing Cleansed wound with normal saline Cut a 2" barrier ring in half and placed at top of wound to cover umbilicus (see above)  Filled wound with 2 pieces of black foam  Sealed NPWT dressing at HG Patient received IV Toradol per bedside nurse prior to dressing change and tolerated well    WOC nurse will continue to provide NPWT dressing changes due to the complexity of the dressing change; plan to change Monday/Thursday; next change planned for 08/13/2023.  1 Medium Vac kit ordered for room.     Thank you,    Priscella Mann MSN, RN-BC, [employer] [phone]

## 2023-08-10 NOTE — Progress Notes (Signed)
Referring Physician(s): Darnelle Spangle  Supervising Physician: Simonne Come  Patient Status:  [facility] - In-pt  Chief Complaint:  Abdominal/pelvic fluid collections   Subjective: Patient without new complaints; has had BM today; family in room   Allergies: Ciprofloxacin, Codeine, Sulfonamide derivatives, and Hydrocodone  Medications: Prior to Admission medications   Medication Sig Start Date End Date Taking? Authorizing Provider  acetaminophen (TYLENOL) 500 MG tablet Take 1,000 mg by mouth as needed for moderate pain (pain score 4-6).   Yes [provider]  albuterol (VENTOLIN HFA) 108 (90 Base) MCG/ACT inhaler Inhale 2 puffs into the lungs every 4 (four) hours as needed for wheezing or shortness of breath. 07/25/23  Yes [provider]  aspirin 325 MG tablet Take 325 mg by mouth daily.   Yes [provider]  cyclobenzaprine (FLEXERIL) 10 MG tablet Take 10 mg by mouth at bedtime. 06/04/23  Yes [provider]  diclofenac (VOLTAREN) 75 MG EC tablet Take 75 mg by mouth 3 (three) times daily.   Yes [provider]  escitalopram (LEXAPRO) 20 MG tablet Take 20 mg by mouth daily.   Yes [provider]  fluticasone (FLONASE) 50 MCG/ACT nasal spray Place 2 sprays into both nostrils as needed for allergies. 07/25/23  Yes [provider]  fluticasone-salmeterol (ADVAIR) 250-50 MCG/ACT AEPB Inhale 2 puffs into the lungs as needed (wheezing/SOB). 07/25/23  Yes [provider]  gabapentin (NEURONTIN) 300 MG capsule Take 300 mg by mouth 3 (three) times daily.   Yes [provider]  hydrochlorothiazide (HYDRODIURIL) 25 MG tablet Take 25 mg by mouth daily.   Yes [provider]  lisinopril (ZESTRIL) 10 MG tablet Take 10 mg by mouth at bedtime.   Yes [provider]  methocarbamol (ROBAXIN) 500 MG tablet Take 1 tablet (500 mg total) by mouth every 8 (eight) hours as needed for muscle spasms. 08/10/23  Yes  Barnetta Chapel, PA-C  montelukast (SINGULAIR) 10 MG tablet Take 10 mg by mouth at bedtime.   Yes [provider]  simvastatin (ZOCOR) 40 MG tablet Take 40 mg by mouth daily.   Yes [provider]  Sodium Citrate Dihydrate (EMETROL) 230 MG CHEW Chew 460 mg by mouth as needed (nausea).   Yes [provider]  ALPRAZolam Prudy Feeler) 0.5 MG tablet Take 0.5 mg by mouth in the morning and at bedtime.    [provider]  naproxen (NAPROSYN) 500 MG tablet Take 500 mg by mouth 2 (two) times daily with a meal. Patient not taking: Reported on 07/27/2023    [provider]  oxyCODONE (OXY IR/ROXICODONE) 5 MG immediate release tablet Take 1 tablet (5 mg total) by mouth every 4 (four) hours as needed (pain). 08/10/23   Barnetta Chapel, PA-C     Vital Signs: BP (!) 129/59 (BP Location: Left Arm)   Pulse 81   Temp 98.5 F (36.9 C) (Oral)   Resp 17   Ht 5\' 1"  (1.549 m)   Wt 144 lb 10 oz (65.6 kg)   SpO2 98%   BMI 27.33 kg/m   Physical Exam awake, alert.  Right lower quadrant drain intact, insertion site okay, not significantly tender.  Output  about 10 cc purulent beige-colored fluid in JP bulb.  Drain flushed with return of purulent beige fluid.  Imaging: No results found.  Labs:  CBC: Recent Labs    08/07/23 0227 08/08/23 0346 08/09/23 0301 08/10/23 0346  WBC 19.0* 17.0* 17.9* 18.3*  HGB 8.9* 8.6* 8.6* 8.6*  HCT 28.3* 27.2* 26.7* 27.5*  PLT 689* 746* 828* 856*    COAGS: Recent Labs    08/05/23 0253  INR 1.1    BMP: Recent Labs    08/07/23 0227 08/08/23 0346 08/09/23 0301 08/10/23 0346  NA 134* 132* 133* 132*  K 3.9 3.8 3.9 3.8  CL 104 103 100 98  CO2 23 23 24 27   GLUCOSE 151* 142* 98 101*  BUN 17 17 15 12   CALCIUM 9.4 9.3 9.5 9.6  CREATININE 0.59 0.47 0.56 0.57  GFRNONAA >60 >60 >60 >60    LIVER FUNCTION TESTS: Recent Labs    08/07/23 0227 08/08/23 0346 08/09/23 0301 08/10/23 0346  BILITOT 0.5 0.6 0.4 0.4  AST 41 36 34  39  ALT 85* 81* 74* 74*  ALKPHOS 235* 211* 202* 205*  PROT 6.1* 6.3* 6.0* 5.9*  ALBUMIN 2.3* 2.2* 2.2* 2.2*    Assessment and Plan: 72 yo female with history of loop small bowel obstruction with ischemic small bowel perforation, status post laparoscopy converted to open small bowel resection on 12/5; status post drainage of post op perihepatic and left pelvic abscesses on 12/16; afebrile, WBC 18.3(17.9), hgb stable, creat nl; left transgluteal drain removed by CCS team on 12/19  Output by Drain (mL) 08/08/23 0701 - 08/08/23 1900 08/08/23 1901 - 08/09/23 0700 08/09/23 0701 - 08/09/23 1900 08/09/23 1901 - 08/10/23 0700 08/10/23 0701 - 08/10/23 1712  Closed System Drain 1 RUQ Bulb (JP) 10 Fr.  10     Negative Pressure Wound Therapy Abdomen Medial  0  0    Continue current treatment, drain irrigation, close output follow-up, lab checks; obtain follow-up CT once drain output minimal or if WBC abruptly increases; will likely need drain injection prior to removal.  Other plans as per CCS/TRH/ID    Electronically Signed: D. Jeananne Rama, PA-C 08/10/2023, 5:08 PM   I spent a total of 15 Minutes at the the patient's bedside AND on the patient's hospital floor or unit, greater than 50% of which was counseling/coordinating care for right abdominal abscess drain    Patient ID: Laura Bradshaw, female   DOB: 01-Jul-1951, 72 y.o.   MRN: [id number]

## 2023-08-11 ENCOUNTER — Inpatient Hospital Stay (HOSPITAL_COMMUNITY): Payer: Medicare PPO

## 2023-08-11 DIAGNOSIS — K651 Peritoneal abscess: Secondary | ICD-10-CM | POA: Diagnosis not present

## 2023-08-11 DIAGNOSIS — E440 Moderate protein-calorie malnutrition: Secondary | ICD-10-CM | POA: Diagnosis not present

## 2023-08-11 DIAGNOSIS — K56609 Unspecified intestinal obstruction, unspecified as to partial versus complete obstruction: Secondary | ICD-10-CM | POA: Diagnosis not present

## 2023-08-11 LAB — COMPREHENSIVE METABOLIC PANEL
ALT: 74 U/L — ABNORMAL HIGH (ref 0–44)
AST: 35 U/L (ref 15–41)
Albumin: 2.4 g/dL — ABNORMAL LOW (ref 3.5–5.0)
Alkaline Phosphatase: 199 U/L — ABNORMAL HIGH (ref 38–126)
Anion gap: 8 (ref 5–15)
BUN: 15 mg/dL (ref 8–23)
CO2: 29 mmol/L (ref 22–32)
Calcium: 10.1 mg/dL (ref 8.9–10.3)
Chloride: 98 mmol/L (ref 98–111)
Creatinine, Ser: 0.65 mg/dL (ref 0.44–1.00)
GFR, Estimated: >60 mL/min (ref >60)
Glucose, Bld: 98 mg/dL (ref 70–99)
Potassium: 3.6 mmol/L (ref 3.5–5.1)
Sodium: 135 mmol/L (ref 135–145)
Total Bilirubin: 0.4 mg/dL (ref <1.2)
Total Protein: 6.6 g/dL (ref 6.5–8.1)

## 2023-08-11 LAB — MAGNESIUM: Magnesium: 2.1 mg/dL (ref 1.7–2.4)

## 2023-08-11 LAB — CBC WITH DIFFERENTIAL/PLATELET
Abs Immature Granulocytes: 0.21 10*3/uL — ABNORMAL HIGH (ref 0.00–0.07)
Basophils Absolute: 0.1 10*3/uL (ref 0.0–0.1)
Basophils Relative: 1 %
Eosinophils Absolute: 0.5 10*3/uL (ref 0.0–0.5)
Eosinophils Relative: 3 %
HCT: 27.8 % — ABNORMAL LOW (ref 36.0–46.0)
Hemoglobin: 9.1 g/dL — ABNORMAL LOW (ref 12.0–15.0)
Immature Granulocytes: 1 %
Lymphocytes Relative: 16 %
Lymphs Abs: 2.9 10*3/uL (ref 0.7–4.0)
MCH: 31.7 pg (ref 26.0–34.0)
MCHC: 32.7 g/dL (ref 30.0–36.0)
MCV: 96.9 fL (ref 80.0–100.0)
Monocytes Absolute: 1.6 10*3/uL — ABNORMAL HIGH (ref 0.1–1.0)
Monocytes Relative: 9 %
Neutro Abs: 12.6 10*3/uL — ABNORMAL HIGH (ref 1.7–7.7)
Neutrophils Relative %: 70 %
Platelets: 873 10*3/uL — ABNORMAL HIGH (ref 150–400)
RBC: 2.87 MIL/uL — ABNORMAL LOW (ref 3.87–5.11)
RDW: 13.8 % (ref 11.5–15.5)
WBC: 17.9 10*3/uL — ABNORMAL HIGH (ref 4.0–10.5)
nRBC: 0 % (ref 0.0–0.2)

## 2023-08-11 LAB — AEROBIC/ANAEROBIC CULTURE W GRAM STAIN (SURGICAL/DEEP WOUND)
Culture: NO GROWTH
Special Requests: NORMAL

## 2023-08-11 LAB — GLUCOSE, CAPILLARY
Glucose-Capillary: 110 mg/dL — ABNORMAL HIGH (ref 70–99)
Glucose-Capillary: 139 mg/dL — ABNORMAL HIGH (ref 70–99)

## 2023-08-11 LAB — PHOSPHORUS: Phosphorus: 4.1 mg/dL (ref 2.5–4.6)

## 2023-08-11 MED ORDER — FUROSEMIDE 10 MG/ML IJ SOLN
40.0000 mg | Freq: Once | INTRAMUSCULAR | Status: AC
Start: 2023-08-11 — End: 2023-08-11
  Administered 2023-08-11: 40 mg via INTRAVENOUS
  Filled 2023-08-11: qty 4

## 2023-08-11 MED ORDER — KETOROLAC TROMETHAMINE 15 MG/ML IJ SOLN
15.0000 mg | Freq: Four times a day (QID) | INTRAMUSCULAR | Status: DC | PRN
  Administered 2023-08-11 (×2): 15 mg via INTRAVENOUS
  Filled 2023-08-11 (×2): qty 1

## 2023-08-11 NOTE — Progress Notes (Signed)
PROGRESS NOTE    Laura Bradshaw  VHQ:[id number] DOB: May 08, 1951 DOA: 07/26/2023 PCP: Alberteen Sam, FNP   Brief Narrative:  The patient is a 40 with history of HTN, anxiety, depression, COPD, HSV, low back pain, mild dementia comes to the hospital with acute abdominal pain and vomiting.  She was found to have closed-loop obstruction with ischemia.  Taken to the OR for laparoscopic which was eventually converted to open requiring small bowel resection due to perforation on 07/26/23 by Dr. Maisie Fus.   Postop developed ileus requiring NG tube later repeat scan showed intra-abdominal abscess therefore IR consulted.  ID has been following and patient has been on broad-spectrum IV antibiotics.  Patient underwent CT-guided right abdominal/pelvic asp/drainage on 08/06/23.  Her p.o. intake continues to improve and TPN has been weaned off.  Her left pelvic/transgluteal drain is removed today and cultures are also growing rare Candida albicans so ID added micafungin for the abscess culture but are hoping that is fluconazole sensitive.    ID has now changed her from the Zosyn and micafungin to Unasyn and fluconazole she did desaturate on home ambulatory O2 screen the day before yesterday and PT OT recommending discharging back home when she is medically stable.  She is now subsequently weaned off of oxygen and was given a dose of diuresis yesterday and will be given another 1 today.  Will need to continue to monitor volume status and anticipating discharging in next 24 hours if ID clears her and she is transition to oral Augmentin.  Assessment & Plan:  Principal Problem:   Intra-abdominal abscess (HCC) Active Problems:   Small bowel obstruction (HCC)   Malnutrition of moderate degree   Small bowel obstruction, perforation with ischemia requiring open resection and is postoperative day 16 Postop complicated by ileus and intra-abdominal abscesses -Initial surgery performed on 12/5.  Postop developed  ileus requiring NG tube which has now been removed.   -Repeat scan shows concerns of abscess therefore IR performed CT-guided drainage of the right abdominal/pelvic abscess and 2 drains were placed.  Currently one of them has been removed -WBC Trend: Recent Labs  Lab 08/05/23 0253 08/06/23 0231 08/07/23 0227 08/08/23 0346 08/09/23 0301 08/10/23 0346 08/11/23 0306  WBC 25.0* 24.8* 19.0* 17.0* 17.9* 18.3* 17.9*  -Follow cultures, tailor antibiotics.  Advised out of bed to chair -Was on empiric Zosyn, ID following have added Micafungin; now infectious disease has changed her to Unasyn and changing to fluconazole given the fact that most Candida albicans species are sensitive to the azoles: Per ID if she does well on Unasyn and the regular transition to oral equivalent of Augmentin and this can likely be done tomorrow 08/12/2023 -TPN was weaned and discontinued -Continue with pain control per general surgery recommendations and she is on cyclobenzaprine 10 mg p.o. nightly, lidocaine 2 patches transdermally q. 12, ketorolac 50 mg IV every 8 as needed for moderate pain as well as methocarbamol as well as IV morphine 1 mg every 4 as needed severe pain -Patient has an infectious disease appointment on 08/29/2023 with Dr. Thedore Mins at 10 AM -IR recommending continuing current treatment and drain irrigation and recommending follow-up CT 1 drainage output is minimal or if WBC abruptly increases and recommends that she will need drain irrigation prior to removal -WOC nurse changed her wound VAC on 08/10/2023 -Surgery has signed off the patient's case now and home health and wound VAC are being arranged given that she is approaching surgical readiness for discharge   Electrolyte  Abnormalities including Hypokalemia Hyponatremia Hypophosphatemia Hypercalcemia, resolved Recent Labs  Lab 08/05/23 0253 08/06/23 0231 08/07/23 0227 08/08/23 0346 08/09/23 0301 08/10/23 0346 08/11/23 0306  NA 130* 127* 134*  132* 133* 132* 135  K 3.3* 3.4* 3.9 3.8 3.9 3.8 3.6  MG 2.5* 2.4 2.2 2.0 2.0 2.0 2.1  PHOS 3.7 2.5  --   --   --  4.0 4.1  CALCIUM 9.5 9.2 9.4 9.3 9.5 9.6 10.1  -IVF now stopped and all electrolyte abnormalities have been resolved -Repeat CMP, mag Phos in the a.m.  Essential Hypertension -On Lisinopril 10 mg p.o. nightly -Continue with IV Hydralazine 10 mg every 4 as needed for systolic blood >180 -Continue to monitor blood pressures per protocol -Last blood pressure reading was 151/62   Anxiety and Depression -Stable and have resumed Escitalopram 20 mg p.o. daily   COPD -C/w As needed bronchodilators with Dulera, Singulair and as needed albuterol 3 mL IH Q4 as needed wheezing and shortness of breath SpO2: 97 % O2 Flow Rate (L/min): 2 L/min; was able to be weaned off of supplemental oxygen -Desaturated on ambulatory O2 screen the day before yesterday and will repeat again prior to discharge.  Will also give her another dose of Lasix due to to volume overload -Check chest x-ray in the a.m and repeat ambulatory home O2 screen prior to discharge    Leukocytosis, worsened in the setting of Intraabdominal Abscesses  -See Above  Volume Overload, improving  -Was documented that she is 12.540 L positive -Will give her another dose of IV Lasix 40 mg today and repeat CXR in the AM -Strict I's and O's and Daily Weights;  Intake/Output Summary (Last 24 hours) at 08/11/2023 1757 Last data filed at 08/10/2023 2100 Gross per 24 hour  Intake 5 ml  Output 30 ml  Net -25 ml  -Continue to Monitor for S/Sx of Volume Overload -Repeat chest x-ray this a.m. done and showed "The heart size and mediastinal contours are within normal limits. There is mild right basilar atelectasis. A trace pleural effusion is difficult to exclude. The left lung is clear. No pneumothorax. Degenerative changes are seen in the spine. A right upper extremity peripherally inserted central venous catheter tip overlies  the superior vena cava."   Abnormal LFTs  -Elevated in setting of Abscess along the periphery of R Hepatic Lobe -AST/ALT Trend: Recent Labs  Lab 08/05/23 0253 08/06/23 0231 08/07/23 0227 08/08/23 0346 08/09/23 0301 08/10/23 0346 08/11/23 0306  AST 49* 44* 41 36 34 39 35  ALT 85* 84* 85* 81* 74* 74* 74*  -Continue to Monitor and Trend and repeat CMP in the AM and may need to limit the amount of Acetaminophen that she is using as it is 1000 mg every 8 hours   Hyperglycemia in the setting of Prediabetes -HbA1c 6.1%.  -Continue sliding scale and Accu-Cheks -CBG trend: Recent Labs  Lab 08/08/23 1756 08/08/23 2036 08/09/23 0421 08/09/23 1141 08/09/23 2001 08/11/23 1218 08/11/23 1745  GLUCAP 115* 127* 103* 106* 136* 110* 139*   Normocytic Anemia -Hgb/Hct Trend relatively stable in mid 8 to low 9 range: Recent Labs  Lab 08/05/23 0253 08/06/23 0231 08/07/23 0227 08/08/23 0346 08/09/23 0301 08/10/23 0346 08/11/23 0306  HGB 9.3* 9.1* 8.9* 8.6* 8.6* 8.6* 9.1*  HCT 29.1* 28.7* 28.3* 27.2* 26.7* 27.5* 27.8*  MCV 98.6 98.6 99.6 100.0 97.8 99.3 96.9  -Continue to monitor for signs or symptoms of bleeding; no overt bleeding noted -Repeat CBC in a.m.  Thrombocytosis -Likely  reactive in the setting of above -Platelet Count Trend: Recent Labs  Lab 08/05/23 0253 08/06/23 0231 08/07/23 0227 08/08/23 0346 08/09/23 0301 08/10/23 0346 08/11/23 0306  PLT 552* 627* 689* 746* 828* 856* 873*  -Continue to Monitor and Trend and repeat CMP in the AM   Moderate Protein Calorie Malnutrition Nutrition Status: Nutrition Problem: Moderate Malnutrition Etiology: acute illness Signs/Symptoms: mild fat depletion, mild muscle depletion, energy intake < 75% for > 7 days Interventions: TPN  Hypoalbuminemia -Patient's Albumin Trend: Recent Labs  Lab 08/05/23 0253 08/06/23 0231 08/07/23 0227 08/08/23 0346 08/09/23 0301 08/10/23 0346 08/11/23 0306  ALBUMIN 2.3* 2.3* 2.3* 2.2*  2.2* 2.2* 2.4*  -Continue to Monitor and Trend and repeat CMP in the AM   DVT prophylaxis: enoxaparin (LOVENOX) injection 30 mg Start: 07/27/23 0800 SCD's Start: 07/26/23 2150    Code Status: Full Code Family Communication: Discussed with the patient's daughter and other family members at bedside  Disposition Plan:  Level of care: Telemetry Status is: Inpatient Remains inpatient appropriate because: His further clinical improvement and clearance by specialists including ID and general surgery.  Anticipating discharge in next 24 hours if able to be transition to Augmentin and respiratory status stable.   Consultants:  Interventional Radiology General Surgery Infectious Diseases  Procedures:  As delineated as above LAPAROSCOPY DIAGNOSTIC CONVERTED TO OPEN SMALL BOWEL RESECTION  Antimicrobials:  Anti-infectives (From admission, onward)    Start     Dose/Rate Route Frequency Ordered Stop   08/10/23 2200  fluconazole (DIFLUCAN) tablet 400 mg        400 mg Oral Daily at bedtime 08/10/23 1203     08/10/23 1300  Ampicillin-Sulbactam (UNASYN) 3 g in sodium chloride 0.9 % 100 mL IVPB        3 g 200 mL/hr over 30 Minutes Intravenous Every 6 hours 08/10/23 1203     08/08/23 0000  micafungin (MYCAMINE) 100 mg in sodium chloride 0.9 % 100 mL IVPB  Status:  Discontinued        100 mg 105 mL/hr over 1 Hours Intravenous Every 24 hours 08/07/23 1549 08/10/23 1203   08/07/23 0000  micafungin (MYCAMINE) 150 mg in sodium chloride 0.9 % 100 mL IVPB  Status:  Discontinued        150 mg 107.5 mL/hr over 1 Hours Intravenous Every 24 hours 08/06/23 2301 08/07/23 1549   07/28/23 1330  piperacillin-tazobactam (ZOSYN) IVPB 3.375 g  Status:  Discontinued        3.375 g 12.5 mL/hr over 240 Minutes Intravenous Every 8 hours 07/28/23 1232 08/10/23 1203   07/26/23 1745  piperacillin-tazobactam (ZOSYN) IVPB 3.375 g        3.375 g 12.5 mL/hr over 240 Minutes Intravenous  Once 07/26/23 1737 07/26/23 1931        Subjective: Seen and examined at bedside and she is sitting in the chair and getting her hair done by her daughter and feeling well.  Denied any complaints today.  Hard of hearing due to her hearing aids being charged.  Has been urinating quite frequently.  No other concerns or complaints at this time.  Objective: Vitals:   08/10/23 2019 08/11/23 0544 08/11/23 0822 08/11/23 1216  BP: (!) 151/62 (!) 167/65  127/69  Pulse: 88 72  86  Resp: 18 17  20   Temp: 98.2 F (36.8 C) 97.8 F (36.6 C)  97.8 F (36.6 C)  TempSrc: Oral Oral  Oral  SpO2: 99% 98% 98% 97%  Weight:  Height:        Intake/Output Summary (Last 24 hours) at 08/11/2023 1801 Last data filed at 08/10/2023 2100 Gross per 24 hour  Intake 5 ml  Output 30 ml  Net -25 ml   Filed Weights   08/04/23 0505 08/06/23 0453 08/07/23 0449  Weight: 72.3 kg 65.1 kg 65.6 kg   Examination: Physical Exam:  Constitutional: WN/WD overweight elderly Caucasian female sitting in the chair appears calm comfortable Respiratory: Diminished to auscultation bilaterally with some coarse breath sounds, no wheezing, rales, rhonchi or crackles. Normal respiratory effort and patient is not tachypenic. No accessory muscle use.  Has been weaned off of supplemental oxygen via nasal cannula Cardiovascular: RRR, no murmurs / rubs / gallops. S1 and S2 auscultated.  Mild 1+ extremity edema Abdomen: Soft, non-tender, distended secondary to body habitus and has a VAC in place and an abdominal drain.  Bowel sounds positive.  GU: Deferred. Musculoskeletal: No clubbing / cyanosis of digits/nails. No joint deformity upper and lower extremities.  Skin: No rashes, lesions, ulcers on a limited skin evaluation. No induration; Warm and dry.  Neurologic: CN 2-12 grossly intact with no focal deficits. Romberg sign and cerebellar reflexes not assessed.  Psychiatric: Normal judgment and insight. Alert and oriented x 3. Normal mood and appropriate affect.   Data  Reviewed: I have personally reviewed following labs and imaging studies  CBC: Recent Labs  Lab 08/05/23 0253 08/06/23 0231 08/07/23 0227 08/08/23 0346 08/09/23 0301 08/10/23 0346 08/11/23 0306  WBC 25.0* 24.8* 19.0* 17.0* 17.9* 18.3* 17.9*  NEUTROABS 20.3* 19.8*  --   --   --  13.0* 12.6*  HGB 9.3* 9.1* 8.9* 8.6* 8.6* 8.6* 9.1*  HCT 29.1* 28.7* 28.3* 27.2* 26.7* 27.5* 27.8*  MCV 98.6 98.6 99.6 100.0 97.8 99.3 96.9  PLT 552* 627* 689* 746* 828* 856* 873*   Basic Metabolic Panel: Recent Labs  Lab 08/05/23 0253 08/06/23 0231 08/07/23 0227 08/08/23 0346 08/09/23 0301 08/10/23 0346 08/11/23 0306  NA 130* 127* 134* 132* 133* 132* 135  K 3.3* 3.4* 3.9 3.8 3.9 3.8 3.6  CL 100 97* 104 103 100 98 98  CO2 23 24 23 23 24 27 29   GLUCOSE 143* 125* 151* 142* 98 101* 98  BUN 16 16 17 17 15 12 15   CREATININE 0.36* 0.50 0.59 0.47 0.56 0.57 0.65  CALCIUM 9.5 9.2 9.4 9.3 9.5 9.6 10.1  MG 2.5* 2.4 2.2 2.0 2.0 2.0 2.1  PHOS 3.7 2.5  --   --   --  4.0 4.1   GFR: Estimated Creatinine Clearance: 54.3 mL/min (by C-G formula based on SCr of 0.65 mg/dL). Liver Function Tests: Recent Labs  Lab 08/07/23 0227 08/08/23 0346 08/09/23 0301 08/10/23 0346 08/11/23 0306  AST 41 36 34 39 35  ALT 85* 81* 74* 74* 74*  ALKPHOS 235* 211* 202* 205* 199*  BILITOT 0.5 0.6 0.4 0.4 0.4  PROT 6.1* 6.3* 6.0* 5.9* 6.6  ALBUMIN 2.3* 2.2* 2.2* 2.2* 2.4*   No results for input(s): "LIPASE", "AMYLASE" in the last 168 hours. No results for input(s): "AMMONIA" in the last 168 hours. Coagulation Profile: Recent Labs  Lab 08/05/23 0253  INR 1.1   Cardiac Enzymes: No results for input(s): "CKTOTAL", "CKMB", "CKMBINDEX", "TROPONINI" in the last 168 hours. BNP (last 3 results) No results for input(s): "PROBNP" in the last 8760 hours. HbA1C: No results for input(s): "HGBA1C" in the last 72 hours. CBG: Recent Labs  Lab 08/09/23 0421 08/09/23 1141 08/09/23 2001 08/11/23 1218 08/11/23  1745  GLUCAP 103*  106* 136* 110* 139*   Lipid Profile: No results for input(s): "CHOL", "HDL", "LDLCALC", "TRIG", "CHOLHDL", "LDLDIRECT" in the last 72 hours. Thyroid Function Tests: No results for input(s): "TSH", "T4TOTAL", "FREET4", "T3FREE", "THYROIDAB" in the last 72 hours. Anemia Panel: No results for input(s): "VITAMINB12", "FOLATE", "FERRITIN", "TIBC", "IRON", "RETICCTPCT" in the last 72 hours. Sepsis Labs: No results for input(s): "PROCALCITON", "LATICACIDVEN" in the last 168 hours.  Recent Results (from the past 240 hours)  Culture, blood (Routine X 2) w Reflex to ID Panel     Status: None   Collection Time: 08/02/23  4:08 PM   Specimen: BLOOD LEFT ARM  Result Value Ref Range Status   Specimen Description   Final    BLOOD LEFT ARM Performed at [facility], [address] [place], [place] [address]    Special Requests   Final    BOTTLES DRAWN AEROBIC AND ANAEROBIC Blood Culture results may not be optimal due to an inadequate volume of blood received in culture bottles Performed at [facility], [address] [place], [place] [address]    Culture   Final    NO GROWTH 5 DAYS Performed at [facility], [address], [place], [place] [address]    Report Status 08/07/2023 FINAL  Final  Culture, blood (Routine X 2) w Reflex to ID Panel     Status: None   Collection Time: 08/02/23  4:16 PM   Specimen: BLOOD RIGHT HAND  Result Value Ref Range Status   Specimen Description   Final    BLOOD RIGHT HAND Performed at [facility], [address] [place], [place] [address]    Special Requests   Final    BOTTLES DRAWN AEROBIC AND ANAEROBIC Blood Culture results may not be optimal due to an inadequate volume of blood received in culture bottles Performed at [facility], [address], [place], [place] [address]    Culture   Final    NO GROWTH 5 DAYS Performed at [facility], [address] [place], [place] [address]     Report Status 08/07/2023 FINAL  Final  Aerobic/Anaerobic Culture w Gram Stain (surgical/deep wound)     Status: None (Preliminary result)   Collection Time: 08/06/23  1:49 PM   Specimen: Abscess  Result Value Ref Range Status   Specimen Description   Final    ABSCESS Performed at [facility], [address] [place], [place] [address]    Special Requests   Final    Normal Performed at [facility], [address], [place], [place] [address]    Gram Stain   Final    ABUNDANT WBC PRESENT, PREDOMINANTLY PMN RARE BUDDING YEAST SEEN    Culture   Final    RARE CANDIDA ALBICANS Sent to [facility] for further susceptibility testing. NO ANAEROBES ISOLATED Performed at [facility], [address] [place], [place] [address]    Report Status PENDING  Incomplete  Aerobic/Anaerobic Culture w Gram Stain (surgical/deep wound)     Status: None   Collection Time: 08/06/23  1:50 PM   Specimen: Abscess  Result Value Ref Range Status   Specimen Description   Final    ABSCESS Performed at [facility], [address] [place], [place] [address]    Special Requests   Final    Normal Performed at [facility], [address] [place], [place] [address]    Gram Stain   Final    FEW WBC PRESENT,BOTH PMN AND MONONUCLEAR NO ORGANISMS SEEN    Culture   Final    No growth aerobically or anaerobically. Performed at [facility], [address] [place], [place] [address]    Report Status 08/11/2023 FINAL  Final    Radiology Studies: DG CHEST PORT 1 VIEW Result Date: 08/11/2023 CLINICAL DATA:  Shortness of breath. EXAM: PORTABLE CHEST 1 VIEW COMPARISON:  Chest radiograph dated 08/03/2023. FINDINGS: The heart size and mediastinal contours are within normal limits. There is mild right basilar atelectasis. A trace pleural effusion is difficult to exclude. The left lung is clear. No pneumothorax. Degenerative changes  are seen in the spine. A right upper extremity peripherally inserted central venous catheter tip overlies the superior vena cava. IMPRESSION: Mild right basilar atelectasis. A trace pleural effusion is difficult to exclude. Electronically Signed   By: Romona Curls M.D.   On: 08/11/2023 10:12   Scheduled Meds:  acetaminophen  1,000 mg Oral Q8H   Chlorhexidine Gluconate Cloth  6 each Topical Daily   cyclobenzaprine  10 mg Oral QHS   enoxaparin (LOVENOX) injection  30 mg Subcutaneous Q24H   escitalopram  20 mg Oral Daily   feeding supplement  237 mL Oral TID WC   fluconazole  400 mg Oral QHS   Gerhardt's butt cream   Topical BID   lidocaine  2 patch Transdermal Q24H   lisinopril  10 mg Oral QHS   mometasone-formoterol  2 puff Inhalation BID   montelukast  10 mg Oral QHS   nicotine  21 mg Transdermal Daily   simvastatin  40 mg Oral Daily   sodium chloride flush  10-40 mL Intracatheter Q12H   sodium chloride flush  5 mL Intracatheter Q8H   Continuous Infusions:  ampicillin-sulbactam (UNASYN) IV 3 g (08/11/23 1313)    LOS: 16 days   Marguerita Merles, DO [facility] Available via Epic secure chat 7am-7pm After these hours, please refer to coverage provider listed on [email or web address] 08/11/2023, 6:01 PM

## 2023-08-11 NOTE — Plan of Care (Signed)

## 2023-08-11 NOTE — Progress Notes (Signed)
16 Days Post-Op   Subjective/Chief Complaint: Tolerating soft diet.  Eating well.  Really no complaints today  Objective: Vital signs in last 24 hours: Temp:  [97.8 F (36.6 C)-98.5 F (36.9 C)] 97.8 F (36.6 C) (12/21 0544) Pulse Rate:  [72-88] 72 (12/21 0544) Resp:  [17-18] 17 (12/21 0544) BP: (129-167)/(59-65) 167/65 (12/21 0544) SpO2:  [98 %-99 %] 98 % (12/21 0544) Last BM Date : 08/09/23  Intake/Output from previous day: 12/20 0701 - 12/21 0700 In: 110 [IV Piggyback:100] Out: 30 [Drains:30] Intake/Output this shift: No intake/output data recorded.  Alert, NAD Resp: normal respiratory effort Abd: soft, appropriately tender, vac in place holding suction, laparoscopic incisions c/d/I without cellulitis. Right IR drain w/ purulent fluid -bulb was in the incorrect shape and was not creating suction.  Once shaped in the correct formation, at least 30 more cc of purulent fluid evacuated   Lab Results:  Recent Labs    08/10/23 0346 08/11/23 0306  WBC 18.3* 17.9*  HGB 8.6* 9.1*  HCT 27.5* 27.8*  PLT 856* 873*   BMET Recent Labs    08/10/23 0346 08/11/23 0306  NA 132* 135  K 3.8 3.6  CL 98 98  CO2 27 29  GLUCOSE 101* 98  BUN 12 15  CREATININE 0.57 0.65  CALCIUM 9.6 10.1   PT/INR No results for input(s): "LABPROT", "INR" in the last 72 hours.  ABG No results for input(s): "PHART", "HCO3" in the last 72 hours.  Invalid input(s): "PCO2", "PO2"  Studies/Results: No results found.    Anti-infectives: Anti-infectives (From admission, onward)    Start     Dose/Rate Route Frequency Ordered Stop   08/10/23 2200  fluconazole (DIFLUCAN) tablet 400 mg        400 mg Oral Daily at bedtime 08/10/23 1203     08/10/23 1300  Ampicillin-Sulbactam (UNASYN) 3 g in sodium chloride 0.9 % 100 mL IVPB        3 g 200 mL/hr over 30 Minutes Intravenous Every 6 hours 08/10/23 1203     08/08/23 0000  micafungin (MYCAMINE) 100 mg in sodium chloride 0.9 % 100 mL IVPB  Status:   Discontinued        100 mg 105 mL/hr over 1 Hours Intravenous Every 24 hours 08/07/23 1549 08/10/23 1203   08/07/23 0000  micafungin (MYCAMINE) 150 mg in sodium chloride 0.9 % 100 mL IVPB  Status:  Discontinued        150 mg 107.5 mL/hr over 1 Hours Intravenous Every 24 hours 08/06/23 2301 08/07/23 1549   07/28/23 1330  piperacillin-tazobactam (ZOSYN) IVPB 3.375 g  Status:  Discontinued        3.375 g 12.5 mL/hr over 240 Minutes Intravenous Every 8 hours 07/28/23 1232 08/10/23 1203   07/26/23 1745  piperacillin-tazobactam (ZOSYN) IVPB 3.375 g        3.375 g 12.5 mL/hr over 240 Minutes Intravenous  Once 07/26/23 1737 07/26/23 1931       Assessment/Plan: Closed loop SBO with ischemic small bowel and perforation  POD 16, s/p laparoscopy converted to open small bowel resection Dr. Maisie Fus - post-op ileus resolved - s/p IR drain x2 on 12/16; culture with Candida, abx per ID, TG drain removed on 12/19 as culture was negative and output serous - TNA off - VAC M/W/F. - continue to mobilize as tolerated - continue tylenol, toradol, lidoderm for pain  - TOC regarding HH and VAC at home as she is approaching surgical readiness for discharge.   Severe  protein calorie malnutrition - eating well   FEN: Soft  VTE: LMWH ID: Zosyn 12/5>>, micafungin 12/17 >> started by ID for budding yeast on cultures   Laura Bradshaw 08/11/2023

## 2023-08-11 NOTE — Plan of Care (Signed)
  Problem: Education: Goal: Knowledge of General Education information will improve Description: Including pain rating scale, medication(s)/side effects and non-pharmacologic comfort measures Outcome: Progressing   Problem: Clinical Measurements: Goal: Ability to maintain clinical measurements within normal limits will improve Outcome: Progressing Goal: Will remain free from infection Outcome: Progressing Goal: Diagnostic test results will improve Outcome: Progressing Goal: Respiratory complications will improve Outcome: Progressing Goal: Cardiovascular complication will be avoided Outcome: Progressing   Problem: Nutrition: Goal: Adequate nutrition will be maintained Outcome: Progressing   Problem: Coping: Goal: Level of anxiety will decrease Outcome: Progressing   

## 2023-08-11 NOTE — Progress Notes (Signed)
Physical Therapy Treatment Patient Details Name: Laura Bradshaw MRN: [id number] DOB: 24-Apr-1951 Today's Date: 08/11/2023   History of Present Illness Pt is 72 yo female admitted on 07/26/23 with SBO, perforation, jejunal ischemia and is s/p open small bowel resection on 07/26/23.  Midline wound open - possible wound vac placement Monday, 12/9.  Pt with hx including but not limited to HTN, anxiety, depression, COPD, HSV, low back pain, mild dementia.    PT Comments  Pt progressing well.  VERY supportive Family always at her bedside.  Assisted OOB to amb in bathroom then in hallway went well.  General transfer comment: 25% VC's on proper hand placement to avoid pulling up on walker as well as safety with turn completion using walker.  Also asissted on/off toilet. General Gait Details: pt toleranced an increased distance amb to bathroom then in hallway with walker.  Avg RA 94% and HR 84.  Tolerated distance well. Pt plans to D/C to home when medically stable.     If plan is discharge home, recommend the following: A little help with walking and/or transfers;A little help with bathing/dressing/bathroom;Help with stairs or ramp for entrance;Assistance with cooking/housework   Can travel by private Scientist, research (medical) walker (2 wheels)    Recommendations for Other Services       Precautions / Restrictions Precautions Precautions: Fall Precaution Comments: abdominal wound vac, 1 bulb drain RIGHT Restrictions Weight Bearing Restrictions Per Provider Order: No     Mobility  Bed Mobility Overal bed mobility: Needs Assistance Bed Mobility: Supine to Sit     Supine to sit: Contact guard, Min assist     General bed mobility comments: increased time and assist with upper body as well as scooting to EOB    Transfers Overall transfer level: Needs assistance   Transfers: Sit to/from Stand Sit to Stand: Supervision, Contact guard assist           General  transfer comment: 25% VC's on proper hand placement to avoid pulling up on walker as well as safety with turn completion using walker.  Also asissted on/off toilet.    Ambulation/Gait Ambulation/Gait assistance: Supervision, Contact guard assist Gait Distance (Feet): 110 Feet Assistive device: Rolling walker (2 wheels) Gait Pattern/deviations: Decreased stride length, Step-through pattern Gait velocity: decreased     General Gait Details: pt toleranced an increased distance amb to bathroom then in hallway with walker.  Avg RA 94% and HR 84.  Tolerated distance well.   Stairs             Wheelchair Mobility     Tilt Bed    Modified Rankin (Stroke Patients Only)       Balance                                            Cognition Arousal: Alert Behavior During Therapy: WFL for tasks assessed/performed Overall Cognitive Status: Within Functional Limits for tasks assessed                                 General Comments: AxO x 3 very sweet Lady.  Nervous.  Feeling poorly.  Daughter and Son in [place] present.  Very supportive.        Exercises      General  Comments        Pertinent Vitals/Pain Pain Assessment Pain Assessment: No/denies pain    Home Living                          Prior Function            PT Goals (current goals can now be found in the care plan section) Progress towards PT goals: Progressing toward goals    Frequency    Min 1X/week      PT Plan      Co-evaluation              AM-PAC PT "6 Clicks" Mobility   Outcome Measure  Help needed turning from your back to your side while in a flat bed without using bedrails?: A Little Help needed moving from lying on your back to sitting on the side of a flat bed without using bedrails?: A Little Help needed moving to and from a bed to a chair (including a wheelchair)?: A Little Help needed standing up from a chair using your arms (e.g.,  wheelchair or bedside chair)?: A Little Help needed to walk in hospital room?: A Little Help needed climbing 3-5 steps with a railing? : A Little 6 Click Score: 18    End of Session Equipment Utilized During Treatment: Gait belt Activity Tolerance: Patient limited by fatigue Patient left: in chair;with call bell/phone within reach;with chair alarm set;with family/visitor present Nurse Communication: Mobility status PT Visit Diagnosis: Difficulty in walking, not elsewhere classified (R26.2)     Time: 1610-9604 PT Time Calculation (min) (ACUTE ONLY): 29 min  Charges:    $Gait Training: 8-22 mins $Therapeutic Activity: 8-22 mins PT General Charges $$ ACUTE PT VISIT: 1 Visit                     Felecia Shelling  PTA Acute  Rehabilitation Services Office M-F          [phone]

## 2023-08-12 ENCOUNTER — Inpatient Hospital Stay (HOSPITAL_COMMUNITY): Payer: Medicare PPO

## 2023-08-12 DIAGNOSIS — K9189 Other postprocedural complications and disorders of digestive system: Secondary | ICD-10-CM

## 2023-08-12 DIAGNOSIS — E440 Moderate protein-calorie malnutrition: Secondary | ICD-10-CM | POA: Diagnosis not present

## 2023-08-12 DIAGNOSIS — K567 Ileus, unspecified: Secondary | ICD-10-CM

## 2023-08-12 DIAGNOSIS — K651 Peritoneal abscess: Secondary | ICD-10-CM | POA: Diagnosis not present

## 2023-08-12 DIAGNOSIS — K56609 Unspecified intestinal obstruction, unspecified as to partial versus complete obstruction: Secondary | ICD-10-CM | POA: Diagnosis not present

## 2023-08-12 LAB — PHOSPHORUS: Phosphorus: 4.5 mg/dL (ref 2.5–4.6)

## 2023-08-12 LAB — GLUCOSE, CAPILLARY: Glucose-Capillary: 148 mg/dL — ABNORMAL HIGH (ref 70–99)

## 2023-08-12 LAB — CBC WITH DIFFERENTIAL/PLATELET
Abs Immature Granulocytes: 0.14 10*3/uL — ABNORMAL HIGH (ref 0.00–0.07)
Basophils Absolute: 0.1 10*3/uL (ref 0.0–0.1)
Basophils Relative: 1 %
Eosinophils Absolute: 0.5 10*3/uL (ref 0.0–0.5)
Eosinophils Relative: 3 %
HCT: 25.8 % — ABNORMAL LOW (ref 36.0–46.0)
Hemoglobin: 8.3 g/dL — ABNORMAL LOW (ref 12.0–15.0)
Immature Granulocytes: 1 %
Lymphocytes Relative: 20 %
Lymphs Abs: 3.2 10*3/uL (ref 0.7–4.0)
MCH: 31.1 pg (ref 26.0–34.0)
MCHC: 32.2 g/dL (ref 30.0–36.0)
MCV: 96.6 fL (ref 80.0–100.0)
Monocytes Absolute: 1.4 10*3/uL — ABNORMAL HIGH (ref 0.1–1.0)
Monocytes Relative: 9 %
Neutro Abs: 10.7 10*3/uL — ABNORMAL HIGH (ref 1.7–7.7)
Neutrophils Relative %: 66 %
Platelets: 816 10*3/uL — ABNORMAL HIGH (ref 150–400)
RBC: 2.67 MIL/uL — ABNORMAL LOW (ref 3.87–5.11)
RDW: 13.9 % (ref 11.5–15.5)
WBC: 16 10*3/uL — ABNORMAL HIGH (ref 4.0–10.5)
nRBC: 0 % (ref 0.0–0.2)

## 2023-08-12 LAB — COMPREHENSIVE METABOLIC PANEL
ALT: 57 U/L — ABNORMAL HIGH (ref 0–44)
AST: 24 U/L (ref 15–41)
Albumin: 2.3 g/dL — ABNORMAL LOW (ref 3.5–5.0)
Alkaline Phosphatase: 167 U/L — ABNORMAL HIGH (ref 38–126)
Anion gap: 9 (ref 5–15)
BUN: 30 mg/dL — ABNORMAL HIGH (ref 8–23)
CO2: 29 mmol/L (ref 22–32)
Calcium: 10.3 mg/dL (ref 8.9–10.3)
Chloride: 98 mmol/L (ref 98–111)
Creatinine, Ser: 0.49 mg/dL (ref 0.44–1.00)
GFR, Estimated: >60 mL/min (ref >60)
Glucose, Bld: 101 mg/dL — ABNORMAL HIGH (ref 70–99)
Potassium: 3.6 mmol/L (ref 3.5–5.1)
Sodium: 136 mmol/L (ref 135–145)
Total Bilirubin: 0.3 mg/dL (ref <1.2)
Total Protein: 6.1 g/dL — ABNORMAL LOW (ref 6.5–8.1)

## 2023-08-12 LAB — MAGNESIUM: Magnesium: 2.2 mg/dL (ref 1.7–2.4)

## 2023-08-12 MED ORDER — ONDANSETRON 4 MG PO TBDP: 4.0000 mg | ORAL_TABLET | Freq: Four times a day (QID) | ORAL | 0 refills | Status: AC | PRN

## 2023-08-12 MED ORDER — AMOXICILLIN-POT CLAVULANATE 875-125 MG PO TABS
1.0000 | ORAL_TABLET | Freq: Two times a day (BID) | ORAL | Status: DC
  Administered 2023-08-12: 1 via ORAL
  Filled 2023-08-12: qty 1

## 2023-08-12 MED ORDER — ENSURE ENLIVE PO LIQD: 237.0000 mL | Freq: Three times a day (TID) | ORAL | 12 refills | Status: AC

## 2023-08-12 MED ORDER — SIMETHICONE 80 MG PO CHEW: 40.0000 mg | CHEWABLE_TABLET | Freq: Four times a day (QID) | ORAL | 0 refills | Status: AC | PRN

## 2023-08-12 MED ORDER — FLUCONAZOLE 200 MG PO TABS: 400.0000 mg | ORAL_TABLET | Freq: Every day | ORAL | 1 refills | Status: DC

## 2023-08-12 MED ORDER — GERHARDT'S BUTT CREAM: 1.0000 | TOPICAL_CREAM | Freq: Two times a day (BID) | CUTANEOUS | 0 refills | Status: AC

## 2023-08-12 MED ORDER — LIDOCAINE 5 % EX PTCH: 2.0000 | MEDICATED_PATCH | CUTANEOUS | 0 refills | Status: AC

## 2023-08-12 MED ORDER — NICOTINE 21 MG/24HR TD PT24: 21.0000 mg | MEDICATED_PATCH | Freq: Every day | TRANSDERMAL | 0 refills | Status: AC

## 2023-08-12 MED ORDER — AMOXICILLIN-POT CLAVULANATE 875-125 MG PO TABS: 1.0000 | ORAL_TABLET | Freq: Two times a day (BID) | ORAL | 1 refills | Status: DC

## 2023-08-12 NOTE — Progress Notes (Signed)
17 Days Post-Op   Subjective/Chief Complaint: Continues to do well, no specific complaints this morning.  Flatus this morning, bowel movement yesterday morning, tolerating p.o.  Objective: Vital signs in last 24 hours: Temp:  [97.4 F (36.3 C)-98 F (36.7 C)] 97.4 F (36.3 C) (12/22 0425) Pulse Rate:  [66-86] 66 (12/22 0425) Resp:  [14-20] 14 (12/22 0425) BP: (121-135)/(61-71) 121/61 (12/22 0425) SpO2:  [97 %-99 %] 98 % (12/22 0425) Last BM Date : 08/11/23  Intake/Output from previous day: 12/21 0701 - 12/22 0700 In: 350 [P.O.:350] Out: 30 [Drains:30] Intake/Output this shift: No intake/output data recorded.  Alert, NAD Resp: normal respiratory effort Abd: soft, essentially non-tender, vac in place holding suction, laparoscopic incisions c/d/I without cellulitis. Right IR drain w/ purulent fluid    Lab Results:  Recent Labs    08/11/23 0306 08/12/23 0313  WBC 17.9* 16.0*  HGB 9.1* 8.3*  HCT 27.8* 25.8*  PLT 873* 816*   BMET Recent Labs    08/11/23 0306 08/12/23 0313  NA 135 136  K 3.6 3.6  CL 98 98  CO2 29 29  GLUCOSE 98 101*  BUN 15 30*  CREATININE 0.65 0.49  CALCIUM 10.1 10.3   PT/INR No results for input(s): "LABPROT", "INR" in the last 72 hours.  ABG No results for input(s): "PHART", "HCO3" in the last 72 hours.  Invalid input(s): "PCO2", "PO2"  Studies/Results: DG CHEST PORT 1 VIEW Result Date: 08/11/2023 CLINICAL DATA:  Shortness of breath. EXAM: PORTABLE CHEST 1 VIEW COMPARISON:  Chest radiograph dated 08/03/2023. FINDINGS: The heart size and mediastinal contours are within normal limits. There is mild right basilar atelectasis. A trace pleural effusion is difficult to exclude. The left lung is clear. No pneumothorax. Degenerative changes are seen in the spine. A right upper extremity peripherally inserted central venous catheter tip overlies the superior vena cava. IMPRESSION: Mild right basilar atelectasis. A trace pleural effusion is difficult  to exclude. Electronically Signed   By: Romona Curls M.D.   On: 08/11/2023 10:12      Anti-infectives: Anti-infectives (From admission, onward)    Start     Dose/Rate Route Frequency Ordered Stop   08/10/23 2200  fluconazole (DIFLUCAN) tablet 400 mg        400 mg Oral Daily at bedtime 08/10/23 1203     08/10/23 1300  Ampicillin-Sulbactam (UNASYN) 3 g in sodium chloride 0.9 % 100 mL IVPB        3 g 200 mL/hr over 30 Minutes Intravenous Every 6 hours 08/10/23 1203     08/08/23 0000  micafungin (MYCAMINE) 100 mg in sodium chloride 0.9 % 100 mL IVPB  Status:  Discontinued        100 mg 105 mL/hr over 1 Hours Intravenous Every 24 hours 08/07/23 1549 08/10/23 1203   08/07/23 0000  micafungin (MYCAMINE) 150 mg in sodium chloride 0.9 % 100 mL IVPB  Status:  Discontinued        150 mg 107.5 mL/hr over 1 Hours Intravenous Every 24 hours 08/06/23 2301 08/07/23 1549   07/28/23 1330  piperacillin-tazobactam (ZOSYN) IVPB 3.375 g  Status:  Discontinued        3.375 g 12.5 mL/hr over 240 Minutes Intravenous Every 8 hours 07/28/23 1232 08/10/23 1203   07/26/23 1745  piperacillin-tazobactam (ZOSYN) IVPB 3.375 g        3.375 g 12.5 mL/hr over 240 Minutes Intravenous  Once 07/26/23 1737 07/26/23 1931       Assessment/Plan: Closed loop SBO with  ischemic small bowel and perforation  POD 17, s/p laparoscopy converted to open small bowel resection Dr. Maisie Fus - post-op ileus resolved - s/p IR drain x2 on 12/16; culture with Candida, abx per ID, TG drain removed on 12/19 as culture was negative and output serous - TNA off - VAC M/W/F. - continue to mobilize as tolerated - continue tylenol, toradol, lidoderm for pain  - TOC regarding HH and VAC at home as she is approaching surgical readiness for discharge.   Severe protein calorie malnutrition - eating well    FEN: Soft  VTE: LMWH ID: Zosyn 12/5>>, micafungin 12/17 >> started by ID for budding yeast on cultures   Laura Bradshaw 08/12/2023

## 2023-08-12 NOTE — Plan of Care (Signed)
  Problem: Clinical Measurements: Goal: Ability to maintain clinical measurements within normal limits will improve 08/12/2023 0551 by Pauline Aus, RN Outcome: Progressing 08/12/2023 0551 by Pauline Aus, RN Outcome: Progressing Goal: Will remain free from infection 08/12/2023 0551 by Pauline Aus, RN Outcome: Progressing 08/12/2023 0551 by Pauline Aus, RN Outcome: Progressing Goal: Diagnostic test results will improve 08/12/2023 0551 by Pauline Aus, RN Outcome: Progressing 08/12/2023 0551 by Pauline Aus, RN Outcome: Progressing Goal: Respiratory complications will improve 08/12/2023 0551 by Pauline Aus, RN Outcome: Progressing 08/12/2023 0551 by Pauline Aus, RN Outcome: Progressing Goal: Cardiovascular complication will be avoided 08/12/2023 0551 by Pauline Aus, RN Outcome: Progressing 08/12/2023 0551 by Pauline Aus, RN Outcome: Progressing

## 2023-08-12 NOTE — Plan of Care (Signed)
  Problem: Education: Goal: Knowledge of General Education information will improve Description: Including pain rating scale, medication(s)/side effects and non-pharmacologic comfort measures Outcome: Progressing   Problem: Health Behavior/Discharge Planning: Goal: Ability to manage health-related needs will improve Outcome: Progressing   Problem: Clinical Measurements: Goal: Ability to maintain clinical measurements within normal limits will improve Outcome: Progressing Goal: Will remain free from infection Outcome: Progressing Goal: Diagnostic test results will improve Outcome: Progressing Goal: Respiratory complications will improve Outcome: Progressing Goal: Cardiovascular complication will be avoided Outcome: Progressing   Problem: Activity: Goal: Risk for activity intolerance will decrease Outcome: Progressing   Problem: Nutrition: Goal: Adequate nutrition will be maintained Outcome: Progressing   Problem: Coping: Goal: Level of anxiety will decrease Outcome: Progressing   Problem: Elimination: Goal: Will not experience complications related to bowel motility Outcome: Progressing Goal: Will not experience complications related to urinary retention Outcome: Progressing   Problem: Safety: Goal: Ability to remain free from injury will improve Outcome: Progressing   Problem: Skin Integrity: Goal: Risk for impaired skin integrity will decrease Outcome: Progressing   Problem: Education: Goal: Ability to describe self-care measures that may prevent or decrease complications (Diabetes Survival Skills Education) will improve Outcome: Progressing Goal: Individualized Educational Video(s) Outcome: Progressing   Problem: Fluid Volume: Goal: Ability to maintain a balanced intake and output will improve Outcome: Progressing   Problem: Health Behavior/Discharge Planning: Goal: Ability to identify and utilize available resources and services will improve Outcome:  Progressing Goal: Ability to manage health-related needs will improve Outcome: Progressing

## 2023-08-12 NOTE — Discharge Summary (Signed)
Physician Discharge Summary   Patient: Laura Bradshaw MRN: [id number] DOB: Apr 22, 1951  Admit date:     07/26/2023  Discharge date: 08/12/23  Discharge Physician: Marguerita Merles, DO   PCP: Alberteen Sam, FNP   Recommendations at discharge:   Follow-up with PCP within 1 to 2 weeks and repeat CBC, CMP, mag, Phos within 1 week Follow-up with infectious disease in outpatient setting and continue antibiotics for extended period time and patient has an appointment scheduled with Dr. Thedore Mins on 08/29/2023 Follow-up with interventional radiology drain clinic as an outpatient and have drain injection and repeat CT scan when drain output becomes minimal Follow-up with general surgery in outpatient setting within 1 to 2 weeks  Discharge Diagnoses: Principal Problem:   Intra-abdominal abscess (HCC) Active Problems:   Small bowel obstruction (HCC)   Malnutrition of moderate degree  Resolved Problems:   * No resolved hospital problems. [facility] Course: The patient is a 53 with history of HTN, anxiety, depression, COPD, HSV, low back pain, mild dementia comes to the hospital with acute abdominal pain and vomiting.  She was found to have closed-loop obstruction with ischemia.  Taken to the OR for laparoscopic which was eventually converted to open requiring small bowel resection due to perforation on 07/26/23 by Dr. Maisie Fus.   Postop developed ileus requiring NG tube later repeat scan showed intra-abdominal abscess therefore IR consulted.  ID has been following and patient has been on broad-spectrum IV antibiotics.  Patient underwent CT-guided right abdominal/pelvic asp/drainage on 08/06/23.  Her p.o. intake continues to improve and TPN has been weaned off.  Her left pelvic/transgluteal drain is removed today and cultures are also growing rare Candida albicans so ID added micafungin for the abscess culture but are hoping that is fluconazole sensitive.    ID has now changed her from the Zosyn and  micafungin to Unasyn and fluconazole she did desaturate on home ambulatory O2 screen the day before yesterday and PT OT recommending discharging back home when she is medically stable.  She is now subsequently weaned off of oxygen and was given a dose of diuresis yesterday and will be given another 1 today.   Her volume status improved and she is cleared from the ID perspective as she is changed to oral antibiotics and oral antifungals.  She is medically stable for discharge at this time will need follow-up with PCP, general surgery, interventional radiology and infectious diseases outpatient setting.  She ambulated and did not desaturate and is doing well and her volume status is much improved.  WBC continues to improve and she will be discharged home with home health services  Assessment & Plan:  Principal Problem:   Intra-abdominal abscess [facility]) Active Problems:   Small bowel obstruction (HCC)   Malnutrition of moderate degree   Small bowel obstruction, perforation with ischemia requiring open resection and is postoperative day 16 Postop complicated by ileus and intra-abdominal abscesses -Initial surgery performed on 12/5.  Postop developed ileus requiring NG tube which has now been removed.   -Repeat scan shows concerns of abscess therefore IR performed CT-guided drainage of the right abdominal/pelvic abscess and 2 drains were placed.  Currently one of them has been removed -WBC Trend: Recent Labs  Lab 08/06/23 0231 08/07/23 0227 08/08/23 0346 08/09/23 0301 08/10/23 0346 08/11/23 0306 08/12/23 0313  WBC 24.8* 19.0* 17.0* 17.9* 18.3* 17.9* 16.0*  -Follow cultures, tailor antibiotics.  Advised out of bed to chair -Was on empiric Zosyn, ID following have added Micafungin; now  infectious disease has changed her to Unasyn and changing to fluconazole given the fact that most Candida albicans species are sensitive to the azoles: Per ID if she does well on Unasyn; case was discussed with ID  who recommends changing her to Augmentin 875 125 mg twice daily and give her a dose of 30 days as well as fluconazole 400 mg daily and give her a dose for 30 days with 1 refill each. -TPN was weaned and discontinued -Continue with pain control per general surgery recommendations and she is on cyclobenzaprine 10 mg p.o. nightly, lidocaine 2 patches transdermally q. 12, ketorolac 50 mg IV every 8 as needed for moderate pain as well as methocarbamol as well as IV morphine 1 mg every 4 as needed severe pain -Patient has an infectious disease appointment on 08/29/2023 with Dr. Thedore Mins at 10 AM -IR recommending continuing current treatment and drain irrigation and recommending follow-up CT 1 drainage output is minimal or if WBC abruptly increases and recommends that she will need drain irrigation prior to removal -WOC nurse changed her wound VAC on 08/10/2023 -Surgery has signed off the patient's case now and home health and wound VAC are being arranged given that she is approaching surgical readiness for discharge -She is medically stable for discharge and will need to follow-up with PCP, general surgery, interventional radiology and has an outpatient appoint with infectious diseases on 08/29/2023   Electrolyte Abnormalities including Hypokalemia Hyponatremia Hypophosphatemia Hypercalcemia, resolved Recent Labs  Lab 08/06/23 0231 08/07/23 0227 08/08/23 0346 08/09/23 0301 08/10/23 0346 08/11/23 0306 08/12/23 0313  NA 127* 134* 132* 133* 132* 135 136  K 3.4* 3.9 3.8 3.9 3.8 3.6 3.6  MG 2.4 2.2 2.0 2.0 2.0 2.1 2.2  PHOS 2.5  --   --   --  4.0 4.1 4.5  CALCIUM 9.2 9.4 9.3 9.5 9.6 10.1 10.3  -IVF now stopped and all electrolyte abnormalities have been resolved -Repeat CMP, Mag, and Phos within 1 week  Essential Hypertension -On Lisinopril 10 mg p.o. nightly -Continue with IV Hydralazine 10 mg every 4 as needed for systolic blood >180 -Continue to monitor blood pressures per protocol -Last blood  pressure reading was 136/52   Anxiety and Depression -Stable and have resumed Escitalopram 20 mg p.o. daily   COPD -C/w As needed bronchodilators with Dulera, Singulair and as needed albuterol 3 mL IH Q4 as needed wheezing and shortness of breath SpO2: 97 % O2 Flow Rate (L/min): 2 L/min; was able to be weaned off of supplemental oxygen -Desaturated on ambulatory O2 screen the day before yesterday and will repeat again prior to discharge.  Will also give her another dose of Lasix due to to volume overload -Checked chest x-ray and showed "Right arm PICC line tip is in the distal SVC. Heart size and mediastinal contours are unremarkable. Aortic atherosclerosis. No interstitial edema or airspace disease. Blunting of the right costophrenic angle. Pigtail drainage catheter is noted overlying the right upper quadrant of the abdomen." -Weaned off O2 and does not need O2 for D/C     Leukocytosis, worsened in the setting of Intraabdominal Abscesses  -See Above  Volume Overload, improved -Was documented that she is 12.807 L positive -Will give her another dose of IV Lasix 40 mg today and repeat CXR in the AM -Strict I's and O's and Daily Weights;  Intake/Output Summary (Last 24 hours) at 08/12/2023 2049 Last data filed at 08/12/2023 1600 Gross per 24 hour  Intake 240 ml  Output 10 ml  Net 230 ml  -Continue to Monitor for S/Sx of Volume Overload -Repeat chest x-ray this a.m. done and showed "The heart size and mediastinal contours are within normal limits. There is mild right basilar atelectasis. A trace pleural effusion is difficult to exclude. The left lung is clear. No pneumothorax. Degenerative changes are seen in the spine. A right upper extremity peripherally inserted central venous catheter tip overlies the superior vena cava."   Abnormal LFTs  -Elevated in setting of Abscess along the periphery of R Hepatic Lobe -AST/ALT Trend: Recent Labs  Lab 08/06/23 0231 08/07/23 0227  08/08/23 0346 08/09/23 0301 08/10/23 0346 08/11/23 0306 08/12/23 0313  AST 44* 41 36 34 39 35 24  ALT 84* 85* 81* 74* 74* 74* 57*  -Continue to Monitor and Trend and repeat CMP in the AM and may need to limit the amount of Acetaminophen that she is using as it is 1000 mg every 8 hours -Repeat CMP within 1 week    Hyperglycemia in the setting of Prediabetes -HbA1c 6.1%.  -Continue sliding scale and Accu-Cheks -CBG trend: Recent Labs  Lab 08/08/23 2036 08/09/23 0421 08/09/23 1141 08/09/23 2001 08/11/23 1218 08/11/23 1745 08/12/23 1209  GLUCAP 127* 103* 106* 136* 110* 139* 148*   Normocytic Anemia -Hgb/Hct Trend relatively stable in mid 8 to low 9 range: Recent Labs  Lab 08/06/23 0231 08/07/23 0227 08/08/23 0346 08/09/23 0301 08/10/23 0346 08/11/23 0306 08/12/23 0313  HGB 9.1* 8.9* 8.6* 8.6* 8.6* 9.1* 8.3*  HCT 28.7* 28.3* 27.2* 26.7* 27.5* 27.8* 25.8*  MCV 98.6 99.6 100.0 97.8 99.3 96.9 96.6  -Continue to monitor for signs or symptoms of bleeding; no overt bleeding noted -Repeat CBC within 1 week  Thrombocytosis -Likely reactive in the setting of above -Platelet Count Trend: Recent Labs  Lab 08/06/23 0231 08/07/23 0227 08/08/23 0346 08/09/23 0301 08/10/23 0346 08/11/23 0306 08/12/23 0313  PLT 627* 689* 746* 828* 856* 873* 816*  -Continue to Monitor and Trend and repeat CBC within 1 week   Moderate Protein Calorie Malnutrition Nutrition Status: Nutrition Problem: Moderate Malnutrition Etiology: acute illness Signs/Symptoms: mild fat depletion, mild muscle depletion, energy intake < 75% for > 7 days Interventions: TPN  Hypoalbuminemia -Patient's Albumin Trend: Recent Labs  Lab 08/06/23 0231 08/07/23 0227 08/08/23 0346 08/09/23 0301 08/10/23 0346 08/11/23 0306 08/12/23 0313  ALBUMIN 2.3* 2.3* 2.2* 2.2* 2.2* 2.4* 2.3*  -Continue to Monitor and Trend and repeat CMP in the AM  Nutrition Documentation    Flowsheet Row ED to Hosp-Admission  (Discharged) from 07/26/2023 in [facility]  Nutrition Problem Moderate Malnutrition  Etiology acute illness  Nutrition Goal Patient will meet greater than or equal to 90% of their needs  Interventions TPN      Consultants: Infectious diseases, general surgery, interventional radiology Procedures performed: As delineated as above Disposition: Home health Diet recommendation:  Discharge Diet Orders (From admission, onward)     Start     Ordered   08/12/23 0000  Diet - low sodium heart healthy        08/12/23 1237           Cardiac and Carb modified diet DISCHARGE MEDICATION: Allergies as of 08/12/2023       Reactions   Ciprofloxacin Hives   Codeine Other (See Comments)   Welts    Sulfonamide Derivatives Itching   welts   Hydrocodone Anxiety        Medication List     STOP taking these  medications    gabapentin 300 MG capsule Commonly known as: NEURONTIN   hydrochlorothiazide 25 MG tablet Commonly known as: HYDRODIURIL   naproxen 500 MG tablet Commonly known as: NAPROSYN       TAKE these medications    acetaminophen 500 MG tablet Commonly known as: TYLENOL Take 1,000 mg by mouth as needed for moderate pain (pain score 4-6).   albuterol 108 (90 Base) MCG/ACT inhaler Commonly known as: VENTOLIN HFA Inhale 2 puffs into the lungs every 4 (four) hours as needed for wheezing or shortness of breath.   ALPRAZolam 0.5 MG tablet Commonly known as: XANAX Take 0.5 mg by mouth in the morning and at bedtime.   amoxicillin-clavulanate 875-125 MG tablet Commonly known as: AUGMENTIN Take 1 tablet by mouth every 12 (twelve) hours.   aspirin 325 MG tablet Take 325 mg by mouth daily.   cyclobenzaprine 10 MG tablet Commonly known as: FLEXERIL Take 10 mg by mouth at bedtime.   diclofenac 75 MG EC tablet Commonly known as: VOLTAREN Take 75 mg by mouth 3 (three) times daily.   Emetrol 230 MG Chew Generic drug: Sodium  Citrate Dihydrate Chew 460 mg by mouth as needed (nausea).   escitalopram 20 MG tablet Commonly known as: LEXAPRO Take 20 mg by mouth daily.   feeding supplement Liqd Take 237 mLs by mouth 3 (three) times daily with meals.   fluconazole 200 MG tablet Commonly known as: DIFLUCAN Take 2 tablets (400 mg total) by mouth at bedtime.   fluticasone 50 MCG/ACT nasal spray Commonly known as: FLONASE Place 2 sprays into both nostrils as needed for allergies.   fluticasone-salmeterol 250-50 MCG/ACT Aepb Commonly known as: ADVAIR Inhale 2 puffs into the lungs as needed (wheezing/SOB).   Gerhardt's butt cream Crea Apply 1 Application topically 2 (two) times daily.   lidocaine 5 % Commonly known as: LIDODERM Place 2 patches onto the skin daily. Remove & Discard patch within 12 hours or as directed by MD Start taking on: August 13, 2023   lisinopril 10 MG tablet Commonly known as: ZESTRIL Take 10 mg by mouth at bedtime.   methocarbamol 500 MG tablet Commonly known as: ROBAXIN Take 1 tablet (500 mg total) by mouth every 8 (eight) hours as needed for muscle spasms.   montelukast 10 MG tablet Commonly known as: SINGULAIR Take 10 mg by mouth at bedtime.   nicotine 21 mg/24hr patch Commonly known as: NICODERM CQ - dosed in mg/24 hours Place 1 patch (21 mg total) onto the skin daily. Start taking on: August 13, 2023   ondansetron 4 MG disintegrating tablet Commonly known as: ZOFRAN-ODT Take 1 tablet (4 mg total) by mouth every 6 (six) hours as needed for nausea.   oxyCODONE 5 MG immediate release tablet Commonly known as: Oxy IR/ROXICODONE Take 1 tablet (5 mg total) by mouth every 4 (four) hours as needed (pain).   simethicone 80 MG chewable tablet Commonly known as: MYLICON Chew 0.5 tablets (40 mg total) by mouth every 6 (six) hours as needed for flatulence (bloating).   simvastatin 40 MG tablet Commonly known as: ZOCOR Take 40 mg by mouth daily.                Durable Medical Equipment  (From admission, onward)           Start     Ordered   08/03/23 0950  For home use only DME Walker rolling  Once       Question Answer Comment  Walker:  With 5 Inch Wheels   Patient needs a walker to treat with the following condition Generalized weakness      08/03/23 0950   07/31/23 0959  For home use only DME Negative pressure wound device  Once       Question Answer Comment  Frequency of dressing change 3 times per week   Length of need 3 Months   Dressing type Foam   Amount of suction 125 mm/Hg   Pressure application Continuous pressure   Supplies 10 canisters and 15 dressings per month for duration of therapy      07/31/23 0959              Discharge Care Instructions  (From admission, onward)           Start     Ordered   08/12/23 0000  Discharge wound care:       Comments: Per Surgery with Wound Vac   08/12/23 1237            Follow-up Information     [facility] Follow up.   Specialty: Home Health Services Why: Frances Furbish will follow up with you at discharge to provide home health services Contact information: [address] [place] [address] [phone]         Romie Levee, MD Follow up in 3 week(s).   Specialties: General Surgery, Colon and Rectal Surgery Why: Arrive 30 minutes prior to your appointment time, Please bring your insurance card and photo ID Contact information: [address] [place] [address] [phone]                Discharge Exam: Ceasar Mons Weights   08/04/23 0505 08/06/23 0453 08/07/23 0449  Weight: 72.3 kg 65.1 kg 65.6 kg   Vitals:   08/12/23 0901 08/12/23 1212  BP:  (!) 136/52  Pulse:  73  Resp:  18  Temp:  98.2 F (36.8 C)  SpO2: 96% 97%   Examination: Physical Exam:  Constitutional: WN/WD chronically ill-appearing Caucasian female who appears improved and is calm and comfortable in no acute distress Respiratory:  Diminished to auscultation bilaterally with some coarse breath sounds, no wheezing, rales, rhonchi or crackles. Normal respiratory effort and patient is not tachypenic. No accessory muscle use.  Unlabored breathing and not wearing any supplemental oxygen via nasal cannula Cardiovascular: RRR, no murmurs / rubs / gallops. S1 and S2 auscultated.  Trace extremity edema Abdomen: Soft, slightly-tender, secondary to body habitus and has a wound VAC in place and had abdominal drain in place. Bowel sounds positive.  GU: Deferred. Musculoskeletal: No clubbing / cyanosis of digits/nails. No joint deformity upper and lower extremities on limited skin evaluation. Skin: No rashes, lesions, ulcers on the skin of the. No induration; Warm and dry.  Neurologic: CN 2-12 grossly intact with no focal deficits. Romberg sign and cerebellar reflexes not assessed.  Psychiatric: Normal judgment and insight. Alert and oriented x 3. Normal mood and appropriate affect.   Condition at discharge: stable  The results of significant diagnostics from this hospitalization (including imaging, microbiology, ancillary and laboratory) are listed below for reference.   Imaging Studies: DG CHEST PORT 1 VIEW Result Date: 08/12/2023 CLINICAL DATA:  Shortness of breath.  Hypertension, COPD. EXAM: PORTABLE CHEST 1 VIEW COMPARISON:  08/11/2023 FINDINGS: Right arm PICC line tip is in the distal SVC. Heart size and mediastinal contours are unremarkable. Aortic atherosclerosis. No interstitial edema or airspace disease. Blunting of the right costophrenic  angle. Pigtail drainage catheter is noted overlying the right upper quadrant of the abdomen. IMPRESSION: 1. Persistent asymmetric elevation of the right hemidiaphragm. 2. Blunting of the right costophrenic angle compatible with small effusion. Electronically Signed   By: Signa Kell M.D.   On: 08/12/2023 09:15   DG CHEST PORT 1 VIEW Result Date: 08/11/2023 CLINICAL DATA:  Shortness of  breath. EXAM: PORTABLE CHEST 1 VIEW COMPARISON:  Chest radiograph dated 08/03/2023. FINDINGS: The heart size and mediastinal contours are within normal limits. There is mild right basilar atelectasis. A trace pleural effusion is difficult to exclude. The left lung is clear. No pneumothorax. Degenerative changes are seen in the spine. A right upper extremity peripherally inserted central venous catheter tip overlies the superior vena cava. IMPRESSION: Mild right basilar atelectasis. A trace pleural effusion is difficult to exclude. Electronically Signed   By: Romona Curls M.D.   On: 08/11/2023 10:12   CT GUIDED PERITONEAL/RETROPERITONEAL FLUID DRAIN BY PERC CATH Result Date: 08/06/2023 CLINICAL DATA:  Status post small bowel resection for closed loop obstruction with small bowel ischemia and perforation on 07/26/2023. Development of postoperative leukocytosis and fluid collections primarily adjacent to the liver and in the lower posterior pelvis. EXAM: 1. CT GUIDED PERCUTANEOUS CATHETER DRAINAGE OF RIGHT ABDOMINAL PERITONEAL ABSCESS 2. CT-GUIDED PERCUTANEOUS CATHETER DRAINAGE OF PELVIC PERITONEAL ABSCESS ANESTHESIA/SEDATION: Moderate (conscious) sedation was employed during this procedure. A total of Versed 2.5 mg and Fentanyl 100 mcg was administered intravenously. Moderate Sedation Time: 52 minutes. The patient's level of consciousness and vital signs were monitored continuously by radiology nursing throughout the procedure under my direct supervision. PROCEDURE: The procedure, risks, benefits, and alternatives were explained to the patient. Questions regarding the procedure were encouraged and answered. The patient understands and consents to the procedure. A time out was performed prior to initiating the procedure. Initial CT through the upper to mid abdomen was performed in a supine position. The right abdominal wall was prepped with chlorhexidine in a sterile fashion, and a sterile drape was applied  covering the operative field. A sterile gown and sterile gloves were used for the procedure. Local anesthesia was provided with 1% Lidocaine. Under CT guidance, an 18 gauge trocar needle was advanced into a fluid collection along the inferior aspect of the liver. After confirming needle tip position, a guidewire was advanced. The tract was dilated over the guidewire and a 10 French percutaneous drainage catheter advanced. Final catheter position was confirmed by CT. A fluid sample was withdrawn and sent for culture analysis. The catheter was then connected to suction bulb drainage. It was secured at the skin exit site with a Prolene retention suture, adhesive StatLock device and overlying dressing. Imaging was performed in a left lateral decubitus position followed by a right lateral decubitus position with the left side up. From a left transgluteal approach, a new 18 gauge trocar needle was advanced into a deep pelvic fluid collection. Confirming needle tip position, a guidewire was advanced into the collection and wire position confirmed by CT. The percutaneous tract was dilated over the guidewire and a 10 French percutaneous drainage catheter advanced over the wire. Final catheter position was confirmed by CT. A fluid sample was withdrawn from the drain and sent for culture analysis. The catheter was connected to suction bulb drainage. It was secured at the skin exit site with a Prolene retention suture, adhesive StatLock device and overlying dressing. RADIATION DOSE REDUCTION: This exam was performed according to the departmental dose-optimization program which includes automated exposure  control, adjustment of the mA and/or kV according to patient size and/or use of iterative reconstruction technique. COMPLICATIONS: None FINDINGS: The fluid collection spanning along much of the anterolateral aspect of the liver was first addressed. From an anterior approach, the inferior aspect of this collection was punctured  in a cranial direction such that the guidewire advanced superiorly towards the superior aspect of the collection. After drain placement, the drainage catheter extends up towards the superior aspect. There was return of purulent fluid. Initial decubitus imaging with the right side up was not optimal for approach to the posterior pelvic fluid collection. From the opposite decubitus position, a left transgluteal approach was chosen for access. The fluid collection yielded dark bloody fluid. IMPRESSION: 1. Right-sided perihepatic abscess yielded purulent fluid. A 10 French drainage catheter was placed and attached to suction bulb drainage. A sample of purulent fluid from this abscess was sent for culture analysis. 2. Left transgluteal approach to the deep pelvic abscess yielded dark bloody fluid. A 10 French drainage catheter was placed and attached to suction bulb drainage. A sample of bloody fluid from this collection was sent for culture analysis. Electronically Signed   By: Irish Lack M.D.   On: 08/06/2023 15:40   CT GUIDED PERITONEAL/RETROPERITONEAL FLUID DRAIN BY PERC CATH Result Date: 08/06/2023 CLINICAL DATA:  Status post small bowel resection for closed loop obstruction with small bowel ischemia and perforation on 07/26/2023. Development of postoperative leukocytosis and fluid collections primarily adjacent to the liver and in the lower posterior pelvis. EXAM: 1. CT GUIDED PERCUTANEOUS CATHETER DRAINAGE OF RIGHT ABDOMINAL PERITONEAL ABSCESS 2. CT-GUIDED PERCUTANEOUS CATHETER DRAINAGE OF PELVIC PERITONEAL ABSCESS ANESTHESIA/SEDATION: Moderate (conscious) sedation was employed during this procedure. A total of Versed 2.5 mg and Fentanyl 100 mcg was administered intravenously. Moderate Sedation Time: 52 minutes. The patient's level of consciousness and vital signs were monitored continuously by radiology nursing throughout the procedure under my direct supervision. PROCEDURE: The procedure, risks,  benefits, and alternatives were explained to the patient. Questions regarding the procedure were encouraged and answered. The patient understands and consents to the procedure. A time out was performed prior to initiating the procedure. Initial CT through the upper to mid abdomen was performed in a supine position. The right abdominal wall was prepped with chlorhexidine in a sterile fashion, and a sterile drape was applied covering the operative field. A sterile gown and sterile gloves were used for the procedure. Local anesthesia was provided with 1% Lidocaine. Under CT guidance, an 18 gauge trocar needle was advanced into a fluid collection along the inferior aspect of the liver. After confirming needle tip position, a guidewire was advanced. The tract was dilated over the guidewire and a 10 French percutaneous drainage catheter advanced. Final catheter position was confirmed by CT. A fluid sample was withdrawn and sent for culture analysis. The catheter was then connected to suction bulb drainage. It was secured at the skin exit site with a Prolene retention suture, adhesive StatLock device and overlying dressing. Imaging was performed in a left lateral decubitus position followed by a right lateral decubitus position with the left side up. From a left transgluteal approach, a new 18 gauge trocar needle was advanced into a deep pelvic fluid collection. Confirming needle tip position, a guidewire was advanced into the collection and wire position confirmed by CT. The percutaneous tract was dilated over the guidewire and a 10 French percutaneous drainage catheter advanced over the wire. Final catheter position was confirmed by CT. A fluid sample was  withdrawn from the drain and sent for culture analysis. The catheter was connected to suction bulb drainage. It was secured at the skin exit site with a Prolene retention suture, adhesive StatLock device and overlying dressing. RADIATION DOSE REDUCTION: This exam was  performed according to the departmental dose-optimization program which includes automated exposure control, adjustment of the mA and/or kV according to patient size and/or use of iterative reconstruction technique. COMPLICATIONS: None FINDINGS: The fluid collection spanning along much of the anterolateral aspect of the liver was first addressed. From an anterior approach, the inferior aspect of this collection was punctured in a cranial direction such that the guidewire advanced superiorly towards the superior aspect of the collection. After drain placement, the drainage catheter extends up towards the superior aspect. There was return of purulent fluid. Initial decubitus imaging with the right side up was not optimal for approach to the posterior pelvic fluid collection. From the opposite decubitus position, a left transgluteal approach was chosen for access. The fluid collection yielded dark bloody fluid. IMPRESSION: 1. Right-sided perihepatic abscess yielded purulent fluid. A 10 French drainage catheter was placed and attached to suction bulb drainage. A sample of purulent fluid from this abscess was sent for culture analysis. 2. Left transgluteal approach to the deep pelvic abscess yielded dark bloody fluid. A 10 French drainage catheter was placed and attached to suction bulb drainage. A sample of bloody fluid from this collection was sent for culture analysis. Electronically Signed   By: Irish Lack M.D.   On: 08/06/2023 15:40   CT ABDOMEN PELVIS W CONTRAST Result Date: 08/03/2023 CLINICAL DATA:  Abdominal pain, post-op evaluate for abscess formation EXAM: CT ABDOMEN AND PELVIS WITH CONTRAST TECHNIQUE: Multidetector CT imaging of the abdomen and pelvis was performed using the standard protocol following bolus administration of intravenous contrast. RADIATION DOSE REDUCTION: This exam was performed according to the departmental dose-optimization program which includes automated exposure control,  adjustment of the mA and/or kV according to patient size and/or use of iterative reconstruction technique. CONTRAST:  OMNIPAQUE IOHEXOL 300 MG/ML SOLN, 30mL OMNIPAQUE IOHEXOL 300 MG/ML SOLN COMPARISON:  07/31/2023, 07/26/2023 FINDINGS: Lower chest: Small bilateral pleural effusions with dependent bibasilar atelectasis or consolidation, similar to prior. Heart size within normal limits. Coronary artery atherosclerosis. Hepatobiliary: Mild hepatic steatosis. No focal liver lesion. Unremarkable gallbladder. No hyperdense gallstone. No biliary dilatation. Pancreas: Unremarkable. No pancreatic ductal dilatation or surrounding inflammatory changes. Spleen: Normal in size without focal abnormality. Adrenals/Urinary Tract: Unremarkable adrenal glands. Kidneys enhance symmetrically without focal lesion, stone, or hydronephrosis. Ureters are nondilated. Urinary bladder appears unremarkable for the degree of distention. Stomach/Bowel: Enteric tube extends into the distal gastric body. Stomach within normal limits. Postsurgical changes from small bowel resection. Mildly dilated loops of small bowel upstream from the anastomotic site slightly decreased in caliber compared to the prior study. There is circumferential wall thickening of the proximal jejunum (series 2, image 46). There is a small amount of residual contrast within the colon. No extraluminal contrast. Vascular/Lymphatic: Aortic atherosclerosis. No enlarged abdominal or pelvic lymph nodes. Reproductive: Status post hysterectomy. No adnexal masses. Other: Multiple intra-abdominal fluid collections with more well-defined rim enhancement compared to the prior CT. Elongated collection within the periphery of the right upper quadrant resulting in mass effect upon the right hepatic lobe measuring approximately 18.8 x 3.6 x 8.5 cm (volume = 300 cm^3)(series 5, image 45). Ovoid collection within the low pelvis at the midline measures 5.8 x 3.7 x 3.9 cm (volume = 44  cm^3) (  series 2, image 74). Smaller collection along the peripheral margin of the spleen measures up to 7 cm (series 5, image 78). Small collection abutting the medial margin of the left hepatic lobe measures 3.2 x 1.6 cm (series 2, image 32). No free intraperitoneal air. Musculoskeletal: No new or acute bony findings. Lumbar dextrocurvature with multilevel spondylosis. IMPRESSION: 1. Multiple intra-abdominal fluid collections with more well-defined rim enhancement compared to the prior CT. Findings are concerning for abscesses. Largest is along the periphery of the right hepatic lobe and measures nearly 19 cm in length. 2. Mildly dilated loops of small bowel upstream from the anastomotic site slightly decreased in caliber compared to the prior study. Findings may represent a partial small bowel obstruction versus postoperative ileus. 3. There is circumferential wall thickening of the proximal jejunum compatible with enteritis. 4. Small bilateral pleural effusions with dependent bibasilar atelectasis or consolidation, similar to prior. 5. Aortic atherosclerosis (ICD10-I70.0). Electronically Signed   By: Duanne Guess D.O.   On: 08/03/2023 18:48   DG Abd Portable 1V Result Date: 08/03/2023 CLINICAL DATA:  [id number] Ileus, postoperative (HCC) [id number] EXAM: PORTABLE ABDOMEN - 1 VIEW COMPARISON:  07/31/2023 FINDINGS: Enteric tube terminates within the stomach. Slightly increasing size and number of dilated small bowel loops within the abdomen. No gross free intraperitoneal air on supine imaging. IMPRESSION: Slightly increasing size and number of dilated small bowel loops within the abdomen, which may represent ileus or small bowel obstruction. Electronically Signed   By: Duanne Guess D.O.   On: 08/03/2023 12:48   DG CHEST PORT 1 VIEW Result Date: 08/03/2023 CLINICAL DATA:  72 year old female with leukocytosis. Status post abdominal surgery, partial small-bowel resection for perforated closed loop obstruction.  EXAM: PORTABLE CHEST 1 VIEW COMPARISON:  Chest radiographs 01/06/2018. CT Abdomen and Pelvis 07/31/2023. FINDINGS: Portable AP semi upright view at 0458 hours. Enteric tube courses to the abdomen, tip not included. Right side PICC line in place, terminates a cavoatrial junction level. Low lung volumes. Probable ongoing small pleural effusion seen recently on CT. Mild lung base atelectasis. No pneumothorax, pulmonary edema, air bronchograms. Normal cardiac size and mediastinal contours. Paucity of bowel gas in the visible abdomen. No acute osseous abnormality identified. IMPRESSION: 1. Low lung volumes with small pleural effusions and  atelectasis. 2. Right PICC line with no adverse features. Enteric tube courses to the abdomen. Electronically Signed   By: Odessa Fleming M.D.   On: 08/03/2023 08:35   CT ABDOMEN PELVIS W CONTRAST Result Date: 07/31/2023 CLINICAL DATA:  Abdominal pain, post-op. Patient underwent abdominal laparoscopy and partial small bowel resection for perforated closed loop bowel obstruction and jejunal ischemia on 07/26/2023. EXAM: CT ABDOMEN AND PELVIS WITH CONTRAST TECHNIQUE: Multidetector CT imaging of the abdomen and pelvis was performed using the standard protocol following bolus administration of intravenous contrast. RADIATION DOSE REDUCTION: This exam was performed according to the departmental dose-optimization program which includes automated exposure control, adjustment of the mA and/or kV according to patient size and/or use of iterative reconstruction technique. CONTRAST:  OMNIPAQUE IOHEXOL 300 MG/ML  SOLN COMPARISON:  Abdominopelvic CT 07/26/2023 FINDINGS: Lower chest: New small bilateral pleural effusions with dependent airspace opacities at both lung bases, right greater than left, likely atelectasis. No basilar pneumothorax. Aortic and coronary artery atherosclerosis noted. Hepatobiliary: Mildly heterogeneous hepatic steatosis. No focal liver lesion or abnormal enhancement. No  evidence of gallstones, gallbladder wall thickening or biliary dilatation. Pancreas: Unremarkable. No pancreatic ductal dilatation or surrounding inflammatory changes. Spleen: Normal in size without focal  abnormality. Adrenals/Urinary Tract: Both adrenal glands appear normal. No evidence of urinary tract calculus, suspicious renal lesion or hydronephrosis. Minimal air within the bladder lumen, probably iatrogenic. No bladder wall thickening or surrounding inflammation. Stomach/Bowel: Enteric tube extends into the distal stomach. Enteric contrast was administered and extends into the distal small bowel, the on the small bowel anastomosis in the right false pelvis. There is mild distension of the stomach, duodenum and proximal jejunum with scattered air-fluid levels but no significant wall thickening. Beyond the anastomosis, the small bowel is normal in caliber but does contain contrast. No evidence of contrast leak. The colon is decompressed without evidence of acute inflammation. Scattered diverticulosis of the sigmoid colon. Vascular/Lymphatic: There are no enlarged abdominal or pelvic lymph nodes. Aortic and branch vessel atherosclerosis without evidence of aneurysm or large vessel occlusion. The portal, superior mesenteric and splenic veins are patent. Reproductive: Status post hysterectomy. No evidence of adnexal mass. Other: Small interloop fluid collection in the right false pelvis measuring 2.7 x 1.1 cm on image 57/2. Small to moderate amount of ascites has mildly increased in volume compared with the previous CT. Most of the fluid is around the liver and in the pelvis. There is mild adjacent thin peritoneal enhancement without organized fluid collection to suggest an abscess. No evidence of pneumoperitoneum or contrast extravasation. Postsurgical changes in the anterior abdominal wall. Musculoskeletal: No acute or significant osseous findings. Stable multilevel spondylosis associated with a convex right  scoliosis. IMPRESSION: 1. Mild distension of the stomach, duodenum and proximal jejunum with scattered air-fluid levels, likely postoperative ileus. No evidence of contrast leak. 2. Small to moderate amount of ascites has mildly increased in volume compared with the previous CT. There is mild adjacent thin peritoneal enhancement without organized fluid collection to suggest an abscess. 3. New small bilateral pleural effusions with dependent airspace opacities at both lung bases, likely atelectasis. 4. No acute vascular findings. 5.  Aortic Atherosclerosis (ICD10-I70.0). Electronically Signed   By: Carey Bullocks M.D.   On: 07/31/2023 16:28   [place] EKG SITE RITE Result Date: 07/31/2023 If Site Rite image not attached, placement could not be confirmed due to current cardiac rhythm.  DG Abd Portable 1V Result Date: 07/30/2023 CLINICAL DATA:  Nasogastric tube placement. EXAM: PORTABLE ABDOMEN - 1 VIEW COMPARISON:  07/30/2023 at 1104 hours. FINDINGS: Nasogastric tube terminates in the gastric antrum. Persistent gaseous distention of small bowel in the mid abdomen. IMPRESSION: 1. Satisfactory nasogastric tube placement. 2. Persistent small bowel obstruction. Electronically Signed   By: Leanna Battles M.D.   On: 07/30/2023 15:00   DG Abd Portable 1V Result Date: 07/30/2023 CLINICAL DATA:  Nausea and vomiting. EXAM: PORTABLE ABDOMEN - 1 VIEW COMPARISON:  CT abdomen pelvis 07/26/2023. FINDINGS: Present gaseous distention of small bowel. No colonic gas. Air and fluid in the stomach. No unexpected radiopaque calculi. Dextroconvex scoliosis of the lumbar spine with multilevel degenerative changes. IMPRESSION: Persistent small bowel obstruction. Electronically Signed   By: Leanna Battles M.D.   On: 07/30/2023 14:59   CT ABDOMEN PELVIS W CONTRAST Addendum Date: 07/26/2023 ADDENDUM REPORT: 07/26/2023 17:29 ADDENDUM: Critical Value/emergent results were called by telephone at the time of interpretation on 07/26/2023 at  5:29 pm to Dr. Beckey Downing, Who verbally acknowledged these results. Electronically Signed   By: Jules Schick M.D.   On: 07/26/2023 17:29   Result Date: 07/26/2023 CLINICAL DATA:  Abdominal pain, acute, nonlocalized. EXAM: CT ABDOMEN AND PELVIS WITH CONTRAST TECHNIQUE: Multidetector CT imaging of the abdomen and pelvis  was performed using the standard protocol following bolus administration of intravenous contrast. RADIATION DOSE REDUCTION: This exam was performed according to the departmental dose-optimization program which includes automated exposure control, adjustment of the mA and/or kV according to patient size and/or use of iterative reconstruction technique. CONTRAST:  OMNIPAQUE IOHEXOL 300 MG/ML  SOLN COMPARISON:  CT scan abdomen and pelvis from 04/29/2004. FINDINGS: Lower chest: There are dependent changes in the visualized lung bases. No overt consolidation. No pleural effusion. The heart is normal in size. No pericardial effusion. Hepatobiliary: The liver is normal in size. Non-cirrhotic configuration. No suspicious mass. These is mild diffuse hepatic steatosis. No intrahepatic or extrahepatic bile duct dilation. No calcified gallstones. Normal gallbladder wall thickness. No pericholecystic inflammatory changes. Pancreas: Unremarkable. No pancreatic ductal dilatation or surrounding inflammatory changes. Spleen: Within normal limits. No focal lesion. Adrenals/Urinary Tract: Adrenal glands are unremarkable. No suspicious renal mass. No hydronephrosis. No renal or ureteric calculi. Unremarkable urinary bladder. Stomach/Bowel: There is closed loop obstruction of the several loops of distal small bowel with 2 points of transition in the right lower quadrant (series 2, images 48 and 57). The bowel loops are dilated measuring up to 3.6 cm in diameter. There is significant perienteric fat stranding/fluid and prominence of vasa recta. Wall of 1 of the bowel loop (series 7, image 35) does not enhance  similar to the other dilated bowel loops and highly concerning for ischemia. There is no pneumoperitoneum, pneumatosis or portal venous gas. Vascular/Lymphatic: There is small amount of inter bowel free fluid as well as free fluid in the dependent pelvis and left paracolic gutter, likely reactive. No abdominal or pelvic lymphadenopathy, by size criteria. No aneurysmal dilation of the major abdominal arteries. There are moderate peripheral atherosclerotic vascular calcifications of the aorta and its major branches. Reproductive: The uterus is surgically absent. No large adnexal mass. Other: There is a tiny fat containing umbilical hernia. The soft tissues and abdominal wall are otherwise unremarkable. Musculoskeletal: No suspicious osseous lesions. There are moderate multilevel degenerative changes in the visualized spine. IMPRESSION: 1. Findings favor closed loop obstruction of several loops of distal small bowel, as described above, which may be due to underlying adhesions. 2. One of the dilated/obstructed bowel loop wall is not enhancing similar to the other bowel loops and highly concerning for underlying bowel wall ischemia. 3. Small reactive ascites. No pneumoperitoneum, pneumatosis or portal venous gas. 4. Multiple other nonacute observations, as described above. Electronically Signed: By: Jules Schick M.D. On: 07/26/2023 17:25    Microbiology: Results for orders placed or performed during the hospital encounter of 07/26/23  Blood culture (routine x 2)     Status: None   Collection Time: 07/26/23  9:16 PM   Specimen: BLOOD  Result Value Ref Range Status   Specimen Description   Final    BLOOD BLOOD RIGHT HAND Performed at [facility], [address], [place], [place] [address]    Special Requests   Final    BOTTLES DRAWN AEROBIC AND ANAEROBIC Blood Culture results may not be optimal due to an inadequate volume of blood received in culture bottles Performed at [facility], [address], [place], [place] [address]    Culture   Final    NO GROWTH 5 DAYS Performed at [facility], [address], [place], [place] [address]    Report Status 07/31/2023 FINAL  Final  Blood culture (routine x 2)     Status: None  Collection Time: 07/26/23  9:16 PM   Specimen: BLOOD  Result Value Ref Range Status   Specimen Description   Final    BLOOD RIGHT ANTECUBITAL Performed at [facility], [address], [place], [place] [address]    Special Requests   Final    BOTTLES DRAWN AEROBIC AND ANAEROBIC Blood Culture adequate volume Performed at [facility] [address] [place], [place] [address]    Culture   Final    NO GROWTH 5 DAYS Performed at [facility], [address], [place], [place] [address]    Report Status 07/31/2023 FINAL  Final  Culture, blood (Routine X 2) w Reflex to ID Panel     Status: None   Collection Time: 08/02/23  4:08 PM   Specimen: BLOOD LEFT ARM  Result Value Ref Range Status   Specimen Description   Final    BLOOD LEFT ARM Performed at [facility], [address] [place], [place] [address]    Special Requests   Final    BOTTLES DRAWN AEROBIC AND ANAEROBIC Blood Culture results may not be optimal due to an inadequate volume of blood received in culture bottles Performed at [facility], [address] [place], [place] [address]    Culture   Final    NO GROWTH 5 DAYS Performed at [facility], [address], [place], [place] [address]    Report Status 08/07/2023 FINAL  Final  Culture, blood (Routine X 2) w Reflex to ID Panel     Status: None   Collection Time: 08/02/23  4:16 PM   Specimen: BLOOD RIGHT HAND  Result Value Ref Range Status   Specimen Description   Final    BLOOD RIGHT HAND Performed at [facility], [address], [place], [place] [address]    Special Requests   Final    BOTTLES DRAWN AEROBIC AND  ANAEROBIC Blood Culture results may not be optimal due to an inadequate volume of blood received in culture bottles Performed at [facility], [address], [place], [place] [address]    Culture   Final    NO GROWTH 5 DAYS Performed at [facility], [address], [place], [place] [address]    Report Status 08/07/2023 FINAL  Final  Aerobic/Anaerobic Culture w Gram Stain (surgical/deep wound)     Status: None (Preliminary result)   Collection Time: 08/06/23  1:49 PM   Specimen: Abscess  Result Value Ref Range Status   Specimen Description   Final    ABSCESS Performed at [facility], [address], [place], [place] [address]    Special Requests   Final    Normal Performed at [facility], [address], [place], [place] [address]    Gram Stain   Final    ABUNDANT WBC PRESENT, PREDOMINANTLY PMN RARE BUDDING YEAST SEEN    Culture   Final    RARE CANDIDA ALBICANS Sent to [facility] for further susceptibility testing. NO ANAEROBES ISOLATED Performed at [facility], [address] [place], [place] [address]    Report Status PENDING  Incomplete  Aerobic/Anaerobic Culture w Gram Stain (surgical/deep wound)     Status: None   Collection Time: 08/06/23  1:50 PM   Specimen: Abscess  Result Value Ref Range Status   Specimen Description   Final    ABSCESS Performed at [facility],  [address], [place], [place] [address]    Special Requests   Final    Normal Performed at [facility], [address] [place], [place] [address]    Gram Stain   Final    FEW WBC PRESENT,BOTH PMN AND MONONUCLEAR NO ORGANISMS SEEN    Culture   Final    No growth aerobically or anaerobically. Performed at [facility], [address], [place], [place] [address]    Report Status 08/11/2023 FINAL  Final   Labs: CBC: Recent Labs  Lab 08/06/23 0231 08/07/23 0227 08/08/23 0346  08/09/23 0301 08/10/23 0346 08/11/23 0306 08/12/23 0313  WBC 24.8*   < > 17.0* 17.9* 18.3* 17.9* 16.0*  NEUTROABS 19.8*  --   --   --  13.0* 12.6* 10.7*  HGB 9.1*   < > 8.6* 8.6* 8.6* 9.1* 8.3*  HCT 28.7*   < > 27.2* 26.7* 27.5* 27.8* 25.8*  MCV 98.6   < > 100.0 97.8 99.3 96.9 96.6  PLT 627*   < > 746* 828* 856* 873* 816*   < > = values in this interval not displayed.   Basic Metabolic Panel: Recent Labs  Lab 08/06/23 0231 08/07/23 0227 08/08/23 0346 08/09/23 0301 08/10/23 0346 08/11/23 0306 08/12/23 0313  NA 127*   < > 132* 133* 132* 135 136  K 3.4*   < > 3.8 3.9 3.8 3.6 3.6  CL 97*   < > 103 100 98 98 98  CO2 24   < > 23 24 27 29 29   GLUCOSE 125*   < > 142* 98 101* 98 101*  BUN 16   < > 17 15 12 15  30*  CREATININE 0.50   < > 0.47 0.56 0.57 0.65 0.49  CALCIUM 9.2   < > 9.3 9.5 9.6 10.1 10.3  MG 2.4   < > 2.0 2.0 2.0 2.1 2.2  PHOS 2.5  --   --   --  4.0 4.1 4.5   < > = values in this interval not displayed.   Liver Function Tests: Recent Labs  Lab 08/08/23 0346 08/09/23 0301 08/10/23 0346 08/11/23 0306 08/12/23 0313  AST 36 34 39 35 24  ALT 81* 74* 74* 74* 57*  ALKPHOS 211* 202* 205* 199* 167*  BILITOT 0.6 0.4 0.4 0.4 0.3  PROT 6.3* 6.0* 5.9* 6.6 6.1*  ALBUMIN 2.2* 2.2* 2.2* 2.4* 2.3*   CBG: Recent Labs  Lab 08/09/23 1141 08/09/23 2001 08/11/23 1218 08/11/23 1745 08/12/23 1209  GLUCAP 106* 136* 110* 139* 148*   Discharge time spent: greater than 30 minutes.  Signed: Marguerita Merles, DO [facility] 08/12/2023

## 2023-08-12 NOTE — TOC Transition Note (Addendum)
Transition of Care [facility]) - Discharge Note   Patient Details  Name: Laura Bradshaw MRN: [id number] Date of Birth: 01-Dec-1950  Transition of Care [facility]) CM/SW Contact:  Adrian Prows, RN Phone Number: 08/12/2023, 1:15 PM   Clinical Narrative:    D/C orders received; pt has HH services w/ [facility], and wound vac w/ [facility]; LVMs for [facility] at [facility]  and [place] at [facility] for notification of pt's d/c; wound vac and RW have been delivered to pt's room per previous TOC note; no TOC needs   -[phone]- call back by [facility]; he was notified that pt will d/c today  Final next level of care: Home w Home Health Services Barriers to Discharge: No Barriers Identified   Patient Goals and CMS Choice Patient states their goals for this hospitalization and ongoing recovery are:: home CMS Medicare.gov Compare Post Acute Care list provided to:: Patient        Discharge Placement                       Discharge Plan and Services Additional resources added to the After Visit Summary for     Discharge Planning Services: CM Consult Post Acute Care Choice: Home Health          DME Arranged: Vac DME Agency: [facility] Date DME Agency Contacted: 07/30/23 Time DME Agency Contacted: 1356 Representative spoke with at DME Agency: [facility] HH Arranged: PT, OT, RN [facility] Agency: [facility] Date [facility] Agency Contacted: 07/30/23 Time HH Agency Contacted: 1356 Representative spoke with at [facility] Agency: Cindie  Social Drivers of Health (SDOH) Interventions SDOH Screenings   Food Insecurity: No Food Insecurity (07/29/2023)  Housing: Low Risk  (07/29/2023)  Transportation Needs: No Transportation Needs (07/29/2023)  Utilities: Not At Risk (07/27/2023)  Financial Resource Strain: Low Risk  (06/28/2023)   Received from [facility]  Physical Activity: Unknown (06/28/2023)   Received from [facility]  Social Connections: Somewhat Isolated (06/28/2023)   Received from [facility]  Stress: Stress Concern Present  (06/28/2023)   Received from [facility]  Tobacco Use: High Risk (08/04/2023)     Readmission Risk Interventions     No data to display

## 2023-08-13 ENCOUNTER — Telehealth (HOSPITAL_COMMUNITY): Payer: Self-pay | Admitting: Pharmacy Technician

## 2023-08-13 NOTE — Telephone Encounter (Signed)
Pharmacy Patient Advocate Encounter  Received notification from [facility] that Prior Authorization for Lidocaine 5% patches  has been APPROVED from 08/13/2023 to 08/20/2024   PA #/Case ID/Reference #: [id number]

## 2023-08-13 NOTE — Telephone Encounter (Signed)
Pharmacy Patient Advocate Encounter   Received notification from Fax that prior authorization for Lidocaine 5% patches is required/requested.   Insurance verification completed.   The patient is insured through [employer] .   Per test claim: PA required; PA submitted to above mentioned insurance via CoverMyMeds Key/confirmation #/EOC [id number] Status is pending

## 2023-08-14 ENCOUNTER — Telehealth: Payer: Self-pay | Admitting: Internal Medicine

## 2023-08-14 NOTE — Telephone Encounter (Signed)
HOSPITAL MEDICINE TELEPHONE EVENT NOTE    Contacted by pharmacy as patient was discharged home with a printed but unsigned prescription for topical nystatin.   They contacted me to notify me the prescription was unsigned and furthermore they did not have the specific brand of topical nystatin prescribed.  Chart reviewed, there did not seem to be any supporting documentation surrounding the indication or instructions surrounding topical nystatin.  I therefore advised pharmacy to dispose of the prescription and advised that if the patient inquires about it that the patient should follow-up with her primary care provider.  Marinda Elk  MD [facility]

## 2023-08-15 LAB — ANTIFUNGAL AST 9 DRUG PANEL
Amphotericin B MIC: 0.5
Fluconazole Islt MIC: 1
Flucytosine MIC: 0.5
Itraconazole MIC: 0.12
Posaconazole MIC: 0.03

## 2023-08-16 LAB — AEROBIC/ANAEROBIC CULTURE W GRAM STAIN (SURGICAL/DEEP WOUND): Special Requests: NORMAL

## 2023-08-29 ENCOUNTER — Ambulatory Visit: Payer: Medicare PPO | Admitting: Internal Medicine

## 2023-08-30 ENCOUNTER — Telehealth: Payer: Self-pay

## 2023-08-30 ENCOUNTER — Other Ambulatory Visit: Payer: Self-pay

## 2023-08-30 ENCOUNTER — Encounter: Payer: Self-pay | Admitting: Internal Medicine

## 2023-08-30 ENCOUNTER — Other Ambulatory Visit: Payer: Self-pay | Admitting: Internal Medicine

## 2023-08-30 ENCOUNTER — Ambulatory Visit (INDEPENDENT_AMBULATORY_CARE_PROVIDER_SITE_OTHER): Payer: Medicare HMO | Admitting: Internal Medicine

## 2023-08-30 VITALS — BP 132/83 | HR 87 | Wt 138.0 lb

## 2023-08-30 DIAGNOSIS — K650 Generalized (acute) peritonitis: Secondary | ICD-10-CM

## 2023-08-30 DIAGNOSIS — N739 Female pelvic inflammatory disease, unspecified: Secondary | ICD-10-CM

## 2023-08-30 DIAGNOSIS — K651 Peritoneal abscess: Secondary | ICD-10-CM | POA: Diagnosis not present

## 2023-08-30 MED ORDER — FLUCONAZOLE 200 MG PO TABS: 400.0000 mg | ORAL_TABLET | Freq: Every day | ORAL | 0 refills | Status: AC

## 2023-08-30 MED ORDER — AMOXICILLIN-POT CLAVULANATE 875-125 MG PO TABS: 1.0000 | ORAL_TABLET | Freq: Two times a day (BID) | ORAL | 0 refills | Status: AC

## 2023-08-30 NOTE — Telephone Encounter (Signed)
 Per Dr. Dennise called IR to schedule appt with IR to follow up on drains. Spoke with Tiffany who states no orders are in. Provided me with number for drain clinic. Left voicemail requesting staff call patient back to schedule follow up. Provided patient and family with number as well.  Dr. Dennise will reach out to radiologist that saw pt during admission.  P:[phone] Lorenda CHRISTELLA Code, RMA

## 2023-08-30 NOTE — Progress Notes (Signed)
 Patient Active Problem List   Diagnosis Date Noted   Intra-abdominal abscess (HCC) 08/06/2023   Malnutrition of moderate degree 07/31/2023   Small bowel obstruction (HCC) 07/26/2023   HYPERLIPIDEMIA 04/04/2007   ANXIETY 04/04/2007   DEPRESSION 04/04/2007   HYPERTENSION 04/04/2007   DEGENERATIVE DISC DISEASE 04/04/2007   LOW BACK PAIN, CHRONIC 04/04/2007    Patient's Medications  New Prescriptions   No medications on file  Previous Medications   ACETAMINOPHEN  (TYLENOL ) 500 MG TABLET    Take 1,000 mg by mouth as needed for moderate pain (pain score 4-6).   ALBUTEROL  (VENTOLIN  HFA) 108 (90 BASE) MCG/ACT INHALER    Inhale 2 puffs into the lungs every 4 (four) hours as needed for wheezing or shortness of breath.   ALPRAZOLAM  (XANAX ) 0.5 MG TABLET    Take 0.5 mg by mouth in the morning and at bedtime.   AMOXICILLIN -CLAVULANATE (AUGMENTIN ) 875-125 MG TABLET    Take 1 tablet by mouth every 12 (twelve) hours.   ASPIRIN 325 MG TABLET    Take 325 mg by mouth daily.   CYCLOBENZAPRINE  (FLEXERIL ) 10 MG TABLET    Take 10 mg by mouth at bedtime.   DICLOFENAC (VOLTAREN) 75 MG EC TABLET    Take 75 mg by mouth 3 (three) times daily.   ESCITALOPRAM  (LEXAPRO ) 20 MG TABLET    Take 20 mg by mouth daily.   FEEDING SUPPLEMENT (ENSURE ENLIVE / ENSURE PLUS) LIQD    Take 237 mLs by mouth 3 (three) times daily with meals.   FLUCONAZOLE  (DIFLUCAN ) 200 MG TABLET    Take 2 tablets (400 mg total) by mouth at bedtime.   FLUTICASONE  (FLONASE ) 50 MCG/ACT NASAL SPRAY    Place 2 sprays into both nostrils as needed for allergies.   FLUTICASONE -SALMETEROL (ADVAIR) 250-50 MCG/ACT AEPB    Inhale 2 puffs into the lungs as needed (wheezing/SOB).   LIDOCAINE  (LIDODERM ) 5 %    Place 2 patches onto the skin daily. Remove & Discard patch within 12 hours or as directed by MD   LISINOPRIL  (ZESTRIL ) 10 MG TABLET    Take 10 mg by mouth at bedtime.   METHOCARBAMOL  (ROBAXIN ) 500 MG TABLET    Take 1 tablet (500 mg total) by  mouth every 8 (eight) hours as needed for muscle spasms.   MONTELUKAST  (SINGULAIR ) 10 MG TABLET    Take 10 mg by mouth at bedtime.   NICOTINE  (NICODERM CQ  - DOSED IN MG/24 HOURS) 21 MG/24HR PATCH    Place 1 patch (21 mg total) onto the skin daily.   NYSTATIN (GERHARDT'S BUTT CREAM) CREA    Apply 1 Application topically 2 (two) times daily.   ONDANSETRON  (ZOFRAN -ODT) 4 MG DISINTEGRATING TABLET    Take 1 tablet (4 mg total) by mouth every 6 (six) hours as needed for nausea.   OXYCODONE  (OXY IR/ROXICODONE ) 5 MG IMMEDIATE RELEASE TABLET    Take 1 tablet (5 mg total) by mouth every 4 (four) hours as needed (pain).   SIMETHICONE  (MYLICON) 80 MG CHEWABLE TABLET    Chew 0.5 tablets (40 mg total) by mouth every 6 (six) hours as needed for flatulence (bloating).   SIMVASTATIN  (ZOCOR ) 40 MG TABLET    Take 40 mg by mouth daily.   SODIUM CITRATE DIHYDRATE (EMETROL) 230 MG CHEW    Chew 460 mg by mouth as needed (nausea).  Modified Medications   No medications on file  Discontinued Medications   No medications on file  Subjective: 73 year old female with past medical history of as below presents for hospital follow-up of small bowel obstruction status post resection complicated by intra-abdominal abscess.  Patient had 2 JP drains placed during hospitalization 1 which was removed.  Cultures using Candida albicans, patient discharged on fluconazole  and Augmentin  with plan to treat until intra-abdominal abscesses are resolved.  She was getting TPN via PICC during hospitalization.  PICC line has been removed and patient tolerating p.o. intake. Today 08/30/23:  Eating well. RLQ drian present in with drainage that is milky. No missed doses of abx.    Review of Systems: Review of Systems  All other systems reviewed and are negative.   Past Medical History:  Diagnosis Date   Anxiousness    Back pain    Diverticulitis    Environmental allergies    HOH (hard of hearing)    Hypercholesteremia    Hypertension     Restless leg     Social History   Tobacco Use   Smoking status: Every Day    Types: Cigarettes   Smokeless tobacco: Never  Vaping Use   Vaping status: Never Used  Substance Use Topics   Alcohol use: Never   Drug use: Never    No family history on file.  Allergies  Allergen Reactions   Ciprofloxacin Hives   Codeine Other (See Comments)    Welts    Sulfonamide Derivatives Itching    welts   Hydrocodone Anxiety    Health Maintenance  Topic Date Due   Pneumonia Vaccine 42+ Years old (1 of 2 - PCV) Never done   Hepatitis C Screening  Never done   Colonoscopy  Never done   Lung Cancer Screening  01/07/2019   MAMMOGRAM  03/06/2019   Zoster Vaccines- Shingrix (2 of 2) 07/23/2019   COVID-19 Vaccine (3 - Pfizer risk series) 05/01/2020   Medicare Annual Wellness (AWV)  06/27/2024   DTaP/Tdap/Td (3 - Td or Tdap) 06/01/2031   INFLUENZA VACCINE  Completed   DEXA SCAN  Completed   HPV VACCINES  Aged Out    Objective:  Vitals:   08/30/23 1101  BP: 132/83  Pulse: 87  SpO2: 98%  Weight: 138 lb (62.6 kg)   Body mass index is 26.07 kg/m.  Physical Exam Constitutional:      Appearance: Normal appearance.  HENT:     Head: Normocephalic and atraumatic.     Right Ear: Tympanic membrane normal.     Left Ear: Tympanic membrane normal.     Nose: Nose normal.     Mouth/Throat:     Mouth: Mucous membranes are moist.  Eyes:     Extraocular Movements: Extraocular movements intact.     Conjunctiva/sclera: Conjunctivae normal.     Pupils: Pupils are equal, round, and reactive to light.  Cardiovascular:     Rate and Rhythm: Normal rate and regular rhythm.     Heart sounds: No murmur heard.    No friction rub. No gallop.  Pulmonary:     Effort: Pulmonary effort is normal.     Breath sounds: Normal breath sounds.  Abdominal:     General: Abdomen is flat.     Palpations: Abdomen is soft.     Comments: RLQ drain  Musculoskeletal:        General: Normal range of motion.   Skin:    General: Skin is warm and dry.  Neurological:     General: No focal deficit present.     Mental Status: She is  alert and oriented to person, place, and time.  Psychiatric:        Mood and Affect: Mood normal.     Lab Results Lab Results  Component Value Date   WBC 16.0 (H) 08/12/2023   HGB 8.3 (L) 08/12/2023   HCT 25.8 (L) 08/12/2023   MCV 96.6 08/12/2023   PLT 816 (H) 08/12/2023    Lab Results  Component Value Date   CREATININE 0.49 08/12/2023   BUN 30 (H) 08/12/2023   NA 136 08/12/2023   K 3.6 08/12/2023   CL 98 08/12/2023   CO2 29 08/12/2023    Lab Results  Component Value Date   ALT 57 (H) 08/12/2023   AST 24 08/12/2023   ALKPHOS 167 (H) 08/12/2023   BILITOT 0.3 08/12/2023    Lab Results  Component Value Date   CHOL 198 08/06/2007   HDL 50 08/06/2007   LDLCALC 122 (H) 08/06/2007   TRIG 306 (H) 08/06/2023   CHOLHDL 4.0 Ratio 08/06/2007   No results found for: LABRPR, RPRTITER No results found for: HIV1RNAQUANT, HIV1RNAVL, CD4TABS   Problem List Items Addressed This Visit   None  Results          Assessment/Plan #Intra-abdominal abscesses with right lower quadrant JP drain - 12/16 urine cultures grew Calone albicans.  Patient was discharged on fluconazole  and Augmentin  with course to be completed until abscess is resolved. - She still has her drain in with some milky discharge. Plan: -Continue Fluconazole  400mg  PO qd and augmentin  875/125 mg per day(refilled) till noted plan to continue antibiotics till resolution of abscesses -EKG today with qtc 416 -Labs today: cbc, cmp esr and crp  -Radiology called for appt in the setting drain, appt in drain clinic on 09/03/23 -F/U with Dr Fleeta Rothman on 1/28       Loney Stank, MD [facility] 08/30/2023, 11:06 AM   I have personally spent 45 minutes involved in face-to-face and non-face-to-face activities for this patient on the day of  the visit. Professional time spent includes the following activities: Preparing to see the patient (review of tests), Obtaining and/or reviewing separately obtained history (admission/discharge record), Performing a medically appropriate examination and/or evaluation , Ordering medications/tests/procedures, referring and communicating with other health care professionals, Documenting clinical information in the EMR, Independently interpreting results (not separately reported), Communicating results to the patient/family/caregiver, Counseling and educating the patient/family/caregiver and Care coordination (not separately reported).

## 2023-08-31 LAB — COMPLETE METABOLIC PANEL WITH GFR
AG Ratio: 1.2 (calc) (ref 1.0–2.5)
ALT: 45 U/L — ABNORMAL HIGH (ref 6–29)
AST: 32 U/L (ref 10–35)
Albumin: 3.7 g/dL (ref 3.6–5.1)
Alkaline phosphatase (APISO): 152 U/L (ref 37–153)
BUN: 16 mg/dL (ref 7–25)
CO2: 24 mmol/L (ref 20–32)
Calcium: 10.7 mg/dL — ABNORMAL HIGH (ref 8.6–10.4)
Chloride: 100 mmol/L (ref 98–110)
Creat: 0.62 mg/dL (ref 0.60–1.00)
Globulin: 3 g/dL (ref 1.9–3.7)
Glucose, Bld: 82 mg/dL (ref 65–99)
Potassium: 4.9 mmol/L (ref 3.5–5.3)
Sodium: 135 mmol/L (ref 135–146)
Total Bilirubin: 0.3 mg/dL (ref 0.2–1.2)
Total Protein: 6.7 g/dL (ref 6.1–8.1)
eGFR: 94 mL/min/{1.73_m2} (ref >=60)

## 2023-08-31 LAB — C-REACTIVE PROTEIN: CRP: 14.7 mg/L — ABNORMAL HIGH (ref <8.0)

## 2023-08-31 LAB — CBC WITH DIFFERENTIAL/PLATELET
Absolute Lymphocytes: 3148 {cells}/uL (ref 850–3900)
Absolute Monocytes: 1213 {cells}/uL — ABNORMAL HIGH (ref 200–950)
Basophils Absolute: 77 {cells}/uL (ref 0–200)
Basophils Relative: 0.6 %
Eosinophils Absolute: 645 {cells}/uL — ABNORMAL HIGH (ref 15–500)
Eosinophils Relative: 5 %
HCT: 30.7 % — ABNORMAL LOW (ref 35.0–45.0)
Hemoglobin: 9.7 g/dL — ABNORMAL LOW (ref 11.7–15.5)
MCH: 29.3 pg (ref 27.0–33.0)
MCHC: 31.6 g/dL — ABNORMAL LOW (ref 32.0–36.0)
MCV: 92.7 fL (ref 80.0–100.0)
MPV: 10 fL (ref 7.5–12.5)
Monocytes Relative: 9.4 %
Neutro Abs: 7817 {cells}/uL — ABNORMAL HIGH (ref 1500–7800)
Neutrophils Relative %: 60.6 %
Platelets: 776 10*3/uL — ABNORMAL HIGH (ref 140–400)
RBC: 3.31 10*6/uL — ABNORMAL LOW (ref 3.80–5.10)
RDW: 13.1 % (ref 11.0–15.0)
Total Lymphocyte: 24.4 %
WBC: 12.9 10*3/uL — ABNORMAL HIGH (ref 3.8–10.8)

## 2023-08-31 LAB — SEDIMENTATION RATE: Sed Rate: 87 mm/h — ABNORMAL HIGH (ref 0–30)

## 2023-09-02 NOTE — Progress Notes (Signed)
 Referring Physician(s): Dr.  Bernarda Ned   Chief Complaint: The patient is seen in follow up today s/p perihepatic abscess drain placed 08/06/23  History of present illness: Laura Bradshaw is a 73 year old female with a medical history significant for HTN, anxiety/depression, COPD, HSV, low back pain and mild dementia. She presented to the ED 07/26/23 with acute abdominal pain and vomiting. Work up in the ED was notable for leukocytosis and a closed-loop small bowel obstruction with ischemia. She was emergently taken to the OR for an open small bowel resection. Her recovery was complicated by a persistent post-op ileus and leukocytosis. A CT scan 08/03/23 showed multiple intra-abdominal fluid collections with the largest along the periphery of the right hepatic lobe. Interventional Radiology was consulted for assistance and the patient received two 10 Fr. drains 08/06/23 - a perihepatic abscess drain and a pelvic drain via left transgluteal approach.   The patient did well with the drains and the left pelvic drain was removed 08/09/23 by the Surgery team. She was discharged home with home health 08/12/23. She has followed up with Dr. Ned (08/28/23) and Infectious Disease (08/30/23).  She presents today to the Interventional Radiology outpatient clinic for a drain evaluation with CT imaging and possible drain injection under fluoroscopy. She has been doing well.  Minimal output from drain. No new abdominal pain, fevers, or chills.  Past Medical History:  Diagnosis Date   Anxiousness    Back pain    Diverticulitis    Environmental allergies    HOH (hard of hearing)    Hypercholesteremia    Hypertension    Restless leg     Past Surgical History:  Procedure Laterality Date   ABDOMINAL HYSTERECTOMY     APPENDECTOMY     LAPAROSCOPY N/A 07/26/2023   Procedure: LAPAROSCOPY DIAGNOSTIC CONVERTED TO OPEN BOWEL RESECTION;  Surgeon: Ned Bernarda, MD;  Location: [facility]S;  Service: General;   Laterality: N/A;   SHOULDER SURGERY     TONSILLECTOMY      Allergies: Ciprofloxacin, Codeine, Sulfonamide derivatives, and Hydrocodone  Medications: Prior to Admission medications   Medication Sig Start Date End Date Taking? Authorizing Provider  acetaminophen  (TYLENOL ) 500 MG tablet Take 1,000 mg by mouth as needed for moderate pain (pain score 4-6).    [provider]  albuterol  (VENTOLIN  HFA) 108 (90 Base) MCG/ACT inhaler Inhale 2 puffs into the lungs every 4 (four) hours as needed for wheezing or shortness of breath. 07/25/23   [provider]  ALPRAZolam  (XANAX ) 0.5 MG tablet Take 0.5 mg by mouth in the morning and at bedtime.    [provider]  amoxicillin -clavulanate (AUGMENTIN ) 875-125 MG tablet Take 1 tablet by mouth every 12 (twelve) hours. 08/30/23   Singh, Mayanka, MD  aspirin 325 MG tablet Take 325 mg by mouth daily.    [provider]  cyclobenzaprine  (FLEXERIL ) 10 MG tablet Take 10 mg by mouth at bedtime. 06/04/23   [provider]  diclofenac (VOLTAREN) 75 MG EC tablet Take 75 mg by mouth 3 (three) times daily.    [provider]  escitalopram  (LEXAPRO ) 20 MG tablet Take 20 mg by mouth daily.    [provider]  feeding supplement (ENSURE ENLIVE / ENSURE PLUS) LIQD Take 237 mLs by mouth 3 (three) times daily with meals. 08/12/23   Sherrill Cable Latif, DO  fluconazole  (DIFLUCAN ) 200 MG tablet Take 2 tablets (400 mg total) by mouth at bedtime. 08/30/23   Dennise Kingsley, MD  fluticasone  (FLONASE )  50 MCG/ACT nasal spray Place 2 sprays into both nostrils as needed for allergies. 07/25/23   [provider]  fluticasone -salmeterol (ADVAIR) 250-50 MCG/ACT AEPB Inhale 2 puffs into the lungs as needed (wheezing/SOB). 07/25/23   [provider]  lidocaine  (LIDODERM ) 5 % Place 2 patches onto the skin daily. Remove & Discard patch within 12 hours or as directed by MD 08/13/23   Sheikh, Omair Latif, DO  lisinopril   (ZESTRIL ) 10 MG tablet Take 10 mg by mouth at bedtime.    [provider]  methocarbamol  (ROBAXIN ) 500 MG tablet Take 1 tablet (500 mg total) by mouth every 8 (eight) hours as needed for muscle spasms. 08/10/23   Tammy Sor, PA-C  montelukast  (SINGULAIR ) 10 MG tablet Take 10 mg by mouth at bedtime.    [provider]  nicotine  (NICODERM CQ  - DOSED IN MG/24 HOURS) 21 mg/24hr patch Place 1 patch (21 mg total) onto the skin daily. 08/13/23   Sheikh, Omair Latif, DO  Nystatin (GERHARDT'S BUTT CREAM) CREA Apply 1 Application topically 2 (two) times daily. 08/12/23   Sheikh, Omair Latif, DO  ondansetron  (ZOFRAN -ODT) 4 MG disintegrating tablet Take 1 tablet (4 mg total) by mouth every 6 (six) hours as needed for nausea. 08/12/23   Sherrill Cable Latif, DO  oxyCODONE  (OXY IR/ROXICODONE ) 5 MG immediate release tablet Take 1 tablet (5 mg total) by mouth every 4 (four) hours as needed (pain). 08/10/23   Tammy Sor, PA-C  simethicone  (MYLICON) 80 MG chewable tablet Chew 0.5 tablets (40 mg total) by mouth every 6 (six) hours as needed for flatulence (bloating). 08/12/23   Sherrill Cable Latif, DO  simvastatin  (ZOCOR ) 40 MG tablet Take 40 mg by mouth daily.    [provider]  Sodium Citrate Dihydrate (EMETROL) 230 MG CHEW Chew 460 mg by mouth as needed (nausea).    [provider]     No family history on file.  Social History   Socioeconomic History   Marital status: Single    Spouse name: Not on file   Number of children: Not on file   Years of education: Not on file   Highest education level: Not on file  Occupational History   Not on file  Tobacco Use   Smoking status: Every Day    Types: Cigarettes   Smokeless tobacco: Never  Vaping Use   Vaping status: Never Used  Substance and Sexual Activity   Alcohol use: Never   Drug use: Never   Sexual activity: Not on file  Other Topics Concern   Not on file  Social History Narrative   Not on file    Social Drivers of Health   Financial Resource Strain: Low Risk  (06/28/2023)   Received from [facility]   Overall Financial Resource Strain (CARDIA)    Difficulty of Paying Living Expenses: Not very hard  Food Insecurity: No Food Insecurity (07/29/2023)   Hunger Vital Sign    Worried About Running Out of Food in the Last Year: Never true    Ran Out of Food in the Last Year: Never true  Transportation Needs: No Transportation Needs (07/29/2023)   PRAPARE - Administrator, Civil Service (Medical): No    Lack of Transportation (Non-Medical): No  Physical Activity: Unknown (06/28/2023)   Received [facility]   Exercise Vital Sign    Days of Exercise per Week: 0 days    Minutes of Exercise per Session: Not on file  Stress: Stress Concern Present (  06/28/2023)   Received from [facility] of Occupational Health - Occupational Stress Questionnaire    Feeling of Stress : Rather much  Social Connections: Somewhat Isolated (06/28/2023)   Received from [facility]   Social Network    How would you rate your social network (family, work, friends)?: Restricted participation with some degree of social isolation     Vital Signs: There were no vitals taken for this visit.  Physical Exam Constitutional:      General: She is not in acute distress. HENT:     Head: Normocephalic.     Mouth/Throat:     Mouth: Mucous membranes are moist.  Eyes:     General: No scleral icterus. Cardiovascular:     Rate and Rhythm: Normal rate and regular rhythm.  Pulmonary:     Effort: Pulmonary effort is normal. No respiratory distress.  Abdominal:     General: There is no distension.     Comments: RUQ drain in place with trace serous fluid in bulb.  Skin:    General: Skin is warm and dry.     Coloration: Skin is not jaundiced.  Neurological:     Mental Status: She is alert and oriented to person, place, and time.     Imaging: CT AP 09/03/23 1. Complete  resolution of previously visualized pelvic fluid collection. 2. Right upper quadrant pigtail drainage catheter in place along the lateral aspect of the right lobe of the liver without appreciable residual fluid collection.   Drain injection 09/03/23 No bilious or enteric fistula.  Drain removed.  Labs:  CBC: Recent Labs    08/10/23 0346 08/11/23 0306 08/12/23 0313 08/30/23 1119  WBC 18.3* 17.9* 16.0* 12.9*  HGB 8.6* 9.1* 8.3* 9.7*  HCT 27.5* 27.8* 25.8* 30.7*  PLT 856* 873* 816* 776*    COAGS: Recent Labs    08/05/23 0253  INR 1.1    BMP: Recent Labs    08/09/23 0301 08/10/23 0346 08/11/23 0306 08/12/23 0313 08/30/23 1119  NA 133* 132* 135 136 135  K 3.9 3.8 3.6 3.6 4.9  CL 100 98 98 98 100  CO2 24 27 29 29 24   GLUCOSE 98 101* 98 101* 82  BUN 15 12 15  30* 16  CALCIUM 9.5 9.6 10.1 10.3 10.7*  CREATININE 0.56 0.57 0.65 0.49 0.62  GFRNONAA >60 >60 >60 >60  --     LIVER FUNCTION TESTS: Recent Labs    08/09/23 0301 08/10/23 0346 08/11/23 0306 08/12/23 0313 08/30/23 1119  BILITOT 0.4 0.4 0.4 0.3 0.3  AST 34 39 35 24 32  ALT 74* 74* 74* 57* 45*  ALKPHOS 202* 205* 199* 167*  --   PROT 6.0* 5.9* 6.6 6.1* 6.7  ALBUMIN  2.2* 2.2* 2.4* 2.3*  --     Assessment and Plan: 73 year old female with a history of small bowel obstruction s/p open resection complicated post-operatively by intra-abdominal fluid collections necessitating drain placements in IR.  The transgluteal drain has been removed.  Today her CT demonstrates resolution of pelvic and perihepatic fluid collections.  Drain injection ruled out any fistulous connections with biliary tree or bowel.  The drain was removed.  Follow up with IR as needed.  Ester Sides, MD Pager: [phone]    I spent a total of 25 Minutes in face to face in clinical consultation, greater than 50% of which was counseling/coordinating care for perihepatic abscess drain.

## 2023-09-03 ENCOUNTER — Ambulatory Visit
Admission: RE | Admit: 2023-09-03 | Discharge: 2023-09-03 | Disposition: A | Discharge status: Home / Self Care | Payer: Medicare HMO | Source: Ambulatory Visit | Attending: Internal Medicine | Admitting: Internal Medicine

## 2023-09-03 DIAGNOSIS — K650 Generalized (acute) peritonitis: Secondary | ICD-10-CM

## 2023-09-03 DIAGNOSIS — N739 Female pelvic inflammatory disease, unspecified: Secondary | ICD-10-CM

## 2023-09-03 HISTORY — PX: IR RADIOLOGIST EVAL & MGMT: IMG5224

## 2023-09-03 MED ORDER — IOPAMIDOL (ISOVUE-300) INJECTION 61%
100.0000 mL | Freq: Once | INTRAVENOUS | Status: AC | PRN
  Administered 2023-09-03: 100 mL via INTRAVENOUS

## 2023-09-05 ENCOUNTER — Telehealth: Payer: Self-pay

## 2023-09-05 NOTE — Telephone Encounter (Signed)
 Patient's daughter called wanting to know how long patient should remain on Augmentin  and fluconazole .   Reports drains were removed 1/13 and that they were told CT scan looks good.   Patient has 6 days of fluconazole  left at home and more than 6 days worth of Augmentin . They were wondering if they needed to pick up a refill. Provider follow up is scheduled for 1/28.   Will route to provider.   Tazia Illescas D Jshon Ibe, RN

## 2023-09-07 ENCOUNTER — Encounter: Payer: Self-pay | Admitting: Internal Medicine

## 2023-09-10 NOTE — Telephone Encounter (Signed)
Leann returned call. Relayed per Dr. Thedore Mins that okay to stop antibiotics as long as drain has been removed. Reminded her of upcoming follow up with Dr. Daiva Eves.   Sandie Ano, RN

## 2023-09-10 NOTE — Telephone Encounter (Signed)
Called patient's daughter to relay provider's recommendation, no answer. Left HIPAA compliant voicemail requesting callback.   Sandie Ano, RN

## 2023-09-18 ENCOUNTER — Ambulatory Visit: Payer: Medicare HMO | Admitting: Infectious Disease
